# Patient Record
Sex: Female | Born: 1955 | Race: White | Hispanic: No | Marital: Married | Smoking: Never smoker
Health system: Southern US, Community
[De-identification: ages and names within clinical notes are randomized; demographics above are authoritative.]

## PROBLEM LIST (undated history)

## (undated) DIAGNOSIS — E7800 Pure hypercholesterolemia, unspecified: Secondary | ICD-10-CM

## (undated) DIAGNOSIS — N183 Chronic kidney disease, stage 3 unspecified: Secondary | ICD-10-CM

## (undated) DIAGNOSIS — S42309A Unspecified fracture of shaft of humerus, unspecified arm, initial encounter for closed fracture: Secondary | ICD-10-CM

## (undated) DIAGNOSIS — C539 Malignant neoplasm of cervix uteri, unspecified: Secondary | ICD-10-CM

## (undated) DIAGNOSIS — R7309 Other abnormal glucose: Secondary | ICD-10-CM

## (undated) DIAGNOSIS — K449 Diaphragmatic hernia without obstruction or gangrene: Secondary | ICD-10-CM

## (undated) DIAGNOSIS — E0789 Other specified disorders of thyroid: Secondary | ICD-10-CM

## (undated) DIAGNOSIS — T7840XA Allergy, unspecified, initial encounter: Secondary | ICD-10-CM

## (undated) DIAGNOSIS — K2100 Gastro-esophageal reflux disease with esophagitis, without bleeding: Secondary | ICD-10-CM

## (undated) DIAGNOSIS — E559 Vitamin D deficiency, unspecified: Secondary | ICD-10-CM

## (undated) DIAGNOSIS — S92909A Unspecified fracture of unspecified foot, initial encounter for closed fracture: Secondary | ICD-10-CM

## (undated) DIAGNOSIS — G4733 Obstructive sleep apnea (adult) (pediatric): Secondary | ICD-10-CM

## (undated) DIAGNOSIS — G4730 Sleep apnea, unspecified: Secondary | ICD-10-CM

## (undated) DIAGNOSIS — E876 Hypokalemia: Secondary | ICD-10-CM

## (undated) DIAGNOSIS — E041 Nontoxic single thyroid nodule: Secondary | ICD-10-CM

## (undated) DIAGNOSIS — Z9989 Dependence on other enabling machines and devices: Secondary | ICD-10-CM

## (undated) HISTORY — DX: Unspecified fracture of unspecified foot, initial encounter for closed fracture: S92.909A

## (undated) HISTORY — PX: GANGLION CYST EXCISION: SHX1691

## (undated) HISTORY — DX: Other specified disorders of thyroid: E07.89

## (undated) HISTORY — PX: TOTAL VAGINAL HYSTERECTOMY: SHX2548

## (undated) HISTORY — PX: FRACTURE SURGERY: SHX138

## (undated) HISTORY — DX: Hypokalemia: E87.6

## (undated) HISTORY — DX: Gastro-esophageal reflux disease with esophagitis: K21.0

## (undated) HISTORY — DX: Diaphragmatic hernia without obstruction or gangrene: K44.9

## (undated) HISTORY — PX: GALLBLADDER SURGERY: SHX652

## (undated) HISTORY — DX: Nontoxic single thyroid nodule: E04.1

## (undated) HISTORY — DX: Gastro-esophageal reflux disease with esophagitis, without bleeding: K21.00

## (undated) HISTORY — DX: Dependence on other enabling machines and devices: Z99.89

## (undated) HISTORY — DX: Unspecified fracture of shaft of humerus, unspecified arm, initial encounter for closed fracture: S42.309A

## (undated) HISTORY — DX: Other abnormal glucose: R73.09

## (undated) HISTORY — DX: Chronic kidney disease, stage 3 (moderate): N18.3

## (undated) HISTORY — DX: Sleep apnea, unspecified: G47.30

## (undated) HISTORY — PX: ELBOW FRACTURE SURGERY: SHX616

## (undated) HISTORY — DX: Chronic kidney disease, stage 3 unspecified: N18.30

## (undated) HISTORY — DX: Pure hypercholesterolemia, unspecified: E78.00

## (undated) HISTORY — PX: ABDOMINAL HYSTERECTOMY: SHX81

## (undated) HISTORY — DX: Obstructive sleep apnea (adult) (pediatric): G47.33

## (undated) HISTORY — DX: Allergy, unspecified, initial encounter: T78.40XA

## (undated) HISTORY — DX: Vitamin D deficiency, unspecified: E55.9

## (undated) HISTORY — DX: Malignant neoplasm of cervix uteri, unspecified: C53.9

## (undated) HISTORY — PX: CHOLECYSTECTOMY: SHX55

---

## 2000-11-01 ENCOUNTER — Other Ambulatory Visit: Admission: RE | Admit: 2000-11-01 | Discharge: 2000-11-01 | Payer: Self-pay | Admitting: Family Medicine

## 2002-11-10 ENCOUNTER — Emergency Department (HOSPITAL_COMMUNITY): Admission: EM | Admit: 2002-11-10 | Discharge: 2002-11-11 | Payer: Self-pay | Admitting: Emergency Medicine

## 2002-11-11 ENCOUNTER — Encounter: Payer: Self-pay | Admitting: Emergency Medicine

## 2004-04-23 ENCOUNTER — Ambulatory Visit: Payer: Self-pay

## 2005-06-17 ENCOUNTER — Encounter (INDEPENDENT_AMBULATORY_CARE_PROVIDER_SITE_OTHER): Payer: Self-pay | Admitting: Specialist

## 2005-06-17 ENCOUNTER — Ambulatory Visit (HOSPITAL_COMMUNITY): Admission: RE | Admit: 2005-06-17 | Discharge: 2005-06-17 | Payer: Self-pay | Admitting: Gynecology

## 2005-07-15 ENCOUNTER — Encounter (INDEPENDENT_AMBULATORY_CARE_PROVIDER_SITE_OTHER): Payer: Self-pay | Admitting: *Deleted

## 2005-07-15 ENCOUNTER — Observation Stay (HOSPITAL_COMMUNITY): Admission: RE | Admit: 2005-07-15 | Discharge: 2005-07-16 | Payer: Self-pay | Admitting: Gynecology

## 2005-08-15 ENCOUNTER — Ambulatory Visit: Payer: Self-pay | Admitting: Gynecology

## 2007-02-20 ENCOUNTER — Ambulatory Visit: Payer: Self-pay

## 2007-04-17 ENCOUNTER — Ambulatory Visit (HOSPITAL_COMMUNITY): Admission: RE | Admit: 2007-04-17 | Discharge: 2007-04-17 | Payer: Self-pay | Admitting: Cardiovascular Disease

## 2007-09-12 ENCOUNTER — Emergency Department: Payer: Self-pay | Admitting: Emergency Medicine

## 2007-09-19 ENCOUNTER — Ambulatory Visit: Payer: Self-pay | Admitting: Orthopedic Surgery

## 2008-09-23 ENCOUNTER — Ambulatory Visit: Payer: Self-pay | Admitting: Family Medicine

## 2008-09-30 ENCOUNTER — Ambulatory Visit: Payer: Self-pay

## 2008-10-07 ENCOUNTER — Ambulatory Visit: Payer: Self-pay

## 2008-11-13 LAB — HM COLONOSCOPY

## 2008-11-14 ENCOUNTER — Ambulatory Visit: Payer: Self-pay | Admitting: Unknown Physician Specialty

## 2009-06-23 ENCOUNTER — Ambulatory Visit: Payer: Self-pay | Admitting: Family Medicine

## 2010-02-11 IMAGING — NM NM BONE LIMITED
1 series · 8 of 8 positions shown · non-contrast
Comparison: none

REASON FOR EXAM: Injury Tail Bone Pain  Pt Fell  addiontional per
radiologist r/s per incompl...
COMMENTS:

[Series 1000: bone statics · 2.40mm/px · 4 acquisitions, 8 frames shown]
[im 1/4]
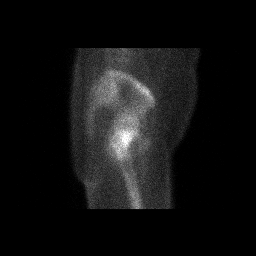
[im 1/4]
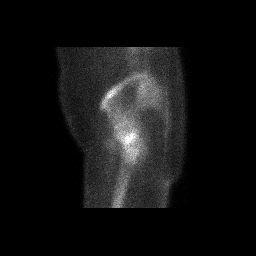
[im 2/4]
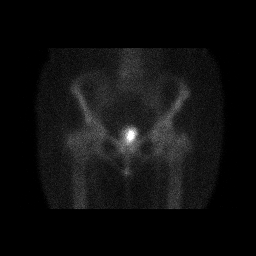
[im 2/4]
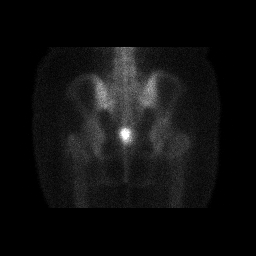
[im 3/4]
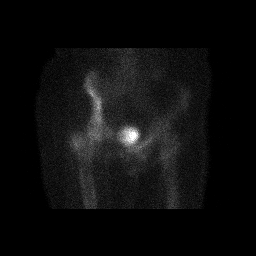
[im 3/4]
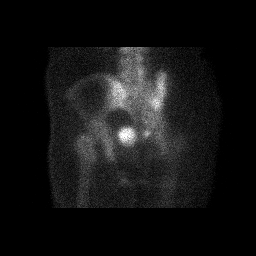
[im 4/4]
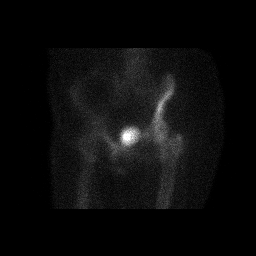
[im 4/4]
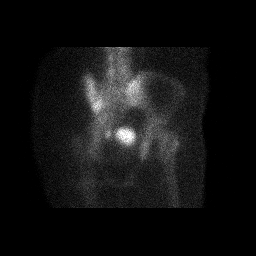

[8 of 8 positions shown; findings below may reference images not displayed]

PROCEDURE:     NM  - NM LIMITED BONE SCAN 3HR [DATE]  [DATE]

RESULT:     Following intravenous administration of 20.34 mCi Technetium 99m
MDP, Limited Bone Scan of the pelvis and sacrum was performed to complete
the initial examination of 09/30/2008. Anterior, posterior, oblique and
lateral views of the sacrum and pelvis were obtained.

No abnormal tracer activity is identified in the pelvis or in the sacrum
which is well demonstrated on the current views.
IMPRESSION: Normal study.

## 2010-02-15 ENCOUNTER — Ambulatory Visit: Payer: Self-pay | Admitting: Family Medicine

## 2010-02-15 ENCOUNTER — Ambulatory Visit: Payer: Self-pay | Admitting: Cardiovascular Disease

## 2010-02-17 ENCOUNTER — Encounter: Payer: Self-pay | Admitting: Cardiovascular Disease

## 2010-02-17 DIAGNOSIS — R609 Edema, unspecified: Secondary | ICD-10-CM | POA: Insufficient documentation

## 2010-02-17 DIAGNOSIS — R0602 Shortness of breath: Secondary | ICD-10-CM | POA: Insufficient documentation

## 2010-02-18 ENCOUNTER — Ambulatory Visit: Payer: Self-pay | Admitting: Cardiovascular Disease

## 2010-02-18 ENCOUNTER — Encounter: Payer: Self-pay | Admitting: Cardiovascular Disease

## 2010-03-06 HISTORY — PX: FOOT SURGERY: SHX648

## 2010-03-26 ENCOUNTER — Ambulatory Visit: Payer: Self-pay | Admitting: Podiatry

## 2010-04-01 ENCOUNTER — Ambulatory Visit: Payer: Self-pay | Admitting: Anesthesiology

## 2010-04-02 ENCOUNTER — Ambulatory Visit: Payer: Self-pay | Admitting: Podiatry

## 2010-06-27 ENCOUNTER — Encounter: Payer: Self-pay | Admitting: Cardiovascular Disease

## 2010-07-06 NOTE — Miscellaneous (Signed)
Summary: Orders Update  Clinical Lists Changes  Problems: Added new problem of EDEMA (ICD-782.3) Added new problem of SHORTNESS OF BREATH (ICD-786.05) Orders: Added new Referral order of Echocardiogram (Echo) - Signed 

## 2010-10-22 NOTE — H&P (Signed)
[name], [name]                ACCOUNT NO.:  [id number]   MEDICAL RECORD NO.:  [id number]          PATIENT TYPE:  AMB   LOCATION:  SDC                           FACILITY:  [facility]   PHYSICIAN:  Ginger Carne, MD  DATE OF BIRTH:  06/11/1955   DATE OF ADMISSION:  DATE OF DISCHARGE:                                HISTORY & PHYSICAL   REASON FOR HOSPITALIZATION:  Carcinoma in situ CIN-3 of cervix.   PROPOSED PROCEDURE:  Total vaginal hysterectomy and bilateral salpingo-  oophorectomy.   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  The patient had a Pap smear  demonstrating evidence for high grade squamous inter epithelial lesion and  endocervical curetting in the office which confirms AGUS.  Because the  patient has had a previous abnormal Pap smear, in December 2005, which  demonstrated suspicion for dysplasia on endocervical curetting, she had  undergone said cold knife cone biopsy.  After extensive discussion with the  patient regarding followup for cold knife cone biopsy as oppose to a  definitive management surgically, the patient has opted for the later.   OBSTETRICAL/GYNECOLOGIC HISTORY:  1.  The patient has had three vaginal deliveries.  2.  One miscarriage, first trimester, in the past.   ALLERGIES:  PENICILLIN.   MEDICAL HISTORY:  Hypertension.   SURGICAL HISTORY:  Cholecystectomy.   MEDICATIONS:  1.  Maxzide 37.5/25 one daily.  2.  Zantac 150 mg p.r.n.   SOCIAL HISTORY:  The patient denies smoking, illicit drug, or alcohol abuse.   FAMILY HISTORY:  No first degree relatives with breast, colon, ovarian, or  uterine carcinoma.  Her father had a myocardial infarction at age 60 and is  deceased.   PHYSICAL EXAMINATION:  GENERAL:  A pleasant female in no acute distress.  VITAL SIGNS:  Blood pressure is 116/70,  height 5 foot 8 inches, weight 202  pounds.  HEENT:  Grossly normal.  CHEST:  Clear to percussion/auscultation.  CARDIOVASCULAR:  Without murmurs or enlargements.  Regular rate and rhythm.  BREAST:  Without masses, discharge, thickenings, or tenderness.  EXTREMITIES/LYMPHATICS/SKIN/NEUROLOGIC/MUSCULOSKELETAL:  Within normal  limits.  ABDOMEN:  Soft without gross hepatosplenomegaly.  PELVIC:  External genitalia, vulva, and vagina normal.  Cervix smooth  without erosions or lesions.  Uterus is small, anteverted, and flexed.  Both  adnexa are palpable, found to be normal.  RECTAL:  Hemoccult negative without masses.   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   PLAN:  The patient was provided options for cold knife cone biopsy of the  cervix with postoperative management including Pap smear and colposcopy  versus a total vaginal hysterectomy.  The patient has no desire for  continued careful evaluation and management and wishes definitive surgery.  She has had a previous history of abnormal Pap smears and wishes not to  continue with aggressive observation and management.  The nature of said  procedure discussed in detail.  Risks including injury to ureter, bowel, and  bladder, possible conversion to a laparoscopic or open procedure, hemorrhage  requiring blood transfusion and infection and other unforeseen complications  were discussed and understood by the said patient.      Ginger Carne, MD  Electronically Signed     SHB/MEDQ  D:  07/12/2005  T:  07/12/2005  Job:  [id number]

## 2010-10-22 NOTE — Op Note (Signed)
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   POSTOPERATIVE DIAGNOSIS:  Cervical intraepithelial neoplasia III/carcinoma  in situ of cervix.   OPERATIVE PROCEDURE:  Total vaginal hysterectomy and bilateral salpingo-  oophorectomy.   SURGEON:  Ginger Carne, M.D.   ASSISTANT:  None.   COMPLICATIONS:  None immediate.   ESTIMATED BLOOD LOSS:  Minimal.   SPECIMEN:  Uterus, cervix, right and left tubes and ovaries.   ANESTHESIA:  General.   OPERATIVE FINDINGS:  The patient had a previous cold knife cone biopsy four  weeks ago.  Remnant sutures were present.  The uterus was small consistent  with menopause.  Both ovaries were atrophic.  Tubes appeared normal.   OPERATIVE PROCEDURE:  The patient prepped in the usual fashion and placed in  lithotomy position.  Betadine solution used for antiseptic and the patient  was catheterized prior to the procedure.  After adequate general anesthesia,  a double-tooth tenaculum placed on the anterior and posterior lips of the  cervix.  Marcaine with epinephrine 0.5%/1:200,000 was injected  circumferentially around the cervix for approximately 20 mL.  Afterwards,  the anterior and posterior vaginal epithelium were incised transversely, the  peritoneal reflections identified and opened without injury to their  respective organs.  The uterosacral-cardinal ligament complexes were  clamped, cut and ligated with 0 Vicryl suture and affixed to their  respective vaginal walls.  The uterine vasculature with its ascending  branches was similarly clamped,  cut and ligated with 0 Vicryl suture.  Broad  ligaments also clamped, cut and ligated and eventually the infundibulopelvic  ligaments on either side including ovaries were clamped, cut and ligated  with 0 Vicryl suture.  Double fixation 0 Vicryl sutures placed twice around  the IP ligaments.  Following this,  bleeding points hemostatically checked, clots removed.  Closure of the cuff  in one layer with 0 Vicryl running interlocking suture.  The patient  tolerated the procedure well and returned to the post anesthesia recovery  room in excellent condition.      Ginger Carne, MD  Electronically Signed     SHB/MEDQ  D:  07/15/2005  T:  07/15/2005  Job:  [id number]

## 2010-10-22 NOTE — Op Note (Signed)
NAMEALDENA, WORM                ACCOUNT NO.:  [id number]   MEDICAL RECORD NO.:  [id number]          PATIENT TYPE:  AMB   LOCATION:  SDC                           [facility]ACILITY:  [facility]   PHYSICIAN:  Ginger Carne, MD  DATE OF BIRTH:  [birth year]   DATE OF PROCEDURE:  06/17/2005  DATE OF DISCHARGE:                                 OPERATIVE REPORT   PREOPERATIVE DIAGNOSIS:  Positive endocervical curettings with high-grade  squamous intraepithelial lesion on colposcopy.   POSTOPERATIVE DIAGNOSIS:  Positive endocervical curettings with high-grade  squamous intraepithelial lesion on colposcopy.   PROCEDURE:  Cold knife cone biopsy of cervix.   SURGEON:  Ginger Carne, M.D.   ASSISTANT:  None.   COMPLICATIONS:  None immediate.   ESTIMATED BLOOD LOSS:  Minimal.   SPECIMENS:  Cervical cone.   ANESTHESIA:  General with local.   OPERATIVE FINDINGS:  The cervix was smooth without external erosions or  lesions.  An adequate cone biopsy was obtained.   OPERATIVE PROCEDURE:  The patient prepped and draped in the usual fashion  and placed in the lithotomy position, Betadine solution used for antiseptic,  and the patient was catheterized prior to the procedure after an adequate  general anesthesia.  A tenaculum placed on the anterior lip of the cervix.  Marcaine with epinephrine 25 mL utilized paracervically.  Afterwards a cold  knife cone biopsy was performed and placed in formalin and sent to  pathology.  Following this a series of four 0 Vicryl sutures were placed at  the 12, 6, 9 and 1 o'clock positions, straddling either side.  Bleeding  points hemostatically checked.  Copious irrigation with lactated Ringer's  used before and after procedure.  The patient returned to the postanesthesia  recovery room in excellent condition.      Ginger Carne, MD  Electronically Signed     SHB/MEDQ  D:  06/17/2005  T:  06/17/2005  Job:  [id number]

## 2010-10-22 NOTE — Discharge Summary (Signed)
[name], [name]                ACCOUNT NO.:  [id number]   MEDICAL RECORD NO.:  [id number]          PATIENT TYPE:  OBV   LOCATION:  9305                          [facility]ACILITY:  [facility]   PHYSICIAN:  Ginger Carne, MD  DATE OF BIRTH:  06-Mar-1956   DATE OF ADMISSION:  07/15/2005  DATE OF DISCHARGE:  07/16/2005                                 DISCHARGE SUMMARY   REASON FOR HOSPITALIZATION:  Carcinoma in situ of cervix/CIN 3 of cervix.   IN HOSPITAL PROCEDURES:  Total vaginal hysterectomy and bilateral salpingo-  oophorectomy.   FINAL DIAGNOSIS:  Carcinoma in situ of cervix/CIN 3 of cervix.   HOSPITAL COURSE:  This patient is a 55 year old Caucasian female who was  diagnosed with the aforementioned findings based on a cold knife cone biopsy  of the cervix.  The patient is menopausal and desired removal of said  ovaries.  The patient underwent surgery on July 15, 2005.   Postoperative course was uneventful.  She was afebrile.  Voided well.  Calves without tenderness.  Lungs were clear. Scant vaginal flow.  Postoperative H&H was 33.4 and 11.2.  She was discharged with the routine  postoperative instructions including contacting the office for temperature  elevation above 100.4 degrees Fahrenheit, increasing abdominal pain, vaginal  bleeding, genitourinary or gastrointestinal abnormalities.  The patient was  discharged with Percocet 5/325, 1 to 2 every 4-6 hours.  Will be rechecked  in 4 weeks and will continue medications prescribed preoperatively.      Ginger Carne, MD  Electronically Signed     SHB/MEDQ  D:  07/16/2005  T:  07/16/2005  Job:  [id number]

## 2011-01-06 ENCOUNTER — Ambulatory Visit: Payer: Self-pay | Admitting: Family Medicine

## 2011-06-07 DIAGNOSIS — S92909A Unspecified fracture of unspecified foot, initial encounter for closed fracture: Secondary | ICD-10-CM

## 2011-06-07 HISTORY — DX: Unspecified fracture of unspecified foot, initial encounter for closed fracture: S92.909A

## 2012-04-25 ENCOUNTER — Ambulatory Visit: Payer: Self-pay | Admitting: Family Medicine

## 2012-04-25 LAB — HM HEPATITIS C SCREENING LAB: HM Hepatitis Screen: NEGATIVE

## 2012-05-02 ENCOUNTER — Ambulatory Visit: Payer: Self-pay | Admitting: Family Medicine

## 2012-05-02 ENCOUNTER — Ambulatory Visit: Payer: Self-pay | Admitting: Nephrology

## 2012-06-11 ENCOUNTER — Ambulatory Visit (INDEPENDENT_AMBULATORY_CARE_PROVIDER_SITE_OTHER): Payer: 59 | Admitting: Cardiovascular Disease

## 2012-06-11 ENCOUNTER — Encounter: Payer: Self-pay | Admitting: Cardiovascular Disease

## 2012-06-11 VITALS — BP 139/72 | HR 64 | Ht 68.0 in | Wt 224.0 lb

## 2012-06-11 DIAGNOSIS — E785 Hyperlipidemia, unspecified: Secondary | ICD-10-CM

## 2012-06-11 DIAGNOSIS — Z0000 Encounter for general adult medical examination without abnormal findings: Secondary | ICD-10-CM

## 2012-06-11 DIAGNOSIS — R9431 Abnormal electrocardiogram [ECG] [EKG]: Secondary | ICD-10-CM

## 2012-06-11 NOTE — Progress Notes (Signed)
Patient ID: [name], female    DOB: 10-19-1955, 57 y.o.   MRN: [id number]  HPI Comments: [name] is a very pleasant 57 year old woman with history of thyroid nodule, prior history of chest pain, shortness of breath, abnormal stress test, strong family history of coronary artery disease, obesity who presents to establish care in our office.  Prior cardiac CT scan November 2008 showing coronary calcium score of 0, no significant coronary artery disease, normal LV and RV size and systolic function  Prior echocardiogram September 2011 was essentially normal with ejection fraction greater than 55%, normal right ventricular systolic pressure  She reports that she has a chronic problem with edema. She takes Lasix daily with improvement of her symptoms. Her cholesterol has been high but she has been reluctant to take medication. She was told that her EKG was abnormal and she needed further followup to make sure everything have not changed in the past several years. She does not exercise on a regular basis. She has been battling with her weight.   LFTs are mildly elevated by notes from primary care recent lab work shows total cholesterol 224, LDL 125, HDL 78, vitamin D 23, hemoglobin A1c 5.9  EKG today shows normal sinus rhythm with rate 64 beats per minute with poor R-wave progression through the anterior precordial leads Prior EKGs are essentially unchanged. We have EKG from 05/22/2012, 03/26/2010, August 2008, April 2000 No significant change noted     Outpatient Encounter Prescriptions as of 06/11/2012  Medication Sig Dispense Refill  . Cholecalciferol (VITAMIN D PO) Take 1.25 mg by mouth once a week.      . furosemide (LASIX) 40 MG tablet Take 40 mg by mouth daily.      . Hydrocodone-Acetaminophen 5-300 MG TABS Take as needed for foot fracture.      Marland Kitchen levothyroxine (SYNTHROID, LEVOTHROID) 25 MCG tablet Take 25 mcg by mouth daily.      . metolazone (ZAROXOLYN) 5 MG tablet Take 5 mg by  mouth as needed.      . NON FORMULARY CPAP nightly.      . Omega-3 Fatty Acids (FISH OIL) 1200 MG CAPS Take 2 capsules twice a day.      . potassium chloride SA (K-DUR,KLOR-CON) 20 MEQ tablet Take 20 mEq by mouth daily.      . psyllium (REGULOID) 0.52 G capsule Take 0.52 g by mouth daily.      . ranitidine (ZANTAC) 150 MG capsule Take 150 mg by mouth daily as needed.      . [DISCONTINUED] potassium chloride (K-DUR) 10 MEQ tablet Take 10 mEq by mouth daily.        Review of Systems  Constitutional: Negative.   HENT: Negative.   Eyes: Negative.   Respiratory: Negative.   Cardiovascular: Negative.   Gastrointestinal: Negative.   Musculoskeletal: Negative.   Skin: Negative.   Neurological: Negative.   Hematological: Negative.   Psychiatric/Behavioral: Negative.   All other systems reviewed and are negative.     BP 139/72  Pulse 64  Ht 5\' 8"  (1.727 m)  Wt 224 lb (101.606 kg)  BMI 34.06 kg/m2  Physical Exam  Nursing note and vitals reviewed. Constitutional: She is oriented to person, place, and time. She appears well-developed and well-nourished.       Obese  HENT:  Head: Normocephalic.  Nose: Nose normal.  Mouth/Throat: Oropharynx is clear and moist.  Eyes: Conjunctivae normal are normal. Pupils are equal, round, and reactive to light.  Neck:  Normal range of motion. Neck supple. No JVD present.  Cardiovascular: Normal rate, regular rhythm, S1 normal, S2 normal, normal heart sounds and intact distal pulses.  Exam reveals no gallop and no friction rub.   No murmur heard. Pulmonary/Chest: Effort normal and breath sounds normal. No respiratory distress. She has no wheezes. She has no rales. She exhibits no tenderness.  Abdominal: Soft. Bowel sounds are normal. She exhibits no distension. There is no tenderness.  Musculoskeletal: Normal range of motion. She exhibits no edema and no tenderness.  Lymphadenopathy:    She has no cervical adenopathy.  Neurological: She is alert and  oriented to person, place, and time. Coordination normal.  Skin: Skin is warm and dry. No rash noted. No erythema.  Psychiatric: She has a normal mood and affect. Her behavior is normal. Judgment and thought content normal.         Assessment and Plan

## 2012-06-11 NOTE — Patient Instructions (Addendum)
You are doing well. No medication changes were made.  Try Red Yeast Rice for cholesterol   Please call us if you have new issues that need to be addressed before your next appt.  Your physician wants you to follow-up in: 12 months.  You will receive a reminder letter in the mail two months in advance. If you don't receive a letter, please call our office to schedule the follow-up appointment.

## 2012-06-11 NOTE — Assessment & Plan Note (Signed)
She does not want to start a statin at this time. We have recommended she lose weight after her right foot fracture has improved. She could try red yeast rice.

## 2012-06-11 NOTE — Assessment & Plan Note (Signed)
Review of her EKGs shows no significant change over the past 13 years. No further workup at this time. She has had prior cardiac CT scan showing no significant disease in 2008.

## 2012-07-11 ENCOUNTER — Ambulatory Visit: Payer: Self-pay | Admitting: Family Medicine

## 2012-07-21 ENCOUNTER — Other Ambulatory Visit: Payer: Self-pay

## 2012-07-24 ENCOUNTER — Ambulatory Visit: Payer: Self-pay

## 2013-04-11 ENCOUNTER — Other Ambulatory Visit: Payer: Self-pay

## 2013-07-23 ENCOUNTER — Ambulatory Visit: Payer: 59 | Admitting: Cardiovascular Disease

## 2013-07-30 ENCOUNTER — Ambulatory Visit: Payer: Self-pay | Admitting: Cardiovascular Disease

## 2013-08-15 ENCOUNTER — Ambulatory Visit: Payer: Self-pay | Admitting: Cardiovascular Disease

## 2013-08-20 ENCOUNTER — Ambulatory Visit: Payer: Self-pay | Admitting: Cardiovascular Disease

## 2013-09-06 IMAGING — US THYROID ULTRASOUND
1 series · 13 of 25 positions shown · non-contrast
Comparison: None

REASON FOR EXAM: Thyroid Fullness
COMMENTS:

PROCEDURE:     US  - US SOFT TISSUE HEAD/NECK/THYROID  - May 02, 2012 [DATE]
RESULT:     Indication: Thyroid fullness
TECHNIQUE: Multiple gray-scale and color-flow Doppler images of the thyroid
gland are presented for review.

[Series 1: thyroid ultrasound · 0.08mm/px · 13 of 65 slices shown]
[im 1/65]
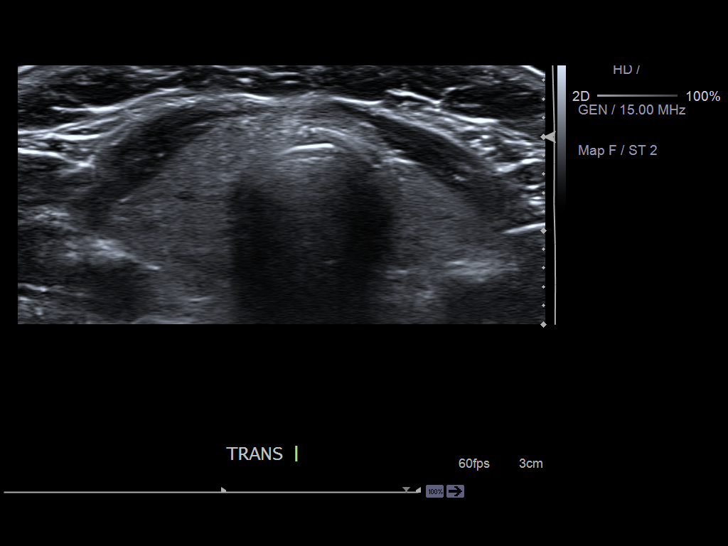
[im 6/65]
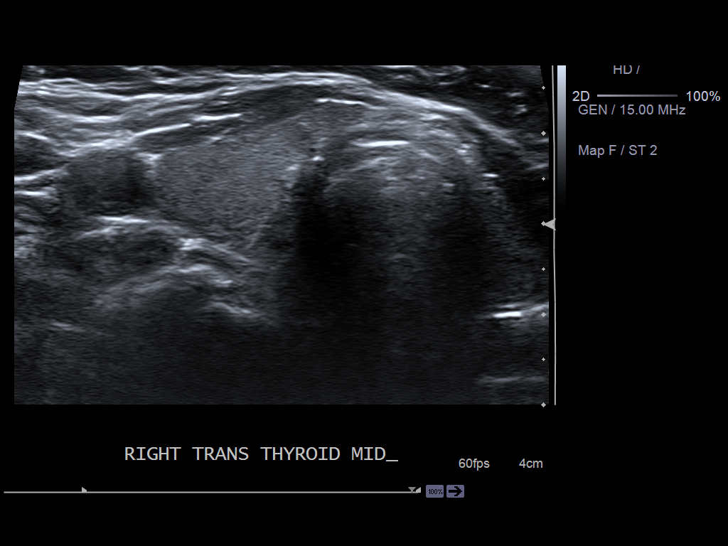
[im 11/65]
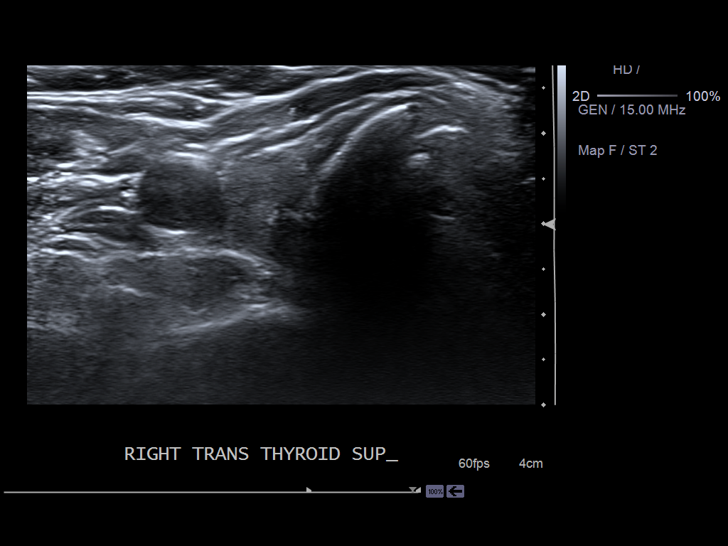
[im 17/65]
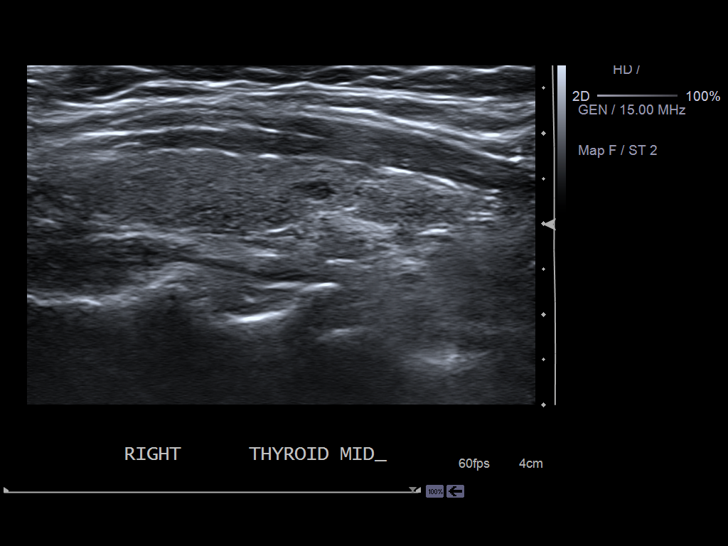
[im 22/65]
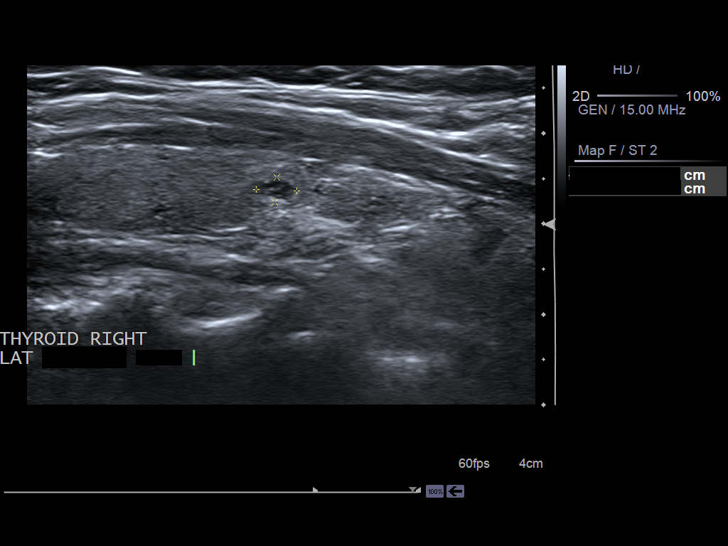
[im 27/65]
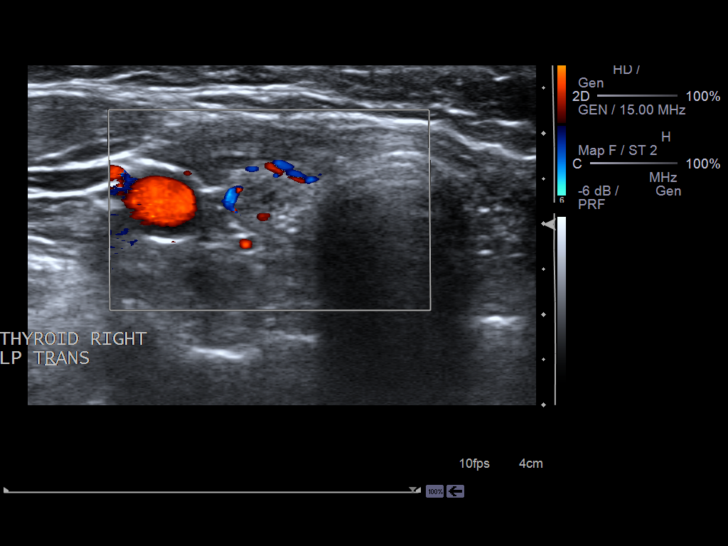
[im 33/65]
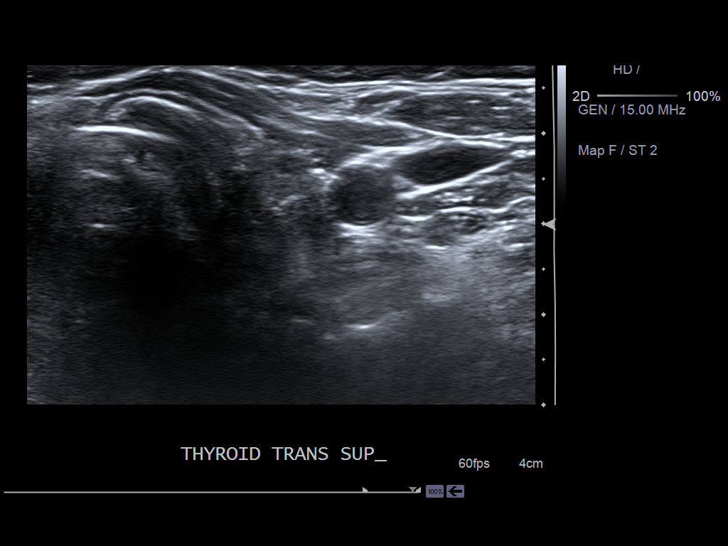
[im 38/65]
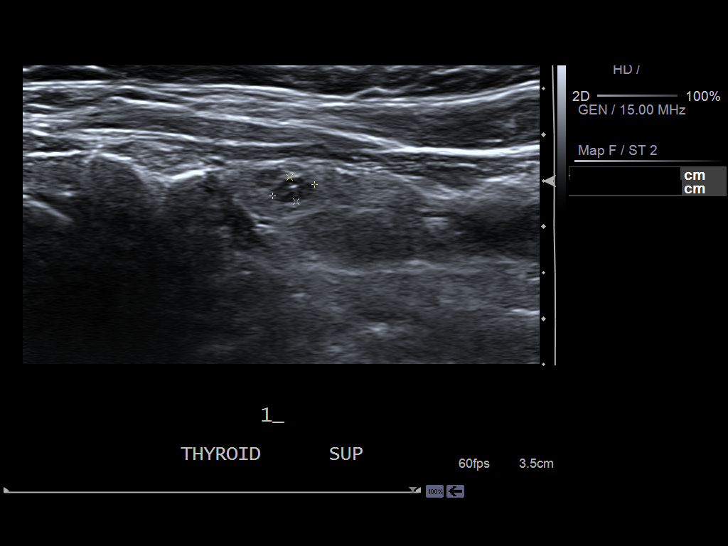
[im 43/65]
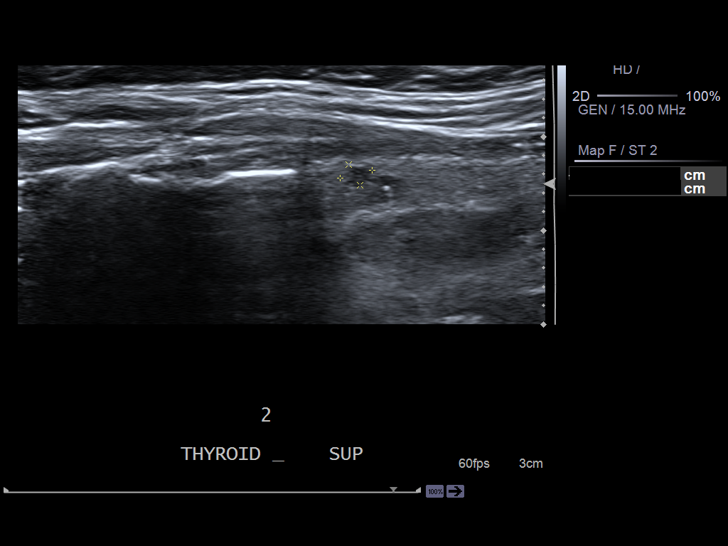
[im 49/65]
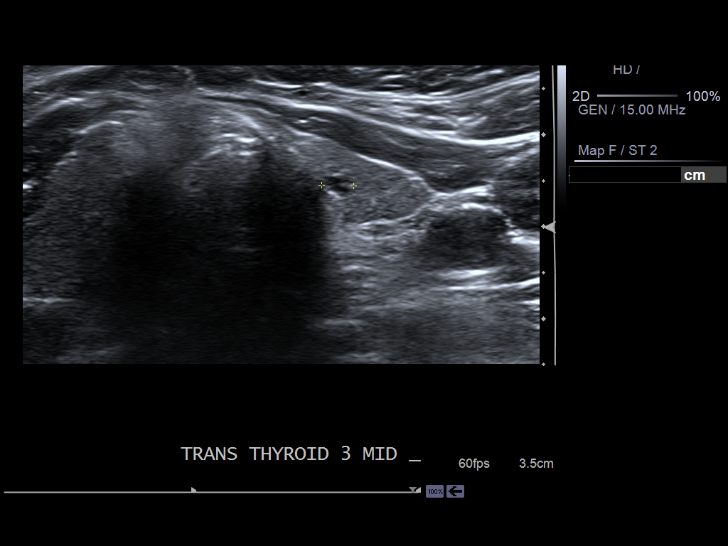
[im 54/65]
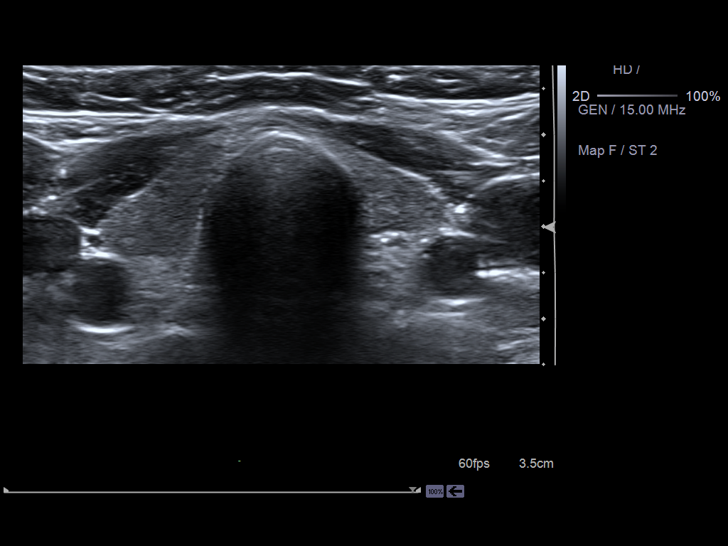
[im 59/65]
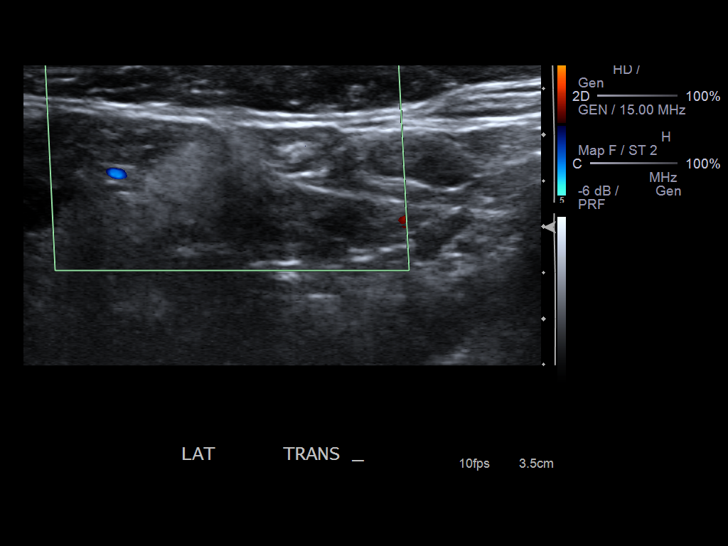
[im 65/65]
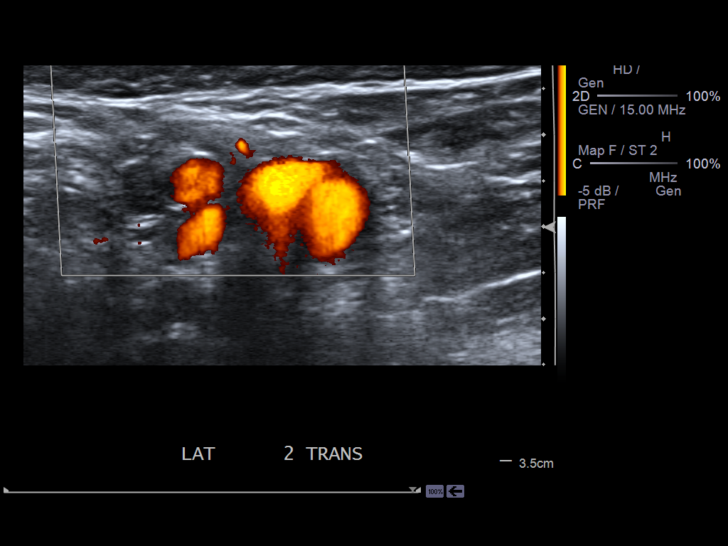

[13 of 25 positions shown; findings below may reference images not displayed]

FINDINGS: The right lobe of the thyroid gland measures 5 x 1.2 x 1.7 cm. The right
thyroid demonstrates normal echogenicity. There are 2 mildly complex
hypoechoic right thyroid nodules measuring 5 x 3 x 4 mm and 6 x 4 x 6 mm
collectively in the inferior aspect of the right thyroid lobe.

The left lobe of the thyroid gland measures 3.6 x 1.3 x 1.1 cm. The left
thyroid demonstrates normal echogenicity. There are 2 hypoechoic nodules in
the left thyroid lobe superiorly measuring 5 x 3 x 5 mm and 4 x 3 x 3 mm
respectively. There is a small hypoechoic nodule in the mid left thyroid
gland measuring 4 x 3 x 4 mm.

The thyroid isthmus is normal measuring 2.3 mm.

Incidentally noted are a few nonpathologically enlarged left cervical lymph
nodes.
IMPRESSION: Multiple subcentimeter thyroid nodules bilaterally. Recommend followup
thyroid ultrasound in 6 months. Correlate with thyroid function tests.

There are no sensitive or specific ultrasound criteria for differentiating
benign from malignant thyroid lesions.

[REDACTED]

## 2013-09-06 IMAGING — CR RIGHT ANKLE - COMPLETE 3+ VIEW
1 series · 4 of 4 positions shown · non-contrast
Comparison: none

REASON FOR EXAM: pain
COMMENTS:

PROCEDURE:     KDR - KDXR ANKLE RIGHT COMPLETE  - May 02, 2012 [DATE]
RESULT:     Comparison: None

[Series 1: ap · 0.17mm/px · 4 of 4 slices shown]
[im 1/4]
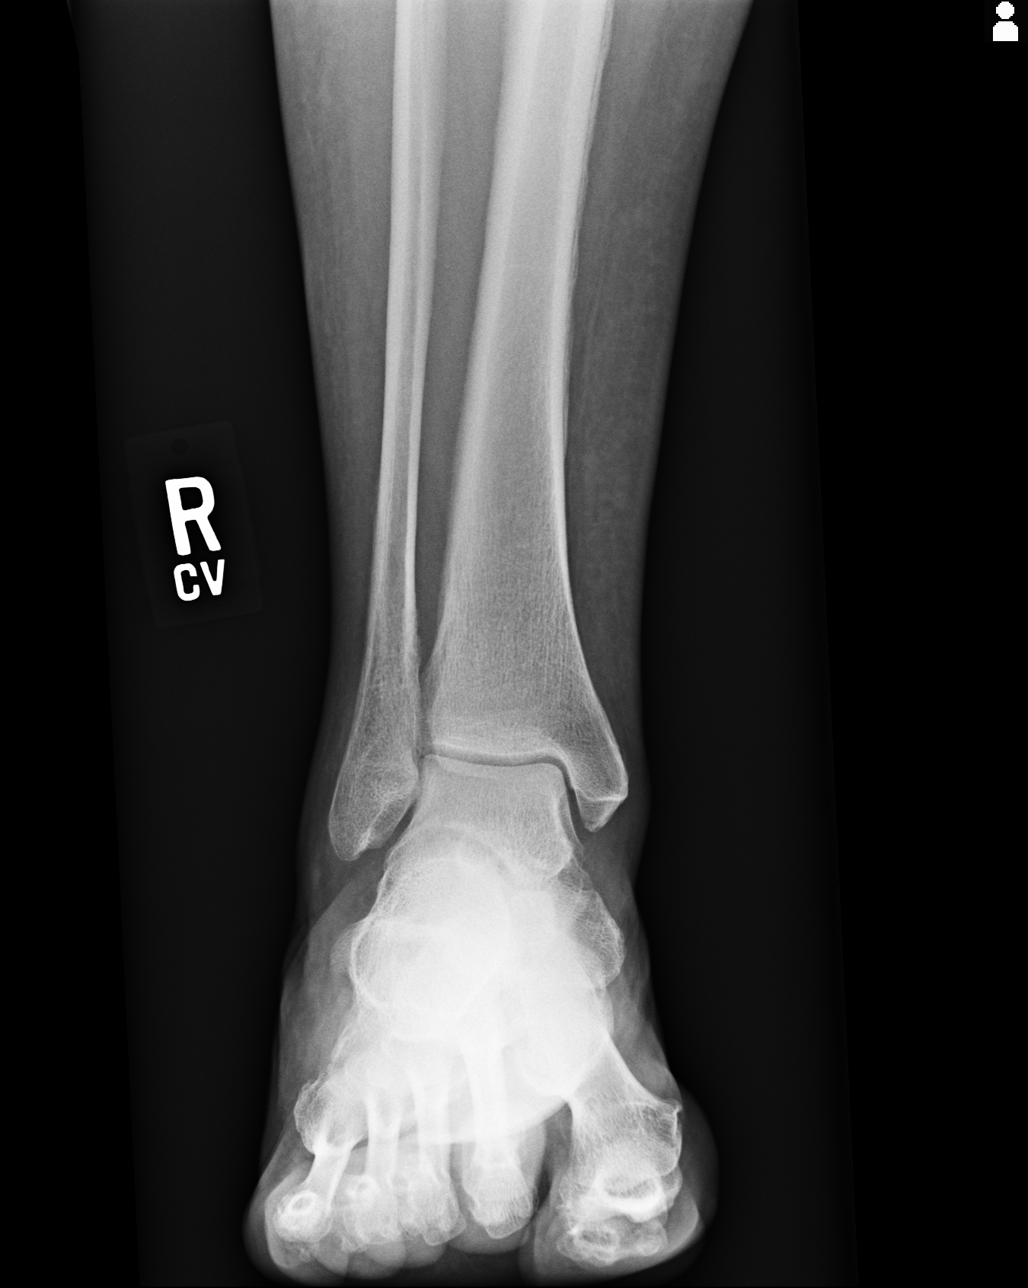
[im 2/4]
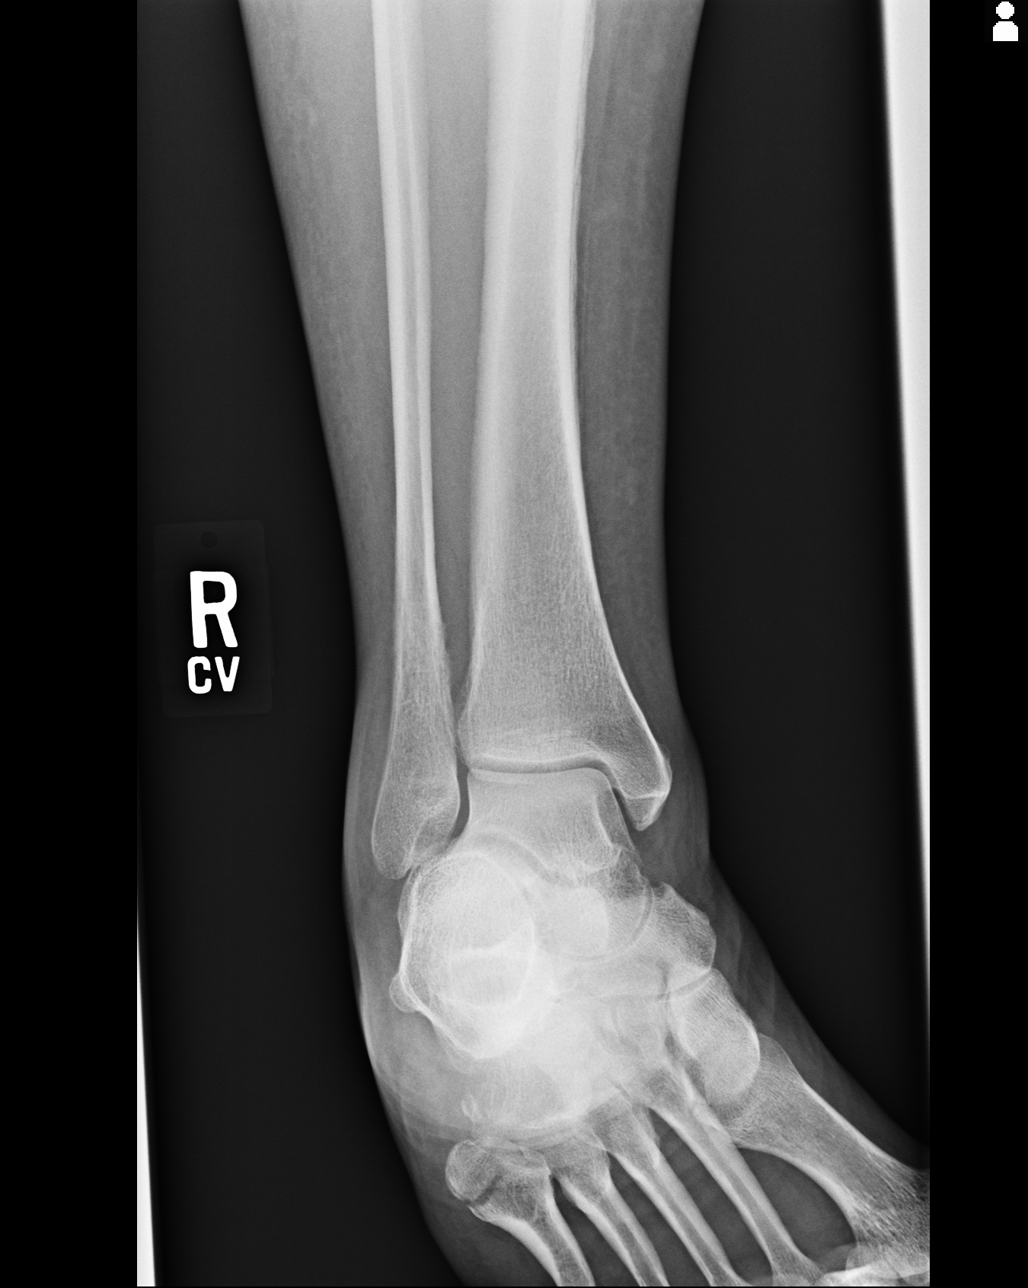
[im 3/4]
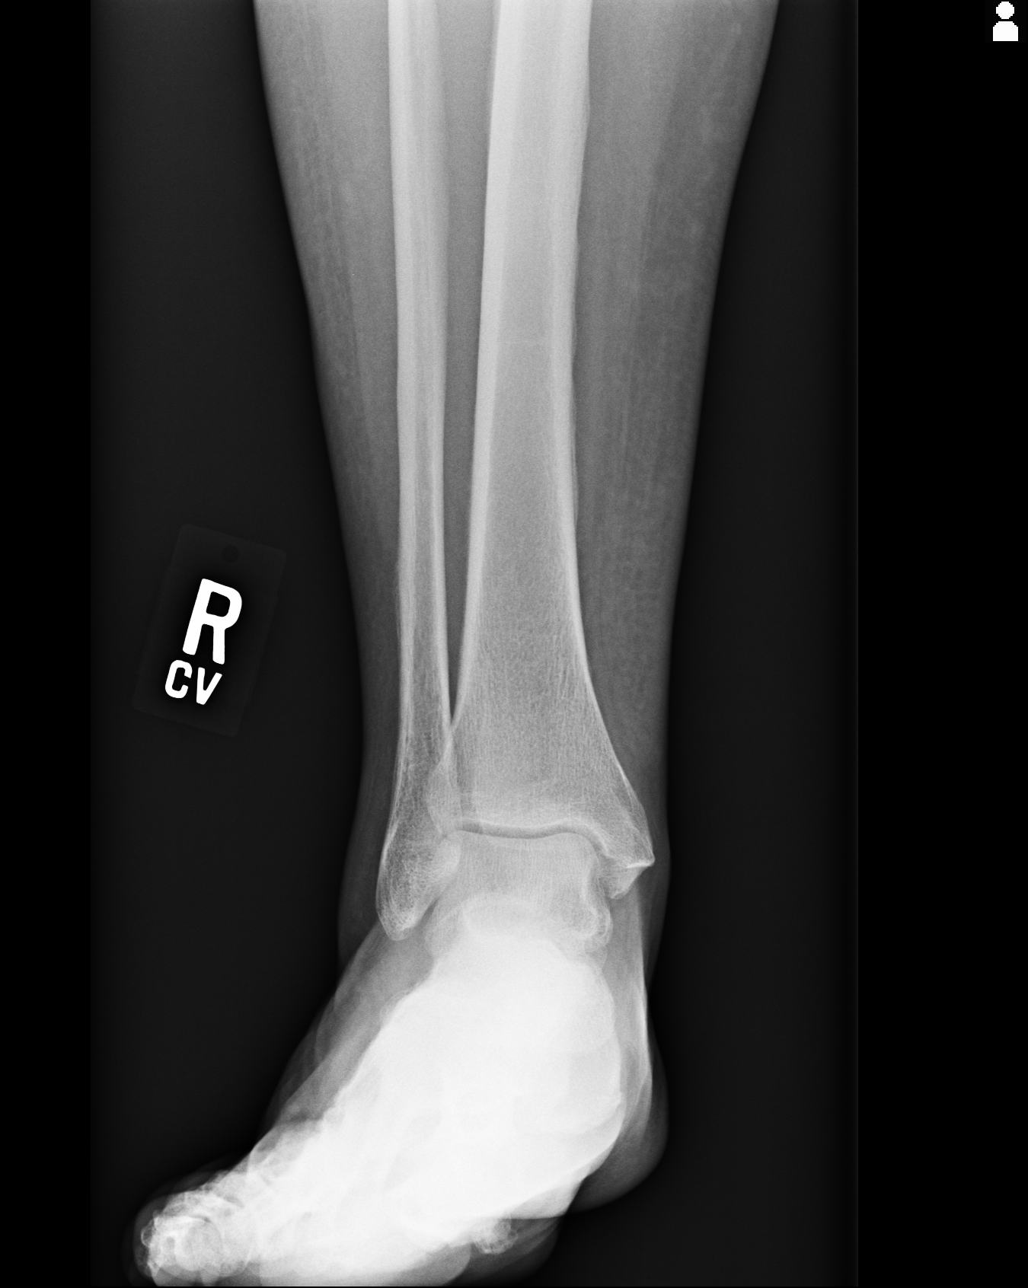
[im 4/4]
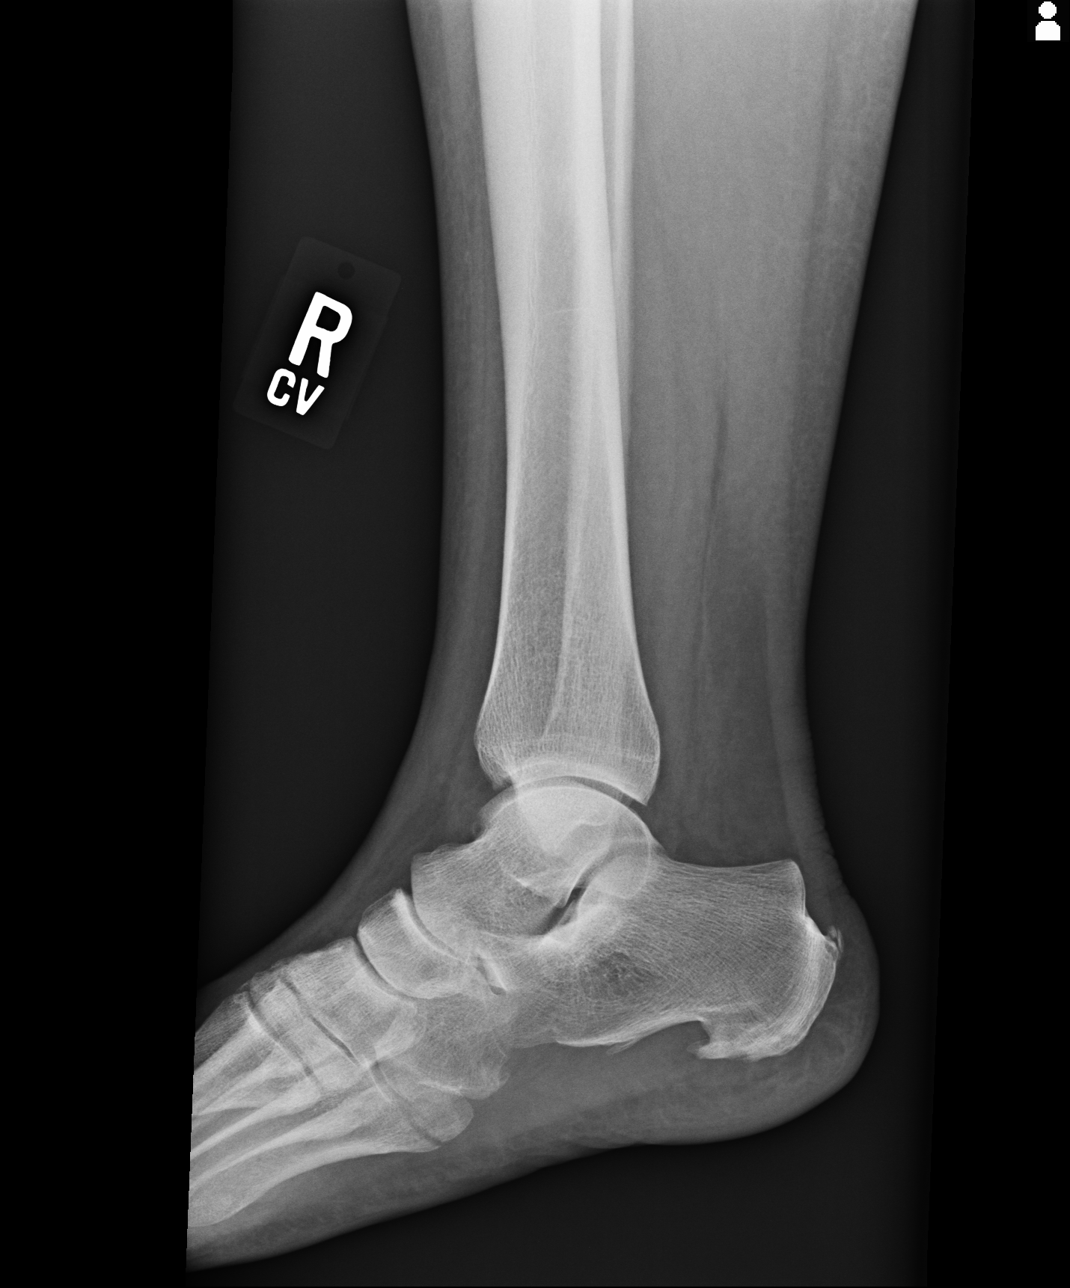

[4 of 4 positions shown; findings below may reference images not displayed]

FINDINGS: 4 views of the right ankle demonstrate a nondisplaced fracture of the base
of the right fifth metatarsal extending to the cubometatarsal articulation.
There is as the plane is. There is a plantar calcaneal spur. There ankle
mortise is intact. There is no significant joint effusion.
IMPRESSION: Nondisplaced fracture of the base of the right fifth metatarsal. Recommend
orthopedic consultation.

[REDACTED]

## 2013-11-15 IMAGING — MG MM CAD SCREENING MAMMO
1 series · 4 of 4 positions shown · non-contrast
Comparison: 01/06/2011, 02/20/2007, 04/23/2004.

REASON FOR EXAM: SCR MAMMO NO ORDER AND MENOPAUSE
COMMENTS:

PROCEDURE:     MAM - MAM DGTL SCRN MAM NO ORDER W/CAD  - July 11, 2012  [DATE]
RESULT:

[R CC · right · 4 of 4 slices shown]
[im 1/4]
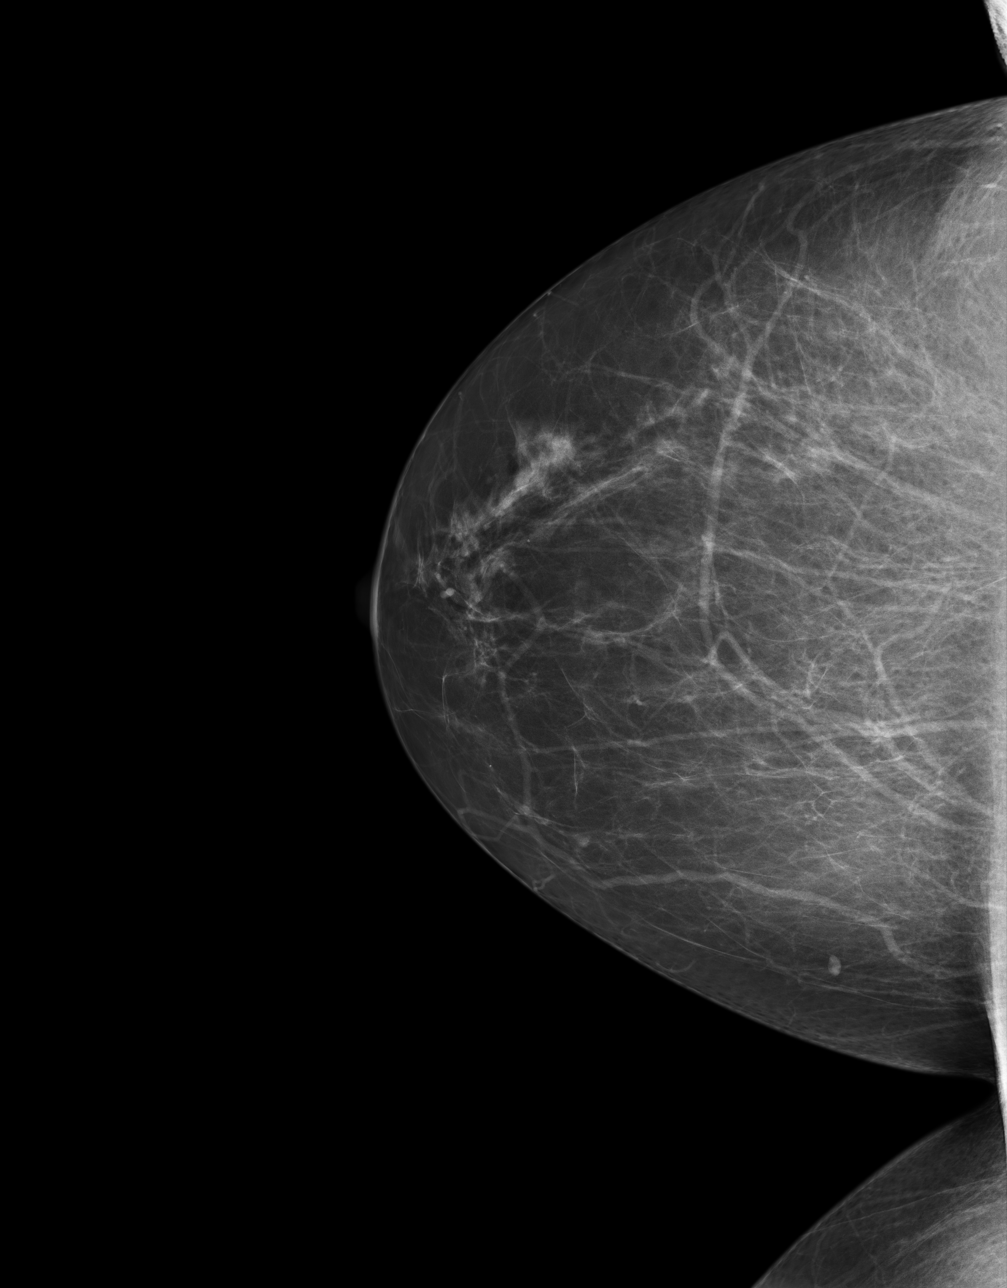
[im 2/4]
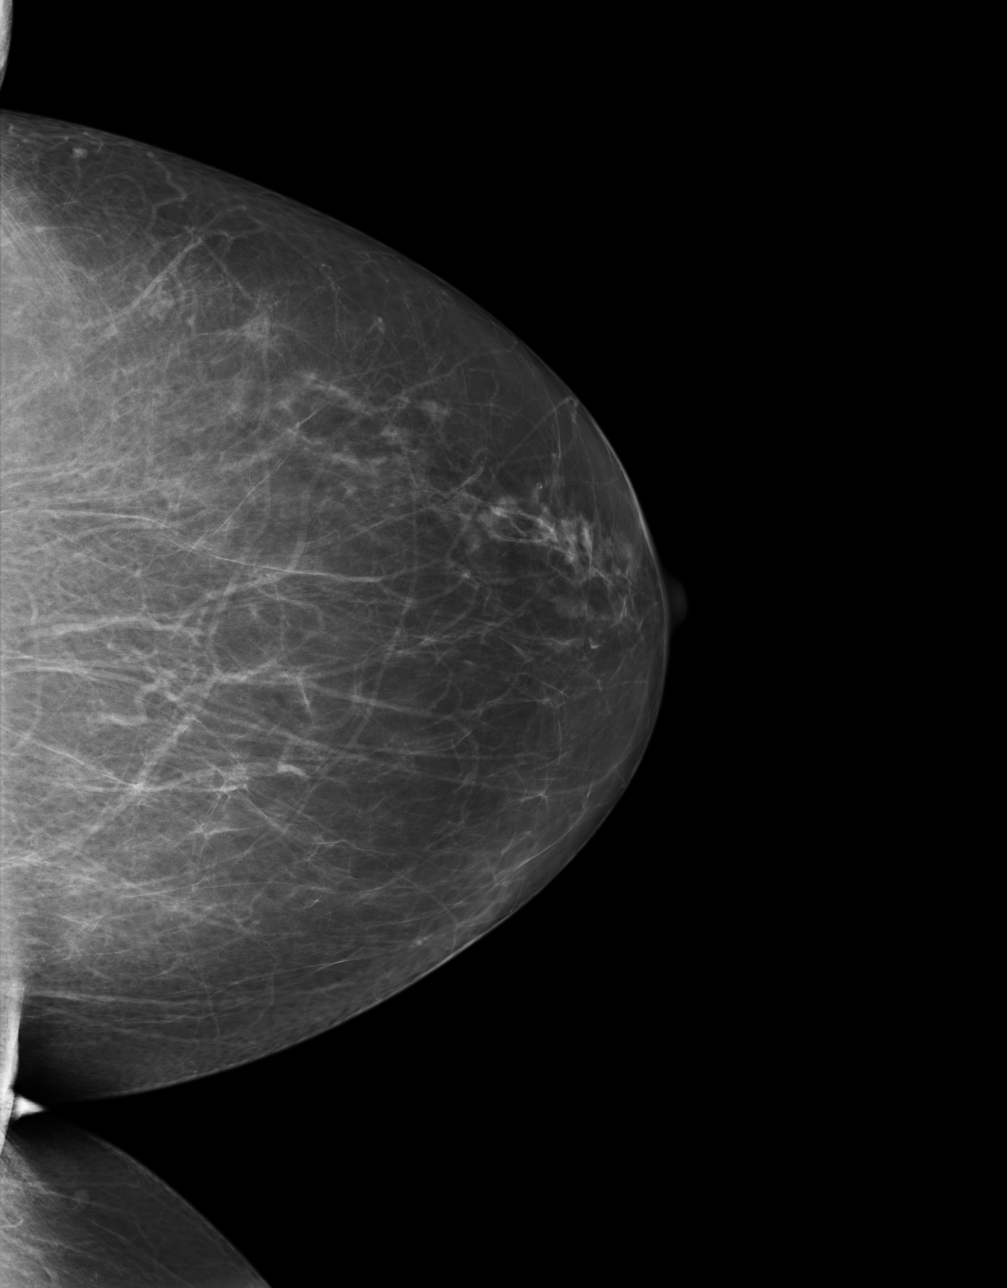
[im 3/4]
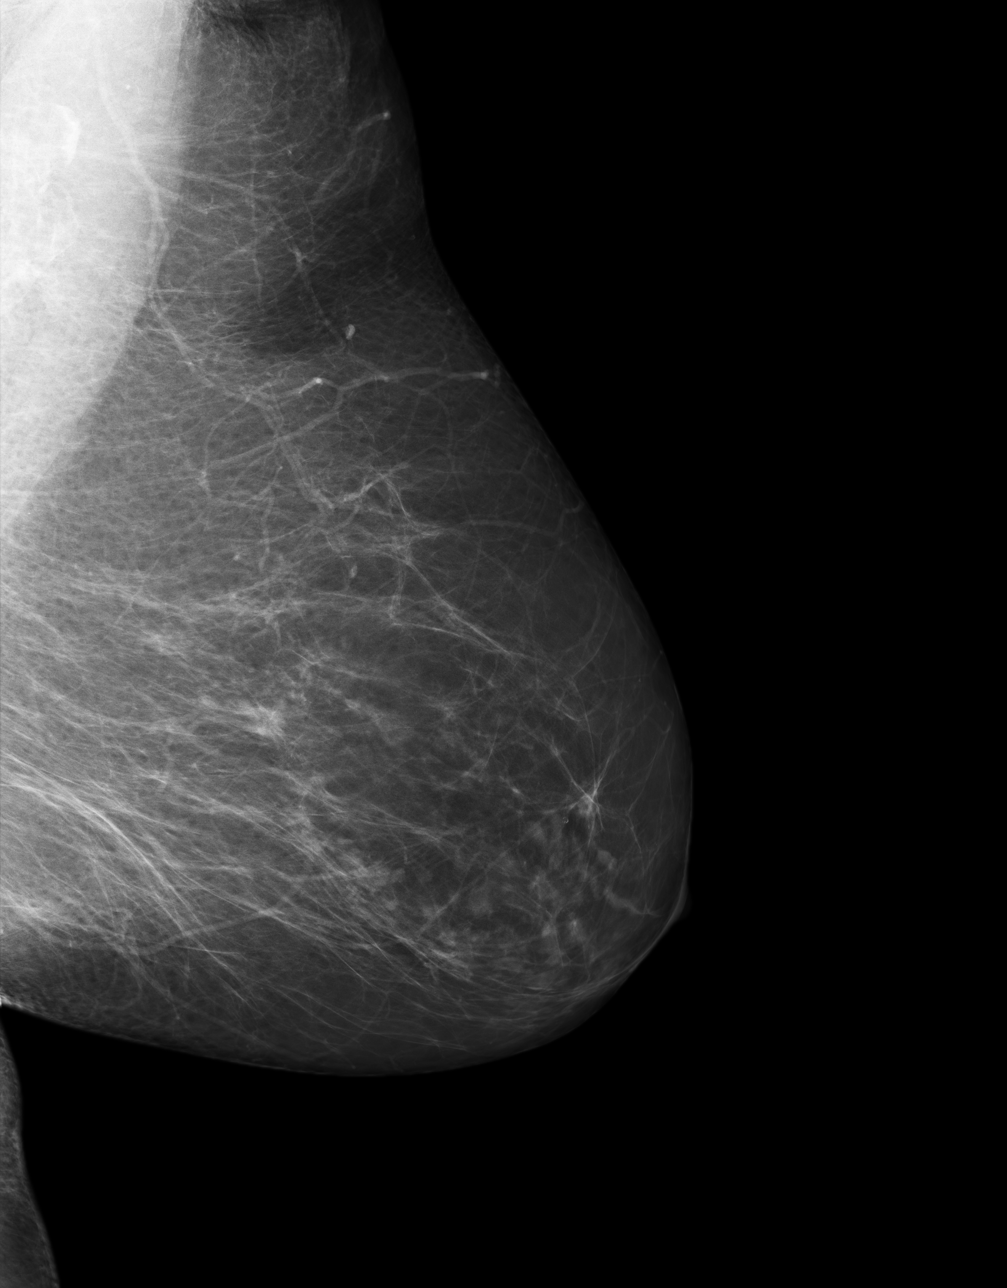
[im 4/4]
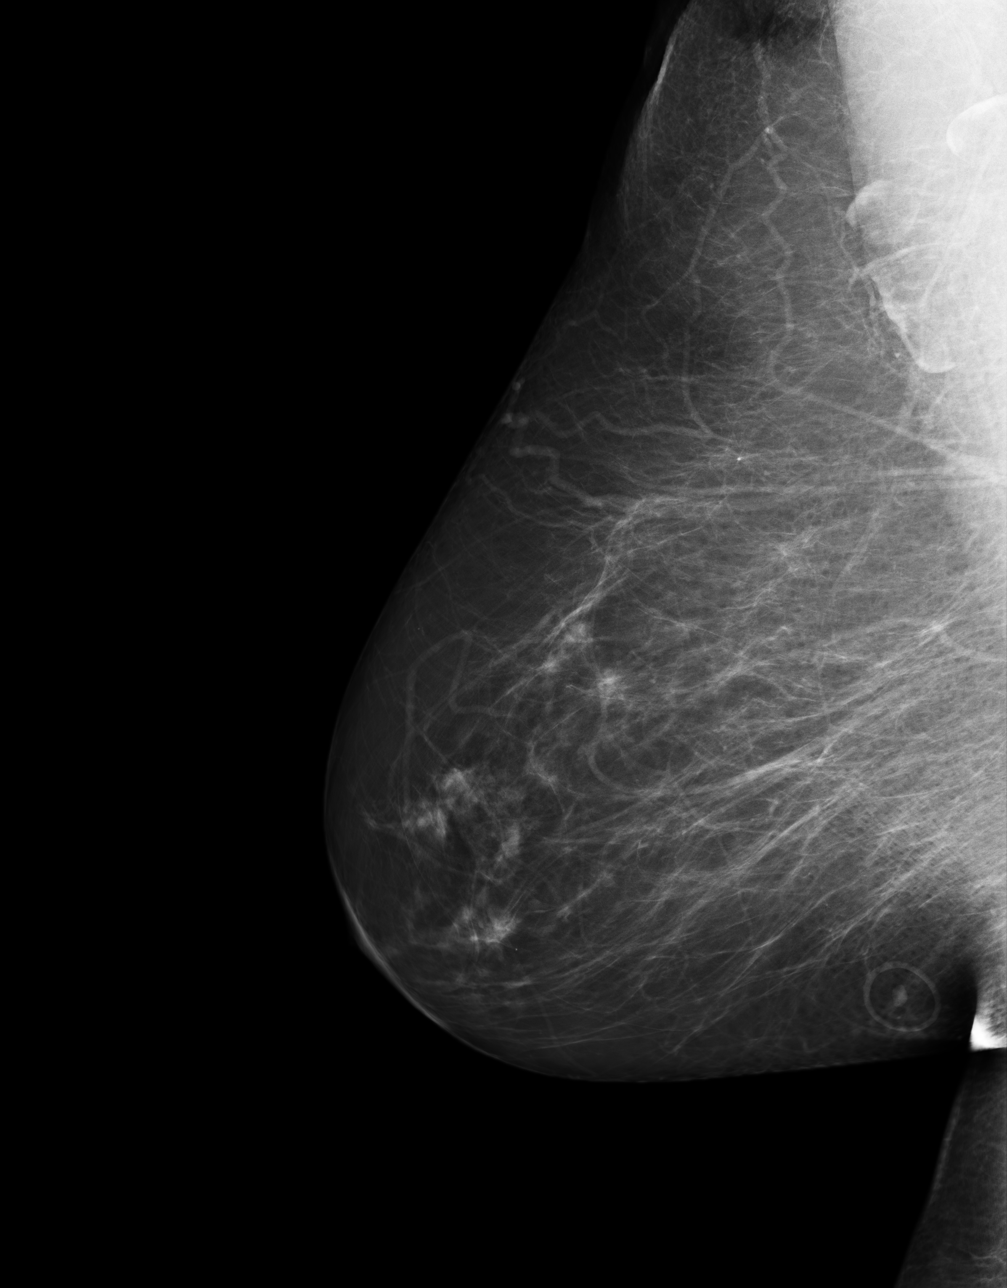

[4 of 4 positions shown; findings below may reference images not displayed]

FINDINGS: There is scattered fibroglandular tissue. No suspicious masses or
calcifications are identified. No areas of architectural distortion.
IMPRESSION: 1.     BI-RADS: Category 1 Negative.
2.     Recommend continued annual screening mammography.

BREAST COMPOSITION: The breast composition is SCATTERED FIBROGLANDULAR
TISSUE (glandular tissue is 25-50%).

[REDACTED]

Thank you for this opportunity to contribute to the care of your patient.

A NEGATIVE MAMMOGRAM REPORT DOES NOT PRECLUDE BIOPSY OR OTHER EVALUATION OF
A CLINICALLY PALPABLE OR OTHERWISE SUSPICIOUS MASS OR LESION. BREAST CANCER
MAY NOT BE DETECTED BY MAMMOGRAPHY IN UP TO 10% OF CASES.

## 2013-12-30 ENCOUNTER — Ambulatory Visit: Payer: Self-pay | Admitting: Podiatry

## 2014-03-21 ENCOUNTER — Other Ambulatory Visit: Payer: Self-pay

## 2014-11-28 ENCOUNTER — Telehealth: Payer: Self-pay | Admitting: Family Medicine

## 2014-11-28 MED ORDER — POTASSIUM CHLORIDE CRYS ER 20 MEQ PO TBCR: 20.0000 meq | EXTENDED_RELEASE_TABLET | Freq: Every day | ORAL | Status: DC

## 2014-11-28 NOTE — Telephone Encounter (Signed)
This is a Dr. Rosanna Randy pt. Pt contacted office for refill request on the following medications: potassium chloride SA (K-DUR,KLOR-CON) 20 MEQ tablet  Pt stated that the pharmacy told her they have requested a refill twice on this medication and haven't heard back from our office. Pt would like this sent to [facility]. It was last written on 07/28/14 and last OV was 08/11/14. Pt was advised that Dr. Rosanna Randy is out of the office until 12/09/14 and that the request would be sent to Dr. Caryn Section. Thanks TNP

## 2015-01-26 ENCOUNTER — Other Ambulatory Visit: Payer: Self-pay | Admitting: Family Medicine

## 2015-01-27 ENCOUNTER — Other Ambulatory Visit: Payer: Self-pay | Admitting: Family Medicine

## 2015-01-28 ENCOUNTER — Ambulatory Visit (INDEPENDENT_AMBULATORY_CARE_PROVIDER_SITE_OTHER): Payer: BLUE CROSS/BLUE SHIELD | Admitting: Family Medicine

## 2015-01-28 ENCOUNTER — Encounter: Payer: Self-pay | Admitting: Family Medicine

## 2015-01-28 VITALS — BP 122/70 | HR 80 | Temp 98.0°F | Resp 16 | Wt 224.0 lb

## 2015-01-28 DIAGNOSIS — J309 Allergic rhinitis, unspecified: Secondary | ICD-10-CM | POA: Insufficient documentation

## 2015-01-28 DIAGNOSIS — K2100 Gastro-esophageal reflux disease with esophagitis, without bleeding: Secondary | ICD-10-CM | POA: Insufficient documentation

## 2015-01-28 DIAGNOSIS — N1832 Chronic kidney disease, stage 3b: Secondary | ICD-10-CM | POA: Insufficient documentation

## 2015-01-28 DIAGNOSIS — E876 Hypokalemia: Secondary | ICD-10-CM | POA: Insufficient documentation

## 2015-01-28 DIAGNOSIS — R9431 Abnormal electrocardiogram [ECG] [EKG]: Secondary | ICD-10-CM | POA: Insufficient documentation

## 2015-01-28 DIAGNOSIS — K449 Diaphragmatic hernia without obstruction or gangrene: Secondary | ICD-10-CM | POA: Insufficient documentation

## 2015-01-28 DIAGNOSIS — E041 Nontoxic single thyroid nodule: Secondary | ICD-10-CM | POA: Insufficient documentation

## 2015-01-28 DIAGNOSIS — H10529 Angular blepharoconjunctivitis, unspecified eye: Secondary | ICD-10-CM | POA: Insufficient documentation

## 2015-01-28 DIAGNOSIS — J45909 Unspecified asthma, uncomplicated: Secondary | ICD-10-CM | POA: Insufficient documentation

## 2015-01-28 DIAGNOSIS — E7800 Pure hypercholesterolemia, unspecified: Secondary | ICD-10-CM | POA: Insufficient documentation

## 2015-01-28 DIAGNOSIS — L309 Dermatitis, unspecified: Secondary | ICD-10-CM | POA: Insufficient documentation

## 2015-01-28 DIAGNOSIS — R109 Unspecified abdominal pain: Secondary | ICD-10-CM | POA: Diagnosis not present

## 2015-01-28 DIAGNOSIS — Z8541 Personal history of malignant neoplasm of cervix uteri: Secondary | ICD-10-CM | POA: Insufficient documentation

## 2015-01-28 DIAGNOSIS — E559 Vitamin D deficiency, unspecified: Secondary | ICD-10-CM | POA: Insufficient documentation

## 2015-01-28 DIAGNOSIS — E034 Atrophy of thyroid (acquired): Secondary | ICD-10-CM | POA: Insufficient documentation

## 2015-01-28 DIAGNOSIS — N183 Chronic kidney disease, stage 3 unspecified: Secondary | ICD-10-CM | POA: Insufficient documentation

## 2015-01-28 DIAGNOSIS — R7303 Prediabetes: Secondary | ICD-10-CM | POA: Insufficient documentation

## 2015-01-28 DIAGNOSIS — G4733 Obstructive sleep apnea (adult) (pediatric): Secondary | ICD-10-CM | POA: Insufficient documentation

## 2015-01-28 DIAGNOSIS — R609 Edema, unspecified: Secondary | ICD-10-CM | POA: Insufficient documentation

## 2015-01-28 DIAGNOSIS — O039 Complete or unspecified spontaneous abortion without complication: Secondary | ICD-10-CM | POA: Insufficient documentation

## 2015-01-28 DIAGNOSIS — Z205 Contact with and (suspected) exposure to viral hepatitis: Secondary | ICD-10-CM | POA: Insufficient documentation

## 2015-01-28 DIAGNOSIS — L259 Unspecified contact dermatitis, unspecified cause: Secondary | ICD-10-CM | POA: Insufficient documentation

## 2015-01-28 DIAGNOSIS — E039 Hypothyroidism, unspecified: Secondary | ICD-10-CM | POA: Insufficient documentation

## 2015-01-28 DIAGNOSIS — S92909A Unspecified fracture of unspecified foot, initial encounter for closed fracture: Secondary | ICD-10-CM | POA: Insufficient documentation

## 2015-01-28 NOTE — Progress Notes (Signed)
Patient ID: [name], female   DOB: 1955/07/28, 59 y.o.   MRN: [id number]   [name]  MRN: [id number] DOB: [birth year]  Subjective:  Abdominal Pain This is a new problem. The current episode started 1 to 4 weeks ago. The problem occurs intermittently. The most recent episode lasted 3 weeks. The problem has been gradually worsening. The abdominal pain does not radiate. Associated symptoms include nausea and vomiting. Pertinent negatives include no hematochezia or melena. The pain is relieved by vomiting and bowel movements. She has tried proton pump inhibitors for the symptoms. The treatment provided mild relief. Her past medical history is significant for abdominal surgery. gallbladder removal   Patient reports that about 4 days ago she has started back on PPI and she has felt a little better. Patient is concerned that she may have H. Pylori.  Patient also complains that that should she does not like the weight gain she has had a recent years.  Patient Active Problem List   Diagnosis Date Noted  . Abortion, spontaneous 01/28/2015  . Allergic rhinitis 01/28/2015  . Angular blepharoconjunctivitis 01/28/2015  . Airway hyperreactivity 01/28/2015  . Preventative health care 06/11/2012  . Hyperlipidemia 06/11/2012  . EDEMA 02/17/2010  . SHORTNESS OF BREATH 02/17/2010    Past Medical History  Diagnosis Date  . Unspecified vitamin D deficiency   . Reflux esophagitis   . Pure hypercholesterolemia   . Other abnormal glucose   . Hypopotassemia   . Sleep apnea   . OSA on CPAP   . Cervical cancer   . Hiatal hernia   . Chronic kidney disease, stage III (moderate)   . Thyroid fullness   . Thyroid nodule   . Foot fracture 2013    Social History   Social History  . Marital Status: Married    Spouse Name: N/A  . Number of Children: N/A  . Years of Education: N/A   Occupational History  . Not on file.   Social History Main Topics  . Smoking status: Never Smoker   .  Smokeless tobacco: Not on file  . Alcohol Use: Yes  . Drug Use: No  . Sexual Activity: Not on file   Other Topics Concern  . Not on file   Social History Narrative    Outpatient Prescriptions Prior to Visit  Medication Sig Dispense Refill  . albuterol (VENTOLIN HFA) 108 (90 BASE) MCG/ACT inhaler Inhale into the lungs.    . Azelastine-Fluticasone (DYMISTA) 137-50 MCG/ACT SUSP Place into the nose.    . Cholecalciferol (VITAMIN D PO) Take 1.25 mg by mouth once a week.    . DULERA 200-5 MCG/ACT AERO USE 2 PUFFS TWICE DAILY 13 g 5  . fluorometholone (FML) 0.1 % ophthalmic ointment Apply to eye.    . furosemide (LASIX) 40 MG tablet Take 40 mg by mouth daily.    . Hydrocodone-Acetaminophen 5-300 MG TABS Take as needed for foot fracture.    Marland Kitchen levothyroxine (SYNTHROID, LEVOTHROID) 25 MCG tablet Take 25 mcg by mouth daily.    Marland Kitchen loratadine (CLARITIN) 10 MG tablet Take by mouth.    . metolazone (ZAROXOLYN) 5 MG tablet Take 5 mg by mouth as needed.    . montelukast (SINGULAIR) 10 MG tablet Take by mouth.    . NON FORMULARY CPAP nightly.    Marland Kitchen olopatadine (PATANOL) 0.1 % ophthalmic solution Apply to eye.    . Omega-3 Fatty Acids (FISH OIL) 1200 MG CAPS Take 2 capsules twice a day.    Marland Kitchen  potassium chloride SA (K-DUR,KLOR-CON) 20 MEQ tablet Take 1 tablet (20 mEq total) by mouth daily. 30 tablet 1  . psyllium (REGULOID) 0.52 G capsule Take 0.52 g by mouth daily.    . ranitidine (ZANTAC) 150 MG capsule Take 150 mg by mouth daily as needed.     No facility-administered medications prior to visit.    Allergies  Allergen Reactions  . Penicillins     Swelling  Hives     Review of Systems  Constitutional: Negative.   HENT: Negative.   Eyes: Negative.   Respiratory: Negative.   Cardiovascular: Negative.   Gastrointestinal: Positive for nausea, vomiting and abdominal pain. Negative for melena and hematochezia.  Genitourinary: Negative.   Skin: Negative.   Neurological: Negative.    Psychiatric/Behavioral: Negative.    Objective:  BP 122/70 mmHg  Pulse 80  Temp(Src) 98 F (36.7 C)  Resp 16  Wt 224 lb (101.606 kg)  Physical Exam  Constitutional: She is oriented to person, place, and time and well-developed, well-nourished, and in no distress.  HENT:  Head: Normocephalic and atraumatic.  Right Ear: External ear normal.  Left Ear: External ear normal.  Nose: Nose normal.  Mouth/Throat: Oropharynx is clear and moist.  Eyes: Conjunctivae are normal.  Neck: Neck supple.  Cardiovascular: Normal rate, regular rhythm and normal heart sounds.   Pulmonary/Chest: Effort normal and breath sounds normal.  Abdominal: Soft. Bowel sounds are normal.  Neurological: She is alert and oriented to person, place, and time. Gait normal.  Skin: Skin is warm and dry.  Psychiatric: Memory, affect and judgment normal.    Assessment and Plan :  No diagnosis found.  Abdominal pain Symptoms are very vague and nonspecific. Patient is concerned about H. pylori and I think this is a low likelihood issue obtain lab work and use twice a day PPI for the time being. See her back in a couple of weeks. GI referral will be completely appropriate. Allergic rhinitis/asthma Presently controlled. It is certainly possible as allergies are playing a role in the abdominal pain. Consider lactose intolerance or a gluten intolerance Hypothyroidism Check TSH Obesity Diet and exercise discussed with patient. I think she would be much better served by going with a program such as Weight Watchers or [employer] lifestyle. I absolutely think she can lose weight she desires to lose [facility] 01/28/2015 2:54 PM

## 2015-01-29 ENCOUNTER — Other Ambulatory Visit: Payer: Self-pay

## 2015-01-29 MED ORDER — RANITIDINE HCL 150 MG PO CAPS: 150.0000 mg | ORAL_CAPSULE | Freq: Two times a day (BID) | ORAL | Status: DC

## 2015-01-30 ENCOUNTER — Telehealth: Payer: Self-pay | Admitting: Family Medicine

## 2015-01-30 NOTE — Telephone Encounter (Signed)
Patient was given a paper lab order due to computers being down at the time of OV. Called [facility] and requested labs to be faxed to our office.

## 2015-01-30 NOTE — Telephone Encounter (Signed)
Pt is requesting the labs that were done on 01/28/15. Thanks TNP

## 2015-02-02 NOTE — Telephone Encounter (Signed)
Pt called to see if we had gotten her results. I advised pt that Dr. Rosanna Randy was reviewing them and we would call back with the results as soon as we could. Thanks TNP

## 2015-02-02 NOTE — Telephone Encounter (Signed)
See below, her labs are on the green folder should be in your office. Thank you-aa

## 2015-02-06 ENCOUNTER — Encounter: Payer: Self-pay | Admitting: Family Medicine

## 2015-02-24 ENCOUNTER — Telehealth: Payer: Self-pay | Admitting: Physician Assistant

## 2015-02-24 NOTE — Telephone Encounter (Signed)
Pt is coming in for a CPE on 03/06/15 and wanted to know if she still needs to do a PAP. Pt stated she has had a complete hysterectomy. Please advise. Thanks TNP

## 2015-02-24 NOTE — Telephone Encounter (Signed)
Please advised.  Thanks,  -Joseline

## 2015-02-25 NOTE — Telephone Encounter (Signed)
Patient advised as directive below.  Thanks,  -Joseline 

## 2015-02-25 NOTE — Telephone Encounter (Signed)
As long as her total hysterectomy was not secondary to cervical cancer she does not require a pap smear.

## 2015-03-06 ENCOUNTER — Encounter: Payer: BLUE CROSS/BLUE SHIELD | Admitting: Physician Assistant

## 2015-04-22 ENCOUNTER — Other Ambulatory Visit: Payer: Self-pay

## 2015-04-22 MED ORDER — VITAMIN D (ERGOCALCIFEROL) 1.25 MG (50000 UNIT) PO CAPS: 50000.0000 [IU] | ORAL_CAPSULE | ORAL | Status: DC

## 2015-06-24 ENCOUNTER — Ambulatory Visit (INDEPENDENT_AMBULATORY_CARE_PROVIDER_SITE_OTHER): Payer: BLUE CROSS/BLUE SHIELD | Admitting: Physician Assistant

## 2015-06-24 ENCOUNTER — Other Ambulatory Visit: Payer: Self-pay | Admitting: Family Medicine

## 2015-06-24 ENCOUNTER — Encounter: Payer: Self-pay | Admitting: Physician Assistant

## 2015-06-24 VITALS — BP 130/70 | HR 72 | Temp 98.0°F | Resp 16 | Ht 67.5 in | Wt 224.6 lb

## 2015-06-24 DIAGNOSIS — Z1239 Encounter for other screening for malignant neoplasm of breast: Secondary | ICD-10-CM | POA: Diagnosis not present

## 2015-06-24 DIAGNOSIS — Z0000 Encounter for general adult medical examination without abnormal findings: Secondary | ICD-10-CM | POA: Diagnosis not present

## 2015-06-24 DIAGNOSIS — E039 Hypothyroidism, unspecified: Secondary | ICD-10-CM

## 2015-06-24 DIAGNOSIS — Z23 Encounter for immunization: Secondary | ICD-10-CM | POA: Diagnosis not present

## 2015-06-24 DIAGNOSIS — M5441 Lumbago with sciatica, right side: Secondary | ICD-10-CM | POA: Diagnosis not present

## 2015-06-24 DIAGNOSIS — I7300 Raynaud's syndrome without gangrene: Secondary | ICD-10-CM

## 2015-06-24 DIAGNOSIS — Z13220 Encounter for screening for lipoid disorders: Secondary | ICD-10-CM

## 2015-06-24 DIAGNOSIS — Z136 Encounter for screening for cardiovascular disorders: Secondary | ICD-10-CM | POA: Diagnosis not present

## 2015-06-24 MED ORDER — VITAMIN D (ERGOCALCIFEROL) 1.25 MG (50000 UNIT) PO CAPS: ORAL_CAPSULE | ORAL | Status: DC

## 2015-06-24 MED ORDER — BACLOFEN 10 MG PO TABS: 10.0000 mg | ORAL_TABLET | Freq: Every evening | ORAL | Status: DC | PRN

## 2015-06-24 NOTE — Progress Notes (Signed)
Patient: [name], Female    DOB: 02-Nov-1955, 60 y.o.   MRN: [id number] Visit Date: 06/24/2015  Today's Provider: Mar Daring, PA-C   Chief Complaint  Patient presents with  . Annual Exam   Subjective:    Annual physical exam [name] is a 60 y.o. female who presents today for health maintenance and complete physical. She feels well. She reports exercising-just started exercising with the wii and sit-up. She reports she is sleeping well with cpap machine. Patient saw the cardiologist at least five years ago and patient think she is due to go back in 10 years.   Last PCP:09/19/2013 PAP:09/20/13 NL Colonoscopy:11/13/08 Diverticulosis, Internal Hemorrhoids-Repeat in 10 years.  She also has some acute issues today of right-sided back pain that occurred following a trip to [place] to see her daughter over the holidays.  Pain is worsened with movement.  Occasional radiating symptoms down right buttocks and right posterior thigh.  She also has had abdominal pain and loose stools since being diagnosed and completing treatment for Lyme disease in March 2016. -----------------------------------------------------------------   Review of Systems  Constitutional: Negative.   HENT: Positive for postnasal drip. Negative for congestion, rhinorrhea, sinus pressure, sneezing, tinnitus and trouble swallowing.   Eyes: Negative for redness, itching and visual disturbance.  Respiratory: Positive for apnea (sleep apnea-uses CPAP). Negative for cough, chest tightness, shortness of breath and wheezing.   Cardiovascular: Negative.  Negative for chest pain.  Gastrointestinal: Positive for abdominal pain and diarrhea. Negative for nausea, vomiting and blood in stool.  Endocrine: Negative.   Genitourinary: Negative.   Musculoskeletal: Positive for back pain. Negative for gait problem.  Skin: Negative.   Allergic/Immunologic: Negative.   Neurological: Positive for numbness (in  the middle finger every two weeks). Negative for dizziness and headaches.  Hematological: Negative.   Psychiatric/Behavioral: Negative.     Social History      She  reports that she has never smoked. She does not have any smokeless tobacco history on file. She reports that she drinks alcohol. She reports that she does not use illicit drugs.       Social History   Social History  . Marital Status: Married    Spouse Name: N/A  . Number of Children: N/A  . Years of Education: N/A   Social History Main Topics  . Smoking status: Never Smoker   . Smokeless tobacco: None  . Alcohol Use: Yes  . Drug Use: No  . Sexual Activity: Not Asked   Other Topics Concern  . None   Social History Narrative    Past Medical History  Diagnosis Date  . Unspecified vitamin D deficiency   . Reflux esophagitis   . Pure hypercholesterolemia   . Other abnormal glucose   . Hypopotassemia   . Sleep apnea   . OSA on CPAP   . Cervical cancer ([place])   . Hiatal hernia   . Chronic kidney disease, stage III (moderate)   . Thyroid fullness   . Thyroid nodule   . Foot fracture 2013     Patient Active Problem List   Diagnosis Date Noted  . Abortion, spontaneous 01/28/2015  . Allergic rhinitis 01/28/2015  . Angular blepharoconjunctivitis 01/28/2015  . Asthma 01/28/2015  . Cervical cancer ([place]) 01/28/2015  . Chronic kidney disease (CKD), stage III (moderate) 01/28/2015  . CD (contact dermatitis) 01/28/2015  . Dermatitis, eczematoid 01/28/2015  . Edema 01/28/2015  . Esophagitis, reflux 01/28/2015  . Exposure  to viral hepatitis 01/28/2015  . Closed fracture of foot 01/28/2015  . Hiatal hernia 01/28/2015  . Decreased potassium in the blood 01/28/2015  . Adult hypothyroidism 01/28/2015  . Nonspecific ST-T changes 01/28/2015  . Obstructive sleep apnea 01/28/2015  . Borderline diabetes 01/28/2015  . Hypercholesterolemia without hypertriglyceridemia 01/28/2015  . Atrophy of thyroid 01/28/2015  .  Thyroid nodule 01/28/2015  . Vitamin D deficiency 01/28/2015  . Preventative health care 06/11/2012  . Hyperlipidemia 06/11/2012  . EDEMA 02/17/2010  . SHORTNESS OF BREATH 02/17/2010    Past Surgical History  Procedure Laterality Date  . Gallbladder surgery    . Total vaginal hysterectomy    . Foot surgery Left 03/2010  . Elbow fracture surgery Right   . Ganglion cyst excision    . Cholecystectomy      Family History        Family Status  Relation Status Death Age  . Father Deceased 61  . Brother Alive     diabetes mellitus Type II  . Mother Deceased     kidney trouble  . Maternal Grandmother Deceased     breast cancer  . Brother Alive     epilepsy  . Daughter Alive   . Daughter Alive   . Daughter Alive         Her family history includes CAD in her father; Diabetes in her brother; Heart attack (age of onset: 69) in her father; Heart disease in her father; Hypertension in her brother.    Allergies  Allergen Reactions  . Penicillins     Swelling  Hives     Previous Medications   ALBUTEROL (VENTOLIN HFA) 108 (90 BASE) MCG/ACT INHALER    Inhale into the lungs.   AZELASTINE-FLUTICASONE (DYMISTA) 137-50 MCG/ACT SUSP    Place into the nose.   CHOLECALCIFEROL (VITAMIN D PO)    Take 1.25 mg by mouth once a week.   DULERA 200-5 MCG/ACT AERO    USE 2 PUFFS TWICE DAILY   FLURANDRENOLIDE (CORDRAN) 0.05 % CREA    Apply topically.   FUROSEMIDE (LASIX) 40 MG TABLET    Take 40 mg by mouth daily.   LEVOTHYROXINE (SYNTHROID, LEVOTHROID) 25 MCG TABLET    Take 25 mcg by mouth daily.   LORATADINE (CLARITIN) 10 MG TABLET    Take by mouth.   MONTELUKAST (SINGULAIR) 10 MG TABLET    Take by mouth.   NON FORMULARY    CPAP nightly.   OLOPATADINE (PATANOL) 0.1 % OPHTHALMIC SOLUTION    Apply to eye.   OMEGA-3 FATTY ACIDS (FISH OIL) 1200 MG CAPS    Take 2 capsules twice a day.   POTASSIUM CHLORIDE SA (K-DUR,KLOR-CON) 20 MEQ TABLET    TAKE 1 TABLET BY MOUTH DAILY   RANITIDINE (ZANTAC) 150  MG CAPSULE    Take 1 capsule (150 mg total) by mouth 2 (two) times daily.   VITAMIN D, ERGOCALCIFEROL, (DRISDOL) 50000 UNITS CAPS CAPSULE    Take 1 capsule (50,000 Units total) by mouth every 7 (seven) days.    Patient Care Team: Jerrol Banana., MD as PCP - General (Unknown Physician Specialty)     Objective:   Vitals: BP 130/70 mmHg  Pulse 72  Temp(Src) 98 F (36.7 C) (Oral)  Resp 16  Ht 5' 7.5" (1.715 m)  Wt 224 lb 9.6 oz (101.878 kg)  BMI 34.64 kg/m2   Physical Exam  Constitutional: She is oriented to person, place, and time. She appears well-developed and well-nourished. No  distress.  HENT:  Head: Normocephalic and atraumatic.  Right Ear: External ear normal.  Left Ear: External ear normal.  Nose: Nose normal.  Mouth/Throat: Oropharynx is clear and moist. No oropharyngeal exudate.  Eyes: Conjunctivae and EOM are normal. Pupils are equal, round, and reactive to light. Right eye exhibits no discharge. Left eye exhibits no discharge. No scleral icterus.  Neck: Normal range of motion. Neck supple. No JVD present. No tracheal deviation present. No thyromegaly present.  Cardiovascular: Normal rate, regular rhythm, normal heart sounds and intact distal pulses.  Exam reveals no gallop and no friction rub.   No murmur heard. Pulmonary/Chest: Effort normal and breath sounds normal. No respiratory distress. She has no wheezes. She has no rales. She exhibits no tenderness. Right breast exhibits no inverted nipple, no mass, no nipple discharge, no skin change and no tenderness. Left breast exhibits no inverted nipple, no mass, no nipple discharge, no skin change and no tenderness. Breasts are symmetrical.  Abdominal: Soft. Bowel sounds are normal. She exhibits no distension and no mass. There is no tenderness. There is no rebound and no guarding.  Musculoskeletal: Normal range of motion. She exhibits no edema or tenderness.  Lymphadenopathy:    She has no cervical adenopathy.    Neurological: She is alert and oriented to person, place, and time.  Skin: Skin is warm and dry. No rash noted. She is not diaphoretic.  Psychiatric: She has a normal mood and affect. Her behavior is normal. Judgment and thought content normal.  Vitals reviewed.    Depression Screen No flowsheet data found.    Assessment & Plan:     Routine Health Maintenance and Physical Exam  1. Annual physical exam Physical exam today was normal.  Will check labs and f/u pending lab results. If labs are stable and WNL will see her back in one year for repeat physical exam. She is to call the office in the meantime if she develops any acute issue, question or concern. - CBC with Differential/Platelet - Comprehensive metabolic panel  2. Right-sided low back pain with right-sided sciatica Spasm noted in right paraspinal muscles in thoracic spine region.  Will give baclofen as below.  She is to call the office if symptoms worsen. - baclofen (LIORESAL) 10 MG tablet; Take 1 tablet (10 mg total) by mouth at bedtime as needed for muscle spasms.  Dispense: 30 each; Refill: 0  3. Breast cancer screening Breast exam was normal today.  Information for [facility] breast clinic was given to patient so she may schedule her mammogram at her convenience. - MM DIGITAL SCREENING BILATERAL; Future  4. Encounter for lipid screening for cardiovascular disease History of elevated cholesterol. Will check labs as below and f/u pending labs.  She also states she is going to call the cardiologist she has seen previously has she feels it is time for her check up.  She is to call the office if she has difficulties getting this appointment. - Lipid panel  5. Hypothyroidism, unspecified hypothyroidism type Stable on levothyroxine 70mcg.  Will check labs and f/u pending labs and adjust dose as necessary. - TSH  6. Raynaud's phenomenon She describes a burning, tingling sensation on the tip of her right middle finger when  her hands get cold.  Most likely Raynaud's. Advised to keep hands warm and covered especially if cold outside. She is to call the office if symptoms worsen or starts involving other fingers or toes.  7. Need for influenza vaccination Flu vaccine given today  without complication. - Flu Vaccine QUAD 36+ mos IM  8. Need for pneumococcal vaccination Prevnar given today without complication. - Pneumococcal conjugate vaccine 13-valent    Exercise Activities and Dietary recommendations Goals    None      Immunization History  Administered Date(s) Administered  . Pneumococcal Polysaccharide-23 02/13/2013  . Tdap 03/27/2008  . Zoster 08/03/2012    Health Maintenance  Topic Date Due  . Hepatitis C Screening  1955/10/13  . HIV Screening  06/28/1968  . PAP SMEAR  06/28/1974  . MAMMOGRAM  06/29/2003  . INFLUENZA VACCINE  01/05/2015  . TETANUS/TDAP  03/27/2018  . COLONOSCOPY  11/14/2018  . ZOSTAVAX  Completed      Discussed health benefits of physical activity, and encouraged her to engage in regular exercise appropriate for her age and condition.    --------------------------------------------------------------------

## 2015-06-24 NOTE — Addendum Note (Signed)
Addended by: Mar Daring on: 06/24/2015 12:58 PM   Modules accepted: Miquel Dunn

## 2015-06-24 NOTE — Addendum Note (Signed)
Addended by: Mar Daring on: 06/24/2015 03:26 PM   Modules accepted: Orders, SmartSet

## 2015-06-24 NOTE — Patient Instructions (Signed)

## 2015-06-25 ENCOUNTER — Telehealth: Payer: Self-pay

## 2015-06-25 LAB — CBC WITH DIFFERENTIAL/PLATELET
BASOS: 0 %
Basophils Absolute: 0 10*3/uL (ref 0.0–0.2)
EOS (ABSOLUTE): 0.2 10*3/uL (ref 0.0–0.4)
EOS: 3 %
HEMATOCRIT: 38.9 % (ref 34.0–46.6)
HEMOGLOBIN: 12.7 g/dL (ref 11.1–15.9)
IMMATURE GRANULOCYTES: 0 %
Immature Grans (Abs): 0 10*3/uL (ref 0.0–0.1)
LYMPHS ABS: 1.5 10*3/uL (ref 0.7–3.1)
Lymphs: 24 %
MCH: 27.8 pg (ref 26.6–33.0)
MCHC: 32.6 g/dL (ref 31.5–35.7)
MCV: 85 fL (ref 79–97)
MONOCYTES: 8 %
MONOS ABS: 0.5 10*3/uL (ref 0.1–0.9)
Neutrophils Absolute: 4.1 10*3/uL (ref 1.4–7.0)
Neutrophils: 65 %
PLATELETS: 226 10*3/uL (ref 150–379)
RBC: 4.57 x10E6/uL (ref 3.77–5.28)
RDW: 14.4 % (ref 12.3–15.4)
WBC: 6.2 10*3/uL (ref 3.4–10.8)

## 2015-06-25 LAB — LIPID PANEL
CHOL/HDL RATIO: 3 ratio (ref 0.0–4.4)
CHOLESTEROL TOTAL: 216 mg/dL — AB (ref 100–199)
HDL: 71 mg/dL (ref >39)
LDL CALC: 125 mg/dL — AB (ref 0–99)
Triglycerides: 100 mg/dL (ref 0–149)
VLDL CHOLESTEROL CAL: 20 mg/dL (ref 5–40)

## 2015-06-25 LAB — COMPREHENSIVE METABOLIC PANEL
ALBUMIN: 4.5 g/dL (ref 3.6–4.8)
ALK PHOS: 101 IU/L (ref 39–117)
ALT: 13 IU/L (ref 0–32)
AST: 17 IU/L (ref 0–40)
Albumin/Globulin Ratio: 1.8 (ref 1.1–2.5)
BUN / CREAT RATIO: 17 (ref 11–26)
BUN: 21 mg/dL (ref 8–27)
Bilirubin Total: 0.5 mg/dL (ref 0.0–1.2)
CALCIUM: 9.1 mg/dL (ref 8.7–10.3)
CO2: 28 mmol/L (ref 18–29)
CREATININE: 1.27 mg/dL — AB (ref 0.57–1.00)
Chloride: 99 mmol/L (ref 96–106)
GFR, EST AFRICAN AMERICAN: 53 mL/min/{1.73_m2} — AB (ref >59)
GFR, EST NON AFRICAN AMERICAN: 46 mL/min/{1.73_m2} — AB (ref >59)
GLOBULIN, TOTAL: 2.5 g/dL (ref 1.5–4.5)
GLUCOSE: 103 mg/dL — AB (ref 65–99)
Potassium: 4 mmol/L (ref 3.5–5.2)
SODIUM: 142 mmol/L (ref 134–144)
TOTAL PROTEIN: 7 g/dL (ref 6.0–8.5)

## 2015-06-25 LAB — TSH: TSH: 6.09 u[IU]/mL — ABNORMAL HIGH (ref 0.450–4.500)

## 2015-06-25 NOTE — Telephone Encounter (Signed)
-----   Message from Mar Daring, PA-C sent at 06/25/2015  1:16 PM EST ----- Cholesterol is borderline elevated. Try to limit foods with high saturated fat and cholesterol. Adding physical activity will help lower this as well.  Your HDL (good) cholesterol was elevated at 71 which offers cardioprotection.  You could add fish oil 1200 mg twice daily or red yeast rice to also help lower cholesterol. Adding physical activity for 30-40 minutes 3-4 days per week will also help.  Also TSH is slightly elevated at 6.090. I will increase your synthroid to 41mcg.  We will recheck thyroid in 3 months.  All other labs we will check in 1 year.

## 2015-06-25 NOTE — Telephone Encounter (Signed)
Patient advised as directed below. Copy of labs upfront for pick up. Patient scheduled for April 19. Thanks, -Joseline

## 2015-07-20 ENCOUNTER — Telehealth: Payer: Self-pay | Admitting: Physician Assistant

## 2015-07-20 DIAGNOSIS — M5441 Lumbago with sciatica, right side: Secondary | ICD-10-CM

## 2015-07-20 NOTE — Telephone Encounter (Signed)
I recommend xray of low back and possible re-evaluation since she has not responded to treatment over one month.  I can go ahead and place order for xray and f/u afterwards.

## 2015-07-20 NOTE — Telephone Encounter (Signed)
Patient advised as directed below and agree.  Thanks, -Chanice Brenton

## 2015-07-20 NOTE — Telephone Encounter (Signed)
Pt states the Rx baclofen (LIORESAL) 10 MG tablet is not helping at all.  Pt is requesting something different if possible.  [name].  CB#[phone]/MW

## 2015-07-21 ENCOUNTER — Ambulatory Visit
Admission: RE | Admit: 2015-07-21 | Discharge: 2015-07-21 | Disposition: A | Discharge status: Home / Self Care | Payer: BLUE CROSS/BLUE SHIELD | Source: Ambulatory Visit | Attending: Physician Assistant | Admitting: Physician Assistant

## 2015-07-21 DIAGNOSIS — M5441 Lumbago with sciatica, right side: Secondary | ICD-10-CM | POA: Diagnosis present

## 2015-07-22 ENCOUNTER — Telehealth: Payer: Self-pay

## 2015-07-22 DIAGNOSIS — M5441 Lumbago with sciatica, right side: Secondary | ICD-10-CM

## 2015-07-22 MED ORDER — BACLOFEN 10 MG PO TABS: 10.0000 mg | ORAL_TABLET | Freq: Every evening | ORAL | Status: DC | PRN

## 2015-07-22 MED ORDER — MELOXICAM 15 MG PO TABS: 15.0000 mg | ORAL_TABLET | Freq: Every day | ORAL | Status: DC

## 2015-07-22 NOTE — Telephone Encounter (Signed)
Pt is returning call.  CB#[phone]/MW

## 2015-07-22 NOTE — Telephone Encounter (Signed)
Patient advised as directed below.She only wants to do he Anti-inflammatory medication and wants to wait on PT referral. Also she said if she can take this medicine with the muscle relaxor medication.  Thanks,  -Janavia Rottman

## 2015-07-22 NOTE — Telephone Encounter (Signed)
LMTCB  Thanks,  -Joseline 

## 2015-07-22 NOTE — Telephone Encounter (Signed)
Yes she can take both together. I will send in meloxicam with refill of baclofen. She can call if she wants something other than baclofen.

## 2015-07-22 NOTE — Telephone Encounter (Signed)
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 There is also some disc flattening noted as well that is also degenerative in nature. May benefit from continuing anti-inflammatory with adding physical therapy. I can send in new Rx for anti-inflammatory if needed and referral to PT if wanted. If symptoms continue with this as well will try to obtain MRI to see if there is nerve root impingement.

## 2015-07-22 NOTE — Telephone Encounter (Signed)
Patient advised as directed below.  Thanks.  -Maniyah Moller 

## 2015-09-23 ENCOUNTER — Ambulatory Visit: Payer: Self-pay | Admitting: Physician Assistant

## 2016-02-11 ENCOUNTER — Other Ambulatory Visit: Payer: Self-pay | Admitting: Family Medicine

## 2016-02-17 ENCOUNTER — Ambulatory Visit
Admission: RE | Admit: 2016-02-17 | Discharge: 2016-02-17 | Disposition: A | Discharge status: Home / Self Care | Payer: BLUE CROSS/BLUE SHIELD | Source: Ambulatory Visit | Attending: Physician Assistant | Admitting: Physician Assistant

## 2016-02-17 ENCOUNTER — Ambulatory Visit (INDEPENDENT_AMBULATORY_CARE_PROVIDER_SITE_OTHER): Payer: BLUE CROSS/BLUE SHIELD | Admitting: Physician Assistant

## 2016-02-17 ENCOUNTER — Encounter: Payer: Self-pay | Admitting: Physician Assistant

## 2016-02-17 VITALS — BP 120/68 | HR 58 | Temp 97.9°F | Resp 16 | Wt 234.0 lb

## 2016-02-17 DIAGNOSIS — W19XXXA Unspecified fall, initial encounter: Secondary | ICD-10-CM

## 2016-02-17 DIAGNOSIS — M25511 Pain in right shoulder: Secondary | ICD-10-CM | POA: Insufficient documentation

## 2016-02-17 MED ORDER — MELOXICAM 15 MG PO TABS: 15.0000 mg | ORAL_TABLET | Freq: Every day | ORAL | 0 refills | Status: DC

## 2016-02-17 NOTE — Patient Instructions (Signed)

## 2016-02-17 NOTE — Progress Notes (Signed)
Patient: [name] Female    DOB: Apr 01, 1956   60 y.o.   MRN: [id number] Visit Date: 02/17/2016  Today's Provider: Mar Daring, PA-C   Chief Complaint  Patient presents with  . Shoulder Pain   Subjective:    Shoulder Pain   The pain is present in the right shoulder. This is a new problem. The current episode started more than 1 month ago (She fell and hit that shoulder and it was only sore). The problem occurs constantly (In the last 2-3 week the pain has become more constant). The problem has been gradually worsening. The quality of the pain is described as sharp and aching (It depends what she is doing and radiates down her arm with a burning feeling). The pain is at a severity of 6/10. Associated symptoms include an inability to bear weight, a limited range of motion and stiffness. Pertinent negatives include no numbness or tingling. The symptoms are aggravated by activity. She has tried acetaminophen, NSAIDS and heat (More Aleve and Motrin and Biofreeze) for the symptoms.   The fall she reports was a direct fall to the right shoulder. She states that she was pushing her door open and it was raining outside and both of her feet slipped out from underneath of her and she fell directly on right shoulder.    Allergies  Allergen Reactions  . Penicillins     Swelling  Hives      Current Outpatient Prescriptions:  .  albuterol (VENTOLIN HFA) 108 (90 BASE) MCG/ACT inhaler, Inhale into the lungs., Disp: , Rfl:  .  Azelastine-Fluticasone (DYMISTA) 137-50 MCG/ACT SUSP, Place into the nose., Disp: , Rfl:  .  DULERA 200-5 MCG/ACT AERO, USE 2 PUFFS TWICE DAILY, Disp: 13 g, Rfl: 5 .  furosemide (LASIX) 40 MG tablet, Take 40 mg by mouth daily., Disp: , Rfl:  .  levothyroxine (SYNTHROID, LEVOTHROID) 25 MCG tablet, Take 25 mcg by mouth daily., Disp: , Rfl:  .  loratadine (CLARITIN) 10 MG tablet, Take by mouth., Disp: , Rfl:  .  montelukast (SINGULAIR) 10 MG tablet, Take by  mouth., Disp: , Rfl:  .  NON FORMULARY, CPAP nightly., Disp: , Rfl:  .  Omega-3 Fatty Acids (FISH OIL) 1200 MG CAPS, Take 2 capsules twice a day., Disp: , Rfl:  .  potassium chloride SA (K-DUR,KLOR-CON) 20 MEQ tablet, TAKE 1 TABLET BY MOUTH DAILY, Disp: 30 tablet, Rfl: 12 .  ranitidine (ZANTAC) 150 MG tablet, TAKE ONE TABLET TWICE DAILY, Disp: 60 tablet, Rfl: 5 .  Vitamin D, Ergocalciferol, (DRISDOL) 50000 units CAPS capsule, TAKE 1 CAPSULE BY MOUTH EVERY 7 DAYS, Disp: 4 capsule, Rfl: 6 .  baclofen (LIORESAL) 10 MG tablet, Take 1 tablet (10 mg total) by mouth at bedtime as needed for muscle spasms. (Patient not taking: Reported on 02/17/2016), Disp: 30 each, Rfl: 0 .  meloxicam (MOBIC) 15 MG tablet, Take 1 tablet (15 mg total) by mouth daily. (Patient not taking: Reported on 02/17/2016), Disp: 30 tablet, Rfl: 0  Review of Systems  Respiratory: Negative.   Cardiovascular: Negative.   Musculoskeletal: Positive for arthralgias, myalgias and stiffness. Negative for joint swelling, neck pain and neck stiffness.  Neurological: Positive for weakness (right hand). Negative for dizziness, tingling, numbness and headaches.    Social History  Substance Use Topics  . Smoking status: Never Smoker  . Smokeless tobacco: Never Used  . Alcohol use Yes   Objective:   BP 120/68 (BP Location:  Left Arm, Patient Position: Sitting, Cuff Size: Large)   Pulse (!) 58   Temp 97.9 F (36.6 C) (Oral)   Resp 16   Wt 234 lb (106.1 kg)   BMI 36.11 kg/m   Physical Exam  Constitutional: She appears well-developed and well-nourished. No distress.  Neck: Normal range of motion. Neck supple.  Cardiovascular: Normal rate, regular rhythm and normal heart sounds.  Exam reveals no gallop and no friction rub.   No murmur heard. Pulmonary/Chest: Effort normal and breath sounds normal. No respiratory distress. She has no wheezes. She has no rales.  Musculoskeletal:       Right shoulder: She exhibits decreased range of  motion, tenderness (over deltoid and posterior shoulder joint) and decreased strength. She exhibits no bony tenderness, no swelling, no effusion, no crepitus, no deformity, no laceration, no pain, no spasm and normal pulse.  Abduction caused most pain, IR at 90 degree caused next most pain  Discomfort with abd even during PROM  Skin: She is not diaphoretic.  Vitals reviewed.     Assessment & Plan:     1. Shoulder pain, acute, right I will get an x-ray to rule out any bony abnormality or possible old injury from the fall that occurred a few months back. I will also switch her to meloxicam as below from Aleve to see if this may offer her a little bit better pain relief for the time being. We did discuss possible cortisone injection but she would prefer to wait and get this from orthopedics. She was worried that if she got one in our office that did work well and if she went to the orthopedic they would not be able to do one for another 3 months. If x-ray is within normal limits I will refer her to orthopedics for further evaluation of the right shoulder pain. I do question possible rotator cuff versus labral involvement. It is also possible that she may have early signs of adhesive capsulitis secondary to not moving the shoulder much after the fall. She is to call the office if she has any acute issue or worsening symptoms in the meantime. - DG Shoulder Right; Future - meloxicam (MOBIC) 15 MG tablet; Take 1 tablet (15 mg total) by mouth daily.  Dispense: 30 tablet; Refill: 0 - Ambulatory referral to Orthopedic Surgery  2. Fall, initial encounter See above medical treatment plan. - Ambulatory referral to [place], PA-C  [facility]

## 2016-03-07 DIAGNOSIS — M25519 Pain in unspecified shoulder: Secondary | ICD-10-CM | POA: Insufficient documentation

## 2016-06-28 ENCOUNTER — Other Ambulatory Visit: Payer: Self-pay

## 2016-06-28 DIAGNOSIS — E559 Vitamin D deficiency, unspecified: Secondary | ICD-10-CM

## 2016-06-28 MED ORDER — VITAMIN D (ERGOCALCIFEROL) 1.25 MG (50000 UNIT) PO CAPS: ORAL_CAPSULE | ORAL | 5 refills | Status: DC

## 2016-06-28 NOTE — Telephone Encounter (Signed)
Ok to call in Vit D 50,000 IU take one cap every 7 days #4 5RF to [employer]

## 2016-06-29 NOTE — Telephone Encounter (Signed)
done

## 2016-08-03 ENCOUNTER — Other Ambulatory Visit: Payer: Self-pay | Admitting: Physician Assistant

## 2016-08-03 DIAGNOSIS — M545 Low back pain: Principal | ICD-10-CM

## 2016-08-03 DIAGNOSIS — G8929 Other chronic pain: Secondary | ICD-10-CM

## 2016-08-04 ENCOUNTER — Ambulatory Visit (INDEPENDENT_AMBULATORY_CARE_PROVIDER_SITE_OTHER): Payer: BLUE CROSS/BLUE SHIELD | Admitting: Physician Assistant

## 2016-08-04 ENCOUNTER — Encounter: Payer: Self-pay | Admitting: Physician Assistant

## 2016-08-04 VITALS — BP 130/80 | HR 64 | Temp 97.9°F | Resp 16 | Ht 68.0 in | Wt 238.2 lb

## 2016-08-04 DIAGNOSIS — R600 Localized edema: Secondary | ICD-10-CM | POA: Diagnosis not present

## 2016-08-04 DIAGNOSIS — Z0000 Encounter for general adult medical examination without abnormal findings: Secondary | ICD-10-CM

## 2016-08-04 DIAGNOSIS — R7303 Prediabetes: Secondary | ICD-10-CM | POA: Diagnosis not present

## 2016-08-04 DIAGNOSIS — M79601 Pain in right arm: Secondary | ICD-10-CM

## 2016-08-04 DIAGNOSIS — E7800 Pure hypercholesterolemia, unspecified: Secondary | ICD-10-CM | POA: Diagnosis not present

## 2016-08-04 DIAGNOSIS — N183 Chronic kidney disease, stage 3 unspecified: Secondary | ICD-10-CM

## 2016-08-04 DIAGNOSIS — Z1231 Encounter for screening mammogram for malignant neoplasm of breast: Secondary | ICD-10-CM

## 2016-08-04 DIAGNOSIS — Z1239 Encounter for other screening for malignant neoplasm of breast: Secondary | ICD-10-CM

## 2016-08-04 DIAGNOSIS — E039 Hypothyroidism, unspecified: Secondary | ICD-10-CM

## 2016-08-04 DIAGNOSIS — Z1211 Encounter for screening for malignant neoplasm of colon: Secondary | ICD-10-CM | POA: Diagnosis not present

## 2016-08-04 DIAGNOSIS — E559 Vitamin D deficiency, unspecified: Secondary | ICD-10-CM

## 2016-08-04 LAB — IFOBT (OCCULT BLOOD): IMMUNOLOGICAL FECAL OCCULT BLOOD TEST: NEGATIVE

## 2016-08-04 MED ORDER — POTASSIUM CHLORIDE ER 10 MEQ PO CPCR: 20.0000 meq | ORAL_CAPSULE | Freq: Every day | ORAL | 5 refills | Status: DC

## 2016-08-04 NOTE — Progress Notes (Signed)
Patient: [name], Female    DOB: [birth year], 61 y.o.   MRN: [id number] Visit Date: 08/04/2016  Today's Provider: Mar Daring, PA-C   Chief Complaint  Patient presents with  . Annual Exam   Subjective:    Annual physical exam [name] is a 61 y.o. female who presents today for health maintenance and complete physical. She feels well. She reports exercising 3 days a week. She reports she is sleeping well.  06/24/15 AWE 09/18/13 Pap-neg 11/13/08 colon-diverticulosis,recheck in 10 yrs 07/11/12 Mammogram-BI-RADS 1 07/11/12 BMD-normal ----------------------------------------------------------------- Arm pain: Patient reports that she is still having right arm pain on and off since 02/2016. Patient reports symptoms have been present since her fall in 02/2016 she was seen by ortho in 02/2016. Patient reports that she did get a cortisone injection at that time and symptoms improved. Patient reports she is taking meloxicam 7.5 mg BID for about 2 weeks now, and reports moderate symptom improvement.   Edema: Patient reports she has increased her Lasix 40 mg BID times 2 weeks. Patient reports that she has been having left leg swelling. Patient reports that she stopped drinking sweet tea and is now drinking water but feels that she is not urinating as much as she is drinking water. Patient reports that her Lasix was decreased to 20 mg once a day by Dr. Candiss Norse. She follows with Dr. Candiss Norse once per year.  Review of Systems  Constitutional: Negative.   HENT: Negative.   Eyes: Negative.   Respiratory: Positive for apnea.   Cardiovascular: Positive for leg swelling.  Gastrointestinal: Negative.   Endocrine: Negative.   Genitourinary: Negative.   Musculoskeletal: Positive for myalgias.  Skin: Negative.   Allergic/Immunologic: Negative.   Neurological: Negative.   Hematological: Negative.   Psychiatric/Behavioral: Negative.     Social History      She  reports that she  has never smoked. She has never used smokeless tobacco. She reports that she drinks alcohol. She reports that she does not use drugs.       Social History   Social History  . Marital status: Married    Spouse name: N/A  . Number of children: 3  . Years of education: N/A   Occupational History  .      self employeed   Social History Main Topics  . Smoking status: Never Smoker  . Smokeless tobacco: Never Used  . Alcohol use Yes  . Drug use: No  . Sexual activity: Not Asked   Other Topics Concern  . None   Social History Narrative  . None    Past Medical History:  Diagnosis Date  . Cervical cancer ([place])   . Chronic kidney disease, stage III (moderate)   . Foot fracture 2013  . Hiatal hernia   . Hypopotassemia   . OSA on CPAP   . Other abnormal glucose   . Pure hypercholesterolemia   . Reflux esophagitis   . Sleep apnea   . Thyroid fullness   . Thyroid nodule   . Unspecified vitamin D deficiency      Patient Active Problem List   Diagnosis Date Noted  . Abortion, spontaneous 01/28/2015  . Allergic rhinitis 01/28/2015  . Angular blepharoconjunctivitis 01/28/2015  . Asthma 01/28/2015  . Cervical cancer ([place]) 01/28/2015  . Chronic kidney disease (CKD), stage III (moderate) 01/28/2015  . CD (contact dermatitis) 01/28/2015  . Dermatitis, eczematoid 01/28/2015  . Edema 01/28/2015  . Esophagitis, reflux 01/28/2015  .  Exposure to viral hepatitis 01/28/2015  . Closed fracture of foot 01/28/2015  . Hiatal hernia 01/28/2015  . Decreased potassium in the blood 01/28/2015  . Adult hypothyroidism 01/28/2015  . Nonspecific ST-T changes 01/28/2015  . Obstructive sleep apnea 01/28/2015  . Borderline diabetes 01/28/2015  . Hypercholesterolemia without hypertriglyceridemia 01/28/2015  . Atrophy of thyroid 01/28/2015  . Thyroid nodule 01/28/2015  . Vitamin D deficiency 01/28/2015  . Preventative health care 06/11/2012  . Hyperlipidemia 06/11/2012  . EDEMA 02/17/2010    . SHORTNESS OF BREATH 02/17/2010    Past Surgical History:  Procedure Laterality Date  . CHOLECYSTECTOMY    . ELBOW FRACTURE SURGERY Right   . FOOT SURGERY Left 03/2010  . GALLBLADDER SURGERY    . GANGLION CYST EXCISION    . TOTAL VAGINAL HYSTERECTOMY      Family History        Family Status  Relation Status  . Father Deceased at age 17  . Brother Alive   diabetes mellitus Type II  . Mother Deceased   kidney trouble  . Maternal Grandmother Deceased   breast cancer  . Brother Alive   epilepsy  . Daughter Alive  . Daughter Alive  . Daughter Alive        Her family history includes CAD in her father; Diabetes in her brother; Heart attack (age of onset: 42) in her father; Heart disease in her father; Hypertension in her brother.     Allergies  Allergen Reactions  . Penicillins     Swelling  Hives      Current Outpatient Prescriptions:  .  albuterol (VENTOLIN HFA) 108 (90 BASE) MCG/ACT inhaler, Inhale 2 puffs into the lungs as needed. , Disp: , Rfl:  .  Azelastine-Fluticasone (DYMISTA) 137-50 MCG/ACT SUSP, Place 2 sprays into the nose daily. , Disp: , Rfl:  .  furosemide (LASIX) 40 MG tablet, Take 40 mg by mouth 2 (two) times daily. , Disp: , Rfl:  .  loratadine (CLARITIN) 10 MG tablet, Take 10 mg by mouth daily. , Disp: , Rfl:  .  meloxicam (MOBIC) 7.5 MG tablet, TAKE ONE TABLET TWICE DAILY WITH MEALS, Disp: 60 tablet, Rfl: 3 .  montelukast (SINGULAIR) 10 MG tablet, Take 10 mg by mouth at bedtime. , Disp: , Rfl:  .  NON FORMULARY, CPAP nightly., Disp: , Rfl:  .  Omega-3 Fatty Acids (FISH OIL) 1200 MG CAPS, Take 2 capsules twice a day., Disp: , Rfl:  .  potassium chloride SA (K-DUR,KLOR-CON) 20 MEQ tablet, TAKE 1 TABLET BY MOUTH DAILY, Disp: 30 tablet, Rfl: 12 .  ranitidine (ZANTAC) 150 MG tablet, TAKE ONE TABLET TWICE DAILY, Disp: 60 tablet, Rfl: 5 .  Vitamin D, Ergocalciferol, (DRISDOL) 50000 units CAPS capsule, TAKE 1 CAPSULE BY MOUTH EVERY 7 DAYS, Disp: 4 capsule,  Rfl: 5 .  DULERA 200-5 MCG/ACT AERO, USE 2 PUFFS TWICE DAILY (Patient not taking: Reported on 08/04/2016), Disp: 13 g, Rfl: 5 .  levothyroxine (SYNTHROID, LEVOTHROID) 50 MCG tablet, Take 1 tablet by mouth daily., Disp: , Rfl: 5   Patient Care Team: Jerrol Banana., MD as PCP - General (Unknown Physician Specialty)      Objective:   Vitals: BP 130/80 (BP Location: Left Arm, Patient Position: Sitting, Cuff Size: Large)   Pulse 64   Temp 97.9 F (36.6 C) (Oral)   Resp 16   Ht 5\' 8"  (1.727 m)   Wt 238 lb 3.2 oz (108 kg)   SpO2 99%  BMI 36.22 kg/m    Vitals:   08/04/16 1043  BP: 130/80  Pulse: 64  Resp: 16  Temp: 97.9 F (36.6 C)  TempSrc: Oral  SpO2: 99%  Weight: 238 lb 3.2 oz (108 kg)  Height: 5\' 8"  (1.727 m)     Physical Exam  Constitutional: She is oriented to person, place, and time. She appears well-developed and well-nourished. No distress.  HENT:  Head: Normocephalic and atraumatic.  Right Ear: Hearing, tympanic membrane, external ear and ear canal normal.  Left Ear: Hearing, tympanic membrane, external ear and ear canal normal.  Nose: Nose normal.  Mouth/Throat: Uvula is midline, oropharynx is clear and moist and mucous membranes are normal. No oropharyngeal exudate, posterior oropharyngeal edema or posterior oropharyngeal erythema.  Eyes: Conjunctivae and EOM are normal. Pupils are equal, round, and reactive to light. Right eye exhibits no discharge. Left eye exhibits no discharge. No scleral icterus.  Neck: Normal range of motion. Neck supple. No JVD present. Carotid bruit is not present. No tracheal deviation present. No thyromegaly present.  Cardiovascular: Normal rate, regular rhythm, normal heart sounds and intact distal pulses.  Exam reveals no gallop and no friction rub.   No murmur heard. Pulmonary/Chest: Effort normal and breath sounds normal. No respiratory distress. She has no wheezes. She has no rales. She exhibits no tenderness. Right breast  exhibits no inverted nipple, no mass, no nipple discharge, no skin change and no tenderness. Left breast exhibits no inverted nipple, no mass, no nipple discharge, no skin change and no tenderness. Breasts are symmetrical.  Abdominal: Soft. Bowel sounds are normal. She exhibits no distension and no mass. There is no tenderness. There is no rebound and no guarding. Hernia confirmed negative in the right inguinal area and confirmed negative in the left inguinal area.  Genitourinary: Rectum normal and vagina normal. Rectal exam shows no external hemorrhoid, no fissure, no mass and guaiac negative stool. No breast swelling, tenderness, discharge or bleeding. Pelvic exam was performed with patient supine. There is no rash, tenderness, lesion or injury on the right labia. There is no rash, tenderness, lesion or injury on the left labia. No erythema, tenderness or bleeding in the vagina. No signs of injury around the vagina. No vaginal discharge found.  Genitourinary Comments: Uterus, cervix, and ovaries surgically absent  Musculoskeletal: Normal range of motion. She exhibits no edema or tenderness.  Lymphadenopathy:    She has no cervical adenopathy.       Right: No inguinal adenopathy present.       Left: No inguinal adenopathy present.  Neurological: She is alert and oriented to person, place, and time. She has normal reflexes. No cranial nerve deficit. Coordination normal.  Skin: Skin is warm and dry. No rash noted. She is not diaphoretic.  Psychiatric: She has a normal mood and affect. Her behavior is normal. Judgment and thought content normal.  Vitals reviewed.   Depression Screen PHQ 2/9 Scores 08/04/2016  PHQ - 2 Score 0  PHQ- 9 Score 0     Assessment & Plan:     Routine Health Maintenance and Physical Exam  Exercise Activities and Dietary recommendations Goals    None      Immunization History  Administered Date(s) Administered  . Influenza,inj,Quad PF,36+ Mos 06/24/2015  .  Pneumococcal Conjugate-13 06/24/2015  . Pneumococcal Polysaccharide-23 02/13/2013  . Tdap 03/27/2008  . Zoster 08/03/2012    Health Maintenance  Topic Date Due  . Hepatitis C Screening  [birth year]  . HIV Screening  06/28/1968  . PAP SMEAR  06/28/1974  . MAMMOGRAM  06/29/2003  . INFLUENZA VACCINE  02/04/2017 (Originally 01/05/2016)  . TETANUS/TDAP  03/27/2018  . COLONOSCOPY  11/14/2018     Discussed health benefits of physical activity, and encouraged her to engage in regular exercise appropriate for her age and condition.    1. Annual physical exam Normal physical exam today. Will check labs as below and f/u pending lab results. If labs are stable and WNL she will not need to have these rechecked for one year at her next annual physical exam. She is to call the office in the meantime if she has any acute issue, questions or concerns. - CBC with Differential - Comprehensive metabolic panel  2. Breast cancer screening Breast exam today was normal. There is no family history of breast cancer. She does perform regular self breast exams. Mammogram was ordered as below. Information for [facility] was given to patient so she may schedule her mammogram at her convenience. - Mammogram Digital Screening; Future  3. Colon cancer screening OC lite collected today and was negative. - IFOBT POC (occult bld, rslt in office)  4. Adult hypothyroidism Will check labs as below and f/u pending results. - TSH  5. Chronic kidney disease (CKD), stage III (moderate) Will check labs as below and f/u pending results. - CBC with Differential - Comprehensive metabolic panel - potassium chloride (MICRO-K) 10 MEQ CR capsule; Take 2 capsules (20 mEq total) by mouth daily.  Dispense: 60 capsule; Refill: 5  6. Vitamin D deficiency Patient using high dose Vit D supplementation. Will check labs as below and f/u pending results. - Vitamin D (25 hydroxy)  7. Hypercholesterolemia without  hypertriglyceridemia Will check labs as below and f/u pending results. Patient has refused statins in the past.  - Lipid panel  8. Borderline diabetes Monitoring diet. Will check labs as below and f/u pending results. - Comprehensive metabolic panel - Hemoglobin A1c  9. Lower extremity edema Patient has been having worsening bilateral lower extremity edema with L > R. She has increased her lasix on her own back to 40mg  and reports she is having improvement. She is requesting to switch her potassium to a smaller dose to help swallow better. This has been sent as below. Will heck labs as above and f/u pending results.  - potassium chloride (MICRO-K) 10 MEQ CR capsule; Take 2 capsules (20 mEq total) by mouth daily.  Dispense: 60 capsule; Refill: 5  10. Right arm pain Improved with meloxicam 7.5 mg daily.  --------------------------------------------------------------------    Mar Daring, PA-C  [facility]

## 2016-08-04 NOTE — Patient Instructions (Signed)
Health Maintenance, Female Adopting a healthy lifestyle and getting preventive care can go a long way to promote health and wellness. Talk with your health care provider about what schedule of regular examinations is right for you. This is a good chance for you to check in with your provider about disease prevention and staying healthy. In between checkups, there are plenty of things you can do on your own. Experts have done a lot of research about which lifestyle changes and preventive measures are most likely to keep you healthy. Ask your health care provider for more information. Weight and diet Eat a healthy diet  Be sure to include plenty of vegetables, fruits, low-fat dairy products, and lean protein.  Do not eat a lot of foods high in solid fats, added sugars, or salt.  Get regular exercise. This is one of the most important things you can do for your health.  Most adults should exercise for at least 150 minutes each week. The exercise should increase your heart rate and make you sweat (moderate-intensity exercise).  Most adults should also do strengthening exercises at least twice a week. This is in addition to the moderate-intensity exercise. Maintain a healthy weight  Body mass index (BMI) is a measurement that can be used to identify possible weight problems. It estimates body fat based on height and weight. Your health care provider can help determine your BMI and help you achieve or maintain a healthy weight.  For females 62 years of age and older:  A BMI below 18.5 is considered underweight.  A BMI of 18.5 to 24.9 is normal.  A BMI of 25 to 29.9 is considered overweight.  A BMI of 30 and above is considered obese. Watch levels of cholesterol and blood lipids  You should start having your blood tested for lipids and cholesterol at 63 years of age, then have this test every 5 years.  You may need to have your cholesterol levels checked more often if:  Your lipid or  cholesterol levels are high.  You are older than 63 years of age.  You are at high risk for heart disease. Cancer screening Lung Cancer  Lung cancer screening is recommended for adults 59-21 years old who are at high risk for lung cancer because of a history of smoking.  A yearly low-dose CT scan of the lungs is recommended for people who:  Currently smoke.  Have quit within the past 15 years.  Have at least a 30-pack-year history of smoking. A pack year is smoking an average of one pack of cigarettes a day for 1 year.  Yearly screening should continue until it has been 15 years since you quit.  Yearly screening should stop if you develop a health problem that would prevent you from having lung cancer treatment. Breast Cancer  Practice breast self-awareness. This means understanding how your breasts normally appear and feel.  It also means doing regular breast self-exams. Let your health care provider know about any changes, no matter how small.  If you are in your 20s or 30s, you should have a clinical breast exam (CBE) by a health care provider every 1-3 years as part of a regular health exam.  If you are 25 or older, have a CBE every year. Also consider having a breast X-ray (mammogram) every year.  If you have a family history of breast cancer, talk to your health care provider about genetic screening.  If you are at high risk for breast cancer, talk  to your health care provider about having an MRI and a mammogram every year.  Breast cancer gene (BRCA) assessment is recommended for women who have family members with BRCA-related cancers. BRCA-related cancers include:  Breast.  Ovarian.  Tubal.  Peritoneal cancers.  Results of the assessment will determine the need for genetic counseling and BRCA1 and BRCA2 testing. Cervical Cancer  Your health care provider may recommend that you be screened regularly for cancer of the pelvic organs (ovaries, uterus, and vagina).  This screening involves a pelvic examination, including checking for microscopic changes to the surface of your cervix (Pap test). You may be encouraged to have this screening done every 3 years, beginning at age 24.  For women ages 66-65, health care providers may recommend pelvic exams and Pap testing every 3 years, or they may recommend the Pap and pelvic exam, combined with testing for human papilloma virus (HPV), every 5 years. Some types of HPV increase your risk of cervical cancer. Testing for HPV may also be done on women of any age with unclear Pap test results.  Other health care providers may not recommend any screening for nonpregnant women who are considered low risk for pelvic cancer and who do not have symptoms. Ask your health care provider if a screening pelvic exam is right for you.  If you have had past treatment for cervical cancer or a condition that could lead to cancer, you need Pap tests and screening for cancer for at least 20 years after your treatment. If Pap tests have been discontinued, your risk factors (such as having a new sexual partner) need to be reassessed to determine if screening should resume. Some women have medical problems that increase the chance of getting cervical cancer. In these cases, your health care provider may recommend more frequent screening and Pap tests. Colorectal Cancer  This type of cancer can be detected and often prevented.  Routine colorectal cancer screening usually begins at 63 years of age and continues through 63 years of age.  Your health care provider may recommend screening at an earlier age if you have risk factors for colon cancer.  Your health care provider may also recommend using home test kits to check for hidden blood in the stool.  A small camera at the end of a tube can be used to examine your colon directly (sigmoidoscopy or colonoscopy). This is done to check for the earliest forms of colorectal cancer.  Routine  screening usually begins at age 41.  Direct examination of the colon should be repeated every 5-10 years through 63 years of age. However, you may need to be screened more often if early forms of precancerous polyps or small growths are found. Skin Cancer  Check your skin from head to toe regularly.  Tell your health care provider about any new moles or changes in moles, especially if there is a change in a mole's shape or color.  Also tell your health care provider if you have a mole that is larger than the size of a pencil eraser.  Always use sunscreen. Apply sunscreen liberally and repeatedly throughout the day.  Protect yourself by wearing long sleeves, pants, a wide-brimmed hat, and sunglasses whenever you are outside. Heart disease, diabetes, and high blood pressure  High blood pressure causes heart disease and increases the risk of stroke. High blood pressure is more likely to develop in:  People who have blood pressure in the high end of the normal range (130-139/85-89 mm Hg).  People who are overweight or obese.  People who are African American.  If you are 59-24 years of age, have your blood pressure checked every 3-5 years. If you are 34 years of age or older, have your blood pressure checked every year. You should have your blood pressure measured twice-once when you are at a hospital or clinic, and once when you are not at a hospital or clinic. Record the average of the two measurements. To check your blood pressure when you are not at a hospital or clinic, you can use:  An automated blood pressure machine at a pharmacy.  A home blood pressure monitor.  If you are between 29 years and 60 years old, ask your health care provider if you should take aspirin to prevent strokes.  Have regular diabetes screenings. This involves taking a blood sample to check your fasting blood sugar level.  If you are at a normal weight and have a low risk for diabetes, have this test once  every three years after 63 years of age.  If you are overweight and have a high risk for diabetes, consider being tested at a younger age or more often. Preventing infection Hepatitis B  If you have a higher risk for hepatitis B, you should be screened for this virus. You are considered at high risk for hepatitis B if:  You were born in a country where hepatitis B is common. Ask your health care provider which countries are considered high risk.  Your parents were born in a high-risk country, and you have not been immunized against hepatitis B (hepatitis B vaccine).  You have HIV or AIDS.  You use needles to inject street drugs.  You live with someone who has hepatitis B.  You have had sex with someone who has hepatitis B.  You get hemodialysis treatment.  You take certain medicines for conditions, including cancer, organ transplantation, and autoimmune conditions. Hepatitis C  Blood testing is recommended for:  Everyone born from [birth year] through 1967.  Anyone with known risk factors for hepatitis C. Sexually transmitted infections (STIs)  You should be screened for sexually transmitted infections (STIs) including gonorrhea and chlamydia if:  You are sexually active and are younger than 63 years of age.  You are older than 63 years of age and your health care provider tells you that you are at risk for this type of infection.  Your sexual activity has changed since you were last screened and you are at an increased risk for chlamydia or gonorrhea. Ask your health care provider if you are at risk.  If you do not have HIV, but are at risk, it may be recommended that you take a prescription medicine daily to prevent HIV infection. This is called pre-exposure prophylaxis (PrEP). You are considered at risk if:  You are sexually active and do not regularly use condoms or know the HIV status of your partner(s).  You take drugs by injection.  You are sexually active with a partner  who has HIV. Talk with your health care provider about whether you are at high risk of being infected with HIV. If you choose to begin PrEP, you should first be tested for HIV. You should then be tested every 3 months for as long as you are taking PrEP. Pregnancy  If you are premenopausal and you may become pregnant, ask your health care provider about preconception counseling.  If you may become pregnant, take 400 to 800 micrograms (mcg) of folic acid  every day.  If you want to prevent pregnancy, talk to your health care provider about birth control (contraception). Osteoporosis and menopause  Osteoporosis is a disease in which the bones lose minerals and strength with aging. This can result in serious bone fractures. Your risk for osteoporosis can be identified using a bone density scan.  If you are 4 years of age or older, or if you are at risk for osteoporosis and fractures, ask your health care provider if you should be screened.  Ask your health care provider whether you should take a calcium or vitamin D supplement to lower your risk for osteoporosis.  Menopause may have certain physical symptoms and risks.  Hormone replacement therapy may reduce some of these symptoms and risks. Talk to your health care provider about whether hormone replacement therapy is right for you. Follow these instructions at home:  Schedule regular health, dental, and eye exams.  Stay current with your immunizations.  Do not use any tobacco products including cigarettes, chewing tobacco, or electronic cigarettes.  If you are pregnant, do not drink alcohol.  If you are breastfeeding, limit how much and how often you drink alcohol.  Limit alcohol intake to no more than 1 drink per day for nonpregnant women. One drink equals 12 ounces of beer, 5 ounces of wine, or 1 ounces of hard liquor.  Do not use street drugs.  Do not share needles.  Ask your health care provider for help if you need support  or information about quitting drugs.  Tell your health care provider if you often feel depressed.  Tell your health care provider if you have ever been abused or do not feel safe at home. This information is not intended to replace advice given to you by your health care provider. Make sure you discuss any questions you have with your health care provider. Document Released: 12/06/2010 Document Revised: 10/29/2015 Document Reviewed: 02/24/2015 Elsevier Interactive Patient Education  2017 [employer].

## 2016-08-05 ENCOUNTER — Telehealth: Payer: Self-pay

## 2016-08-05 LAB — CBC WITH DIFFERENTIAL/PLATELET
BASOS ABS: 0 10*3/uL (ref 0.0–0.2)
Basos: 0 %
EOS (ABSOLUTE): 0.1 10*3/uL (ref 0.0–0.4)
EOS: 1 %
Hematocrit: 39.1 % (ref 34.0–46.6)
Hemoglobin: 12.7 g/dL (ref 11.1–15.9)
IMMATURE GRANULOCYTES: 0 %
Immature Grans (Abs): 0 10*3/uL (ref 0.0–0.1)
Lymphocytes Absolute: 1.4 10*3/uL (ref 0.7–3.1)
Lymphs: 24 %
MCH: 28 pg (ref 26.6–33.0)
MCHC: 32.5 g/dL (ref 31.5–35.7)
MCV: 86 fL (ref 79–97)
MONOS ABS: 0.3 10*3/uL (ref 0.1–0.9)
Monocytes: 6 %
NEUTROS PCT: 69 %
Neutrophils Absolute: 3.9 10*3/uL (ref 1.4–7.0)
PLATELETS: 195 10*3/uL (ref 150–379)
RBC: 4.54 x10E6/uL (ref 3.77–5.28)
RDW: 14 % (ref 12.3–15.4)
WBC: 5.7 10*3/uL (ref 3.4–10.8)

## 2016-08-05 LAB — COMPREHENSIVE METABOLIC PANEL
ALK PHOS: 96 IU/L (ref 39–117)
ALT: 17 IU/L (ref 0–32)
AST: 17 IU/L (ref 0–40)
Albumin/Globulin Ratio: 2 (ref 1.2–2.2)
Albumin: 4.5 g/dL (ref 3.6–4.8)
BUN/Creatinine Ratio: 13 (ref 12–28)
BUN: 18 mg/dL (ref 8–27)
Bilirubin Total: 0.5 mg/dL (ref 0.0–1.2)
CALCIUM: 9.3 mg/dL (ref 8.7–10.3)
CO2: 26 mmol/L (ref 18–29)
CREATININE: 1.42 mg/dL — AB (ref 0.57–1.00)
Chloride: 101 mmol/L (ref 96–106)
GFR calc Af Amer: 45 mL/min/{1.73_m2} — ABNORMAL LOW (ref >59)
GFR, EST NON AFRICAN AMERICAN: 39 mL/min/{1.73_m2} — AB (ref >59)
GLUCOSE: 98 mg/dL (ref 65–99)
Globulin, Total: 2.3 g/dL (ref 1.5–4.5)
Potassium: 4 mmol/L (ref 3.5–5.2)
Sodium: 143 mmol/L (ref 134–144)
Total Protein: 6.8 g/dL (ref 6.0–8.5)

## 2016-08-05 LAB — LIPID PANEL
CHOLESTEROL TOTAL: 199 mg/dL (ref 100–199)
Chol/HDL Ratio: 3.1 ratio units (ref 0.0–4.4)
HDL: 65 mg/dL (ref >39)
LDL Calculated: 112 mg/dL — ABNORMAL HIGH (ref 0–99)
Triglycerides: 109 mg/dL (ref 0–149)
VLDL CHOLESTEROL CAL: 22 mg/dL (ref 5–40)

## 2016-08-05 LAB — VITAMIN D 25 HYDROXY (VIT D DEFICIENCY, FRACTURES): VIT D 25 HYDROXY: 30.8 ng/mL (ref 30.0–100.0)

## 2016-08-05 LAB — TSH: TSH: 4.68 u[IU]/mL — ABNORMAL HIGH (ref 0.450–4.500)

## 2016-08-05 LAB — HEMOGLOBIN A1C
ESTIMATED AVERAGE GLUCOSE: 120 mg/dL
HEMOGLOBIN A1C: 5.8 % — AB (ref 4.8–5.6)

## 2016-08-05 NOTE — Telephone Encounter (Signed)
LMTCB  Thanks,  -Joseline 

## 2016-08-05 NOTE — Telephone Encounter (Signed)
-----   Message from Mar Daring, PA-C sent at 08/05/2016  8:19 AM EST ----- Renal function is slightly declined from last labs one year ago. I will send these to Dr. Candiss Norse. Recommend pushing fluids. All other labs are normal.

## 2016-08-08 NOTE — Telephone Encounter (Signed)
Patient advised as below. Patient verbalizes understanding and is in agreement with treatment plan.  

## 2016-09-16 ENCOUNTER — Ambulatory Visit (INDEPENDENT_AMBULATORY_CARE_PROVIDER_SITE_OTHER): Payer: BLUE CROSS/BLUE SHIELD | Admitting: Physician Assistant

## 2016-09-16 ENCOUNTER — Encounter: Payer: Self-pay | Admitting: Physician Assistant

## 2016-09-16 VITALS — BP 110/70 | HR 68 | Temp 98.2°F | Resp 16 | Wt 240.4 lb

## 2016-09-16 DIAGNOSIS — M109 Gout, unspecified: Secondary | ICD-10-CM | POA: Diagnosis not present

## 2016-09-16 MED ORDER — PREDNISONE 10 MG PO TABS: ORAL_TABLET | ORAL | 0 refills | Status: DC

## 2016-09-16 NOTE — Patient Instructions (Signed)

## 2016-09-16 NOTE — Progress Notes (Signed)
Patient: [name] Female    DOB: Oct 12, 1955   61 y.o.   MRN: [id number] Visit Date: 09/16/2016  Today's Provider: Mar Daring, PA-C   Chief Complaint  Patient presents with  . Rash   Subjective:    HPI Patient here today C/O swelling and redness on right big toe x's 2 days. Patient reports pain and burning. Pt denies itching or discharge. Patient denies any injuries. Patient reports she took ibuprofen 400 mg this morning.     Allergies  Allergen Reactions  . Penicillins     Swelling  Hives      Current Outpatient Prescriptions:  .  albuterol (VENTOLIN HFA) 108 (90 BASE) MCG/ACT inhaler, Inhale 2 puffs into the lungs as needed. , Disp: , Rfl:  .  Azelastine-Fluticasone (DYMISTA) 137-50 MCG/ACT SUSP, Place 2 sprays into the nose daily. , Disp: , Rfl:  .  furosemide (LASIX) 40 MG tablet, Take 40 mg by mouth 2 (two) times daily. , Disp: , Rfl:  .  levothyroxine (SYNTHROID, LEVOTHROID) 50 MCG tablet, Take 1 tablet by mouth daily., Disp: , Rfl: 5 .  loratadine (CLARITIN) 10 MG tablet, Take 10 mg by mouth daily. , Disp: , Rfl:  .  meloxicam (MOBIC) 7.5 MG tablet, TAKE ONE TABLET TWICE DAILY WITH MEALS, Disp: 60 tablet, Rfl: 3 .  metolazone (ZAROXOLYN) 5 MG tablet, Take 1 tablet by mouth as needed., Disp: , Rfl: 10 .  montelukast (SINGULAIR) 10 MG tablet, Take 10 mg by mouth at bedtime. , Disp: , Rfl:  .  NON FORMULARY, CPAP nightly., Disp: , Rfl:  .  olopatadine (PATANOL) 0.1 % ophthalmic solution, Place 1 drop into both eyes daily., Disp: , Rfl: 5 .  Omega-3 Fatty Acids (FISH OIL) 1200 MG CAPS, Take 2 capsules twice a day., Disp: , Rfl:  .  potassium chloride (MICRO-K) 10 MEQ CR capsule, Take 2 capsules (20 mEq total) by mouth daily., Disp: 60 capsule, Rfl: 5 .  ranitidine (ZANTAC) 150 MG tablet, TAKE ONE TABLET TWICE DAILY, Disp: 60 tablet, Rfl: 5 .  Vitamin D, Ergocalciferol, (DRISDOL) 50000 units CAPS capsule, TAKE 1 CAPSULE BY MOUTH EVERY 7 DAYS, Disp: 4  capsule, Rfl: 5  Review of Systems  Constitutional: Negative.   Respiratory: Negative.   Cardiovascular: Negative.   Gastrointestinal: Negative.   Musculoskeletal: Positive for arthralgias and joint swelling.  Skin: Positive for color change.  Neurological: Negative.     Social History  Substance Use Topics  . Smoking status: Never Smoker  . Smokeless tobacco: Never Used  . Alcohol use Yes   Objective:   BP 110/70 (BP Location: Left Arm, Patient Position: Sitting, Cuff Size: Large)   Pulse 68   Temp 98.2 F (36.8 C) (Oral)   Resp 16   Wt 240 lb 6.4 oz (109 kg)   SpO2 98%   BMI 36.55 kg/m  Vitals:   09/16/16 1554  BP: 110/70  Pulse: 68  Resp: 16  Temp: 98.2 F (36.8 C)  TempSrc: Oral  SpO2: 98%  Weight: 240 lb 6.4 oz (109 kg)     Physical Exam  Constitutional: She appears well-developed and well-nourished. No distress.  Cardiovascular: Normal rate, regular rhythm and normal heart sounds.  Exam reveals no gallop and no friction rub.   No murmur heard. Pulmonary/Chest: Effort normal and breath sounds normal. No respiratory distress. She has no wheezes. She has no rales.  Musculoskeletal: She exhibits no edema.  Right foot: There is decreased range of motion (1st toe), tenderness, swelling (1st toe) and deformity. There is no bony tenderness and normal capillary refill.       Left foot: Normal.       Feet:  Skin: She is not diaphoretic.  Vitals reviewed.       Assessment & Plan:     1. Acute gout involving toe of right foot, unspecified cause No history of gout but suspected. Last weekend patient was visiting family and had wine and lots of shrimp. Will check labs as below to verify and make sure not septic joint. Prednisone given as below. She is to call if no improvement. Also discussed since this is first time no need for a daily to prevent at this time, but if she has another gout flare may consider. She agrees.  - CBC w/Diff/Platelet - Uric acid -  predniSONE (DELTASONE) 10 MG tablet; Take 6 tabs PO on day 1&2, 5 tabs PO on day 3&4, 4 tabs PO on day 5&6, 3 tabs PO on day 7&8, 2 tabs PO on day 9&10, 1 tab PO on day 11&12.  Dispense: 42 tablet; Refill: 0       Mar Daring, PA-C  [facility]

## 2016-09-17 LAB — CBC WITH DIFFERENTIAL/PLATELET
BASOS ABS: 0 10*3/uL (ref 0.0–0.2)
Basos: 0 %
EOS (ABSOLUTE): 0.2 10*3/uL (ref 0.0–0.4)
Eos: 2 %
Hematocrit: 37.5 % (ref 34.0–46.6)
Hemoglobin: 12.2 g/dL (ref 11.1–15.9)
Immature Grans (Abs): 0 10*3/uL (ref 0.0–0.1)
Immature Granulocytes: 0 %
LYMPHS ABS: 1.2 10*3/uL (ref 0.7–3.1)
Lymphs: 18 %
MCH: 28.3 pg (ref 26.6–33.0)
MCHC: 32.5 g/dL (ref 31.5–35.7)
MCV: 87 fL (ref 79–97)
Monocytes Absolute: 0.5 10*3/uL (ref 0.1–0.9)
Monocytes: 7 %
Neutrophils Absolute: 5.1 10*3/uL (ref 1.4–7.0)
Neutrophils: 73 %
PLATELETS: 202 10*3/uL (ref 150–379)
RBC: 4.31 x10E6/uL (ref 3.77–5.28)
RDW: 14.4 % (ref 12.3–15.4)
WBC: 7 10*3/uL (ref 3.4–10.8)

## 2016-09-17 LAB — URIC ACID: URIC ACID: 8.4 mg/dL — AB (ref 2.5–7.1)

## 2016-09-19 ENCOUNTER — Telehealth: Payer: Self-pay

## 2016-09-19 NOTE — Telephone Encounter (Signed)
LMTCB  Thanks,  -Terral Cooks 

## 2016-09-19 NOTE — Telephone Encounter (Signed)
-----   Message from Mar Daring, [place] sent at 09/19/2016 10:02 AM EDT ----- Labs were consistent with gout flare. Uric acid was elevated at 8.4. See how patient is doing. Please let her know that if she has a second flare in a short period may consider adding a daily medication to help get rid of uric acid. Also warn her of foods that can flare gout such as shellfish, red meat, alcohol, etc. She should research food triggers to prevent future flares as well.

## 2016-09-22 NOTE — Telephone Encounter (Signed)
LMTCB ED 

## 2016-09-22 NOTE — Telephone Encounter (Signed)
Advised  ED 

## 2016-10-26 ENCOUNTER — Other Ambulatory Visit: Payer: Self-pay | Admitting: Family Medicine

## 2016-10-26 DIAGNOSIS — M109 Gout, unspecified: Secondary | ICD-10-CM

## 2016-10-26 MED ORDER — PREDNISONE 10 MG (21) PO TBPK: ORAL_TABLET | ORAL | 0 refills | Status: DC

## 2016-10-26 NOTE — Telephone Encounter (Signed)
Pt advised and RX sent in-aa 

## 2016-10-26 NOTE — Telephone Encounter (Signed)
Prednisone 10 mg -6 day taper then f/u with Anderson Malta

## 2016-10-26 NOTE — Telephone Encounter (Signed)
Patient was seen by Fenton Malling on 09/21/16 for gout in her toe and was treated with Prednisone.

## 2016-10-26 NOTE — Telephone Encounter (Signed)
Please review-aa 

## 2016-10-26 NOTE — Telephone Encounter (Signed)
Pt contacted office for refill request on the following medications:  predniSONE (DELTASONE) 10 MG tablet.  [name].  CB#[phone]/MW  Pt states she is having pain in her large right toe/MW

## 2016-12-09 ENCOUNTER — Other Ambulatory Visit: Payer: Self-pay | Admitting: Physician Assistant

## 2016-12-09 DIAGNOSIS — E559 Vitamin D deficiency, unspecified: Secondary | ICD-10-CM

## 2017-03-09 ENCOUNTER — Telehealth: Payer: Self-pay | Admitting: Physician Assistant

## 2017-03-09 ENCOUNTER — Other Ambulatory Visit: Payer: Self-pay | Admitting: Physician Assistant

## 2017-03-09 DIAGNOSIS — M109 Gout, unspecified: Secondary | ICD-10-CM

## 2017-03-09 MED ORDER — PREDNISONE 10 MG (21) PO TBPK: ORAL_TABLET | ORAL | 0 refills | Status: DC

## 2017-03-09 NOTE — Telephone Encounter (Signed)
Patient states that she is having a bad gout flare up and states that you and Dr. Rosanna Randy has told her if this happens to call and you could probably just send medication in instead of her coming in for a office visit.  She uses [facility].

## 2017-03-09 NOTE — Telephone Encounter (Signed)
Sent in prednisone. May need to be seen if no improvement once completed.

## 2017-03-09 NOTE — Telephone Encounter (Signed)
Pt advised-Anastasiya V Hopkins, RMA  

## 2017-03-23 ENCOUNTER — Encounter: Payer: Self-pay | Admitting: Physician Assistant

## 2017-03-23 ENCOUNTER — Ambulatory Visit (INDEPENDENT_AMBULATORY_CARE_PROVIDER_SITE_OTHER): Payer: BLUE CROSS/BLUE SHIELD | Admitting: Physician Assistant

## 2017-03-23 VITALS — BP 120/70 | HR 73 | Temp 97.7°F | Resp 16 | Wt 232.6 lb

## 2017-03-23 DIAGNOSIS — S93491A Sprain of other ligament of right ankle, initial encounter: Secondary | ICD-10-CM | POA: Diagnosis not present

## 2017-03-23 DIAGNOSIS — M79671 Pain in right foot: Secondary | ICD-10-CM | POA: Diagnosis not present

## 2017-03-23 DIAGNOSIS — M7661 Achilles tendinitis, right leg: Secondary | ICD-10-CM

## 2017-03-23 MED ORDER — TRAMADOL HCL 50 MG PO TABS: 50.0000 mg | ORAL_TABLET | Freq: Three times a day (TID) | ORAL | 0 refills | Status: DC | PRN

## 2017-03-23 NOTE — Progress Notes (Signed)
Patient: [name] Female    DOB: 1955/08/02   61 y.o.   MRN: [id number] Visit Date: 03/23/2017  Today's Provider: Mar Daring, PA-C   Chief Complaint  Patient presents with  . Foot Pain   Subjective:    Foot Pain  This is a new problem. The current episode started 1 to 4 weeks ago. The problem occurs daily. The problem has been gradually worsening. Associated symptoms include arthralgias and joint swelling. Pertinent negatives include no numbness or weakness. Associated symptoms comments: Heel Pain. The symptoms are aggravated by walking and standing (pain: is a strong dull pain in he back of the heel. She reports if she is standing and putting pressure it, pain "brings tears to my  eyes"). She has tried ice and rest (xray at urgent care. Was treated with Prednisone in case it was gout and ankle brace Friday 10/12. Advil in the past week. she has been taking 3 tablets in AM and 3 at night.) for the symptoms.  The urgent Care she went to on Friday is [facility].  She reports that on 03/08/17 had gout like symptoms in her great toe, prednisone was started on 03/09/17 6 day taper. Toe pain improved by end of taper. Then one day after finishing the taper they drove to [place] and she turned her ankle in an eversion motion on 03/15/17. Woke up in the middle of the night with intense ankle pain and swelling on Friday 03/17/17. She went to the local Urgent care and had xrays that were inconclusive due to fluid and swelling. He treated her with another prednisone taper and ankle swelling and ankle pain improved. She also used an ankle brace for support and compression. By 03/19/17 ankle pain and swelling were almost completely resolved. Then on Monday 03/20/17 she realized that the back of her heel (near the achilles insertion and achilles bursa are located) was painful and hurt to put her foot on the floor. Since it has been hard to walk normally due to heel pain.  Walking causes a sharp pain and burning sensation in the area noted above. She has been taking ibuprofen that is helping some.     Allergies  Allergen Reactions  . Penicillins     Swelling  Hives      Current Outpatient Prescriptions:  .  albuterol (VENTOLIN HFA) 108 (90 BASE) MCG/ACT inhaler, Inhale 2 puffs into the lungs as needed. , Disp: , Rfl:  .  Azelastine-Fluticasone (DYMISTA) 137-50 MCG/ACT SUSP, Place 2 sprays into the nose daily. , Disp: , Rfl:  .  furosemide (LASIX) 40 MG tablet, Take 40 mg by mouth 2 (two) times daily. , Disp: , Rfl:  .  levothyroxine (SYNTHROID, LEVOTHROID) 50 MCG tablet, Take 1 tablet by mouth daily., Disp: , Rfl: 5 .  loratadine (CLARITIN) 10 MG tablet, Take 10 mg by mouth daily. , Disp: , Rfl:  .  meloxicam (MOBIC) 7.5 MG tablet, TAKE ONE TABLET TWICE DAILY WITH MEALS, Disp: 60 tablet, Rfl: 3 .  metolazone (ZAROXOLYN) 5 MG tablet, Take 1 tablet by mouth as needed., Disp: , Rfl: 10 .  montelukast (SINGULAIR) 10 MG tablet, Take 10 mg by mouth at bedtime. , Disp: , Rfl:  .  NON FORMULARY, CPAP nightly., Disp: , Rfl:  .  olopatadine (PATANOL) 0.1 % ophthalmic solution, Place 1 drop into both eyes daily., Disp: , Rfl: 5 .  Omega-3 Fatty Acids (FISH OIL) 1200 MG CAPS,  Take 2 capsules twice a day., Disp: , Rfl:  .  potassium chloride (MICRO-K) 10 MEQ CR capsule, Take 2 capsules (20 mEq total) by mouth daily., Disp: 60 capsule, Rfl: 5 .  predniSONE (STERAPRED UNI-PAK 21 TAB) 10 MG (21) TBPK tablet, As directed, Disp: 21 tablet, Rfl: 0 .  ranitidine (ZANTAC) 150 MG tablet, TAKE ONE TABLET TWICE DAILY, Disp: 60 tablet, Rfl: 5 .  Vitamin D, Ergocalciferol, (DRISDOL) 50000 units CAPS capsule, TAKE 1 CAPSULE ONCE A WEEK, Disp: 4 capsule, Rfl: 5  Review of Systems  Constitutional: Negative.   Respiratory: Negative.   Cardiovascular: Negative.   Gastrointestinal: Negative.   Musculoskeletal: Positive for arthralgias, gait problem and joint swelling.    Neurological: Negative for weakness and numbness.    Social History  Substance Use Topics  . Smoking status: Never Smoker  . Smokeless tobacco: Never Used  . Alcohol use Yes   Objective:   Pulse 73   Temp 97.7 F (36.5 C) (Oral)   Resp 16   Wt 232 lb 9.6 oz (105.5 kg)   SpO2 97%   BMI 35.37 kg/m     Physical Exam  Constitutional: She appears well-developed and well-nourished. No distress.  Cardiovascular: Normal rate, regular rhythm and normal heart sounds.  Exam reveals no gallop and no friction rub.   No murmur heard. Pulmonary/Chest: Effort normal and breath sounds normal. No respiratory distress. She has no wheezes. She has no rales.  Musculoskeletal:       Right ankle: She exhibits normal range of motion, no swelling and normal pulse. No tenderness. Achilles tendon exhibits pain. Achilles tendon exhibits no defect and normal Thompson's test results.       Left ankle: Normal.       Right foot: Normal.       Left foot: Normal.  Only tender to palpation just over achilles insertion point/achilles bursa. Pain over area when putting stretch on achilles. ROM normal, strength 5/5, neurovasc grossly intact  Skin: She is not diaphoretic.  Vitals reviewed.       Assessment & Plan:     1. Sprain of anterior talofibular ligament of right ankle, initial encounter Initially had ankle sprain last week (weds 03/15/17 was day of injury) and had tenderness over anterior-inferior talofibular ligament. Xrays inconclusive. Bracing and prednisone taper resolved. Suspect secondary to bracing and possibly also could have been injured some from the eversion ankle sprain, I feel she has achilles tendinitis and bursitis. She is a patient of Dr. Vickki Muff, so I will refer back to him for further treatment. Tramadol given for pain relief. Discussed using topical hydrocortisone cream and placing on area of pain directly. Discussed heating pad with light ROM exercises and stretching. Will get new xray  to r/o bony abnormality since swelling is now subsided. She is to call the office if symptoms worsen. - DG Ankle Complete Right; Future  2. Pain of right heel See above medical treatment plan. - DG Ankle Complete Right; Future - Ambulatory referral to Podiatry  3. Achilles bursitis of right lower extremity See above medical treatment plan. - DG Ankle Complete Right; Future - Ambulatory referral to Podiatry - traMADol (ULTRAM) 50 MG tablet; Take 1 tablet (50 mg total) by mouth every 8 (eight) hours as needed.  Dispense: 30 tablet; Refill: 0       Mar Daring, PA-C  [facility]

## 2017-03-23 NOTE — Patient Instructions (Signed)
Bursitis Bursitis is inflammation and irritation of a bursa, which is one of the small, fluid-filled sacs that cushion and protect the moving parts of your body. These sacs are located between bones and muscles, muscle attachments, or skin areas next to bones. A bursa protects these structures from the wear and tear that results from frequent movement. An inflamed bursa causes pain and swelling. Fluid may build up inside the sac. Bursitis is most common near joints, especially the knees, elbows, hips, and shoulders. What are the causes? Bursitis can be caused by:  Injury from: ? A direct blow, like falling on your knee or elbow. ? Overuse of a joint (repetitive stress).  Infection. This can happen if bacteria gets into a bursa through a cut or scrape near a joint.  Diseases that cause joint inflammation, such as gout and rheumatoid arthritis.  What increases the risk? You may be at risk for bursitis if you:  Have a job or hobby that involves a lot of repetitive stress on your joints.  Have a condition that weakens your body's defense system (immune system), such as diabetes, cancer, or HIV.  Lift and reach overhead often.  Kneel or lean on hard surfaces often.  Run or walk often.  What are the signs or symptoms? The most common signs and symptoms of bursitis are:  Pain that gets worse when you move the affected body part or put weight on it.  Inflammation.  Stiffness.  Other signs and symptoms may include:  Redness.  Tenderness.  Warmth.  Pain that continues after rest.  Fever and chills. This may occur in bursitis caused by infection.  How is this diagnosed? Bursitis may be diagnosed by:  Medical history and physical exam.  MRI.  A procedure to drain fluid from the bursa with a needle (aspiration). The fluid may be checked for signs of infection or gout.  Blood tests to rule out other causes of inflammation.  How is this treated? Bursitis can usually be  treated at home with rest, ice, compression, and elevation (RICE). For mild bursitis, RICE treatment may be all you need. Other treatments may include:  Nonsteroidal anti-inflammatory drugs (NSAIDs) to treat pain and inflammation.  Corticosteroids to fight inflammation. You may have these drugs injected into and around the area of bursitis.  Aspiration of bursitis fluid to relieve pain and improve movement.  Antibiotic medicine to treat an infected bursa.  A splint, brace, or walking aid.  Physical therapy if you continue to have pain or limited movement.  Surgery to remove a damaged or infected bursa. This may be needed if you have a very bad case of bursitis or if other treatments have not worked.  Follow these instructions at home:  Take medicines only as directed by your health care provider.  If you were prescribed an antibiotic medicine, finish it all even if you start to feel better.  Rest the affected area as directed by your health care provider. ? Keep the area elevated. ? Avoid activities that make pain worse.  Apply ice to the injured area: ? Place ice in a plastic bag. ? Place a towel between your skin and the bag. ? Leave the ice on for 20 minutes, 2-3 times a day.  Use splints, braces, pads, or walking aids as directed by your health care provider.  Keep all follow-up visits as directed by your health care provider. This is important. How is this prevented?  Wear knee pads if you kneel often.    Wear sturdy running or walking shoes that fit you well.  Take regular breaks from repetitive activity.  Warm up by stretching before doing any strenuous activity.  Maintain a healthy weight or lose weight as recommended by your health care provider. Ask your health care provider if you need help.  Exercise regularly. Start any new physical activity gradually. Contact a health care provider if:  Your bursitis is not responding to treatment or home care.  You have  a fever.  You have chills. This information is not intended to replace advice given to you by your health care provider. Make sure you discuss any questions you have with your health care provider. Document Released: 05/20/2000 Document Revised: 10/29/2015 Document Reviewed: 08/12/2013 Elsevier Interactive Patient Education  2018 [place]. Achilles Tendinitis Achilles tendinitis is inflammation of the tough, cord-like band that attaches the lower leg muscles to the heel bone (Achilles tendon). This is usually caused by overusing the tendon and the ankle joint. Achilles tendinitis usually gets better over time with treatment and caring for yourself at home. It can take weeks or months to heal completely. What are the causes? This condition may be caused by:  A sudden increase in exercise or activity, such as running.  Doing the same exercises or activities (such as jumping) over and over.  Not warming up calf muscles before exercising.  Exercising in shoes that are worn out or not made for exercise.  Having arthritis or a bone growth (spur) on the back of the heel bone. This can rub against the tendon and hurt it.  Age-related wear and tear. Tendons become less flexible with age and more likely to be injured.  What are the signs or symptoms? Common symptoms of this condition include:  Pain in the Achilles tendon or in the back of the leg, just above the heel. The pain usually gets worse with exercise.  Stiffness or soreness in the back of the leg, especially in the morning.  Swelling of the skin over the Achilles tendon.  Thickening of the tendon.  Bone spurs at the bottom of the Achilles tendon, near the heel.  Trouble standing on tiptoe.  How is this diagnosed? This condition is diagnosed based on your symptoms and a physical exam. You may have tests, including:  X-rays.  MRI.  How is this treated? The goal of treatment is to relieve symptoms and help your injury  heal. Treatment may include:  Decreasing or stopping activities that caused the tendinitis. This may mean switching to low-impact exercises like biking or swimming.  Icing the injured area.  Doing physical therapy, including strengthening and stretching exercises.  NSAIDs to help relieve pain and swelling.  Using supportive shoes, wraps, heel lifts, or a walking boot (air cast).  Surgery. This may be done if your symptoms do not improve after 6 months.  Using high-energy shock wave impulses to stimulate the healing process (extracorporeal shock wave therapy). This is rare.  Injection of medicines to help relieve inflammation (corticosteroids). This is rare.  Follow these instructions at home: If you have an air cast:  Wear the cast as told by your health care provider. Remove it only as told by your health care provider.  Loosen the cast if your toes tingle, become numb, or turn cold and blue. Activity  Gradually return to your normal activities once your health care provider approves. Do not do activities that cause pain. ? Consider doing low-impact exercises, like cycling or swimming.  If you  have an air cast, ask your health care provider when it is safe for you to drive.  If physical therapy was prescribed, do exercises as told by your health care provider or physical therapist. Managing pain, stiffness, and swelling  Raise (elevate) your foot above the level of your heart while you are sitting or lying down.  Move your toes often to avoid stiffness and to lessen swelling.  If directed, put ice on the injured area: ? Put ice in a plastic bag. ? Place a towel between your skin and the bag. ? Leave the ice on for 20 minutes, 2-3 times a day General instructions  If directed, wrap your foot with an elastic bandage or other wrap. This can help keep your tendon from moving too much while it heals. Your health care provider will show you how to wrap your foot  correctly.  Wear supportive shoes or heel lifts only as told by your health care provider.  Take over-the-counter and prescription medicines only as told by your health care provider.  Keep all follow-up visits as told by your health care provider. This is important. Contact a health care provider if:  You have symptoms that gets worse.  You have pain that does not get better with medicine.  You develop new, unexplained symptoms.  You develop warmth and swelling in your foot.  You have a fever. Get help right away if:  You have a sudden popping sound or sensation in your Achilles tendon followed by severe pain.  You cannot move your toes or foot.  You cannot put any weight on your foot. Summary  Achilles tendinitis is inflammation of the tough, cord-like band that attaches the lower leg muscles to the heel bone (Achilles tendon).  This condition is usually caused by overusing the tendon and the ankle joint. It can also be caused by arthritis or normal aging.  The most common symptoms of this condition include pain, swelling, or stiffness in the Achilles tendon or in the back of the leg.  This condition is usually treated with rest, NSAIDs, and physical therapy. This information is not intended to replace advice given to you by your health care provider. Make sure you discuss any questions you have with your health care provider. Document Released: 03/02/2005 Document Revised: 04/11/2016 Document Reviewed: 04/11/2016 Elsevier Interactive Patient Education  2017 [employer].

## 2017-03-24 ENCOUNTER — Other Ambulatory Visit: Payer: Self-pay | Admitting: Physician Assistant

## 2017-03-24 ENCOUNTER — Encounter: Payer: Self-pay | Admitting: Physician Assistant

## 2017-03-24 DIAGNOSIS — M7661 Achilles tendinitis, right leg: Secondary | ICD-10-CM

## 2017-03-24 DIAGNOSIS — M545 Low back pain, unspecified: Secondary | ICD-10-CM

## 2017-03-24 DIAGNOSIS — G8929 Other chronic pain: Secondary | ICD-10-CM

## 2017-03-24 DIAGNOSIS — M109 Gout, unspecified: Secondary | ICD-10-CM

## 2017-03-24 MED ORDER — PREDNISONE 10 MG (21) PO TBPK: ORAL_TABLET | ORAL | 0 refills | Status: DC

## 2017-04-18 ENCOUNTER — Other Ambulatory Visit: Payer: Self-pay | Admitting: Otolaryngology

## 2017-04-18 DIAGNOSIS — E041 Nontoxic single thyroid nodule: Secondary | ICD-10-CM

## 2017-04-20 ENCOUNTER — Ambulatory Visit: Payer: BLUE CROSS/BLUE SHIELD

## 2017-04-25 ENCOUNTER — Ambulatory Visit: Payer: BLUE CROSS/BLUE SHIELD

## 2017-05-01 ENCOUNTER — Ambulatory Visit
Admission: RE | Admit: 2017-05-01 | Discharge: 2017-05-01 | Disposition: A | Discharge status: Home / Self Care | Payer: BLUE CROSS/BLUE SHIELD | Source: Ambulatory Visit | Attending: Otolaryngology | Admitting: Otolaryngology

## 2017-05-01 DIAGNOSIS — E041 Nontoxic single thyroid nodule: Secondary | ICD-10-CM | POA: Diagnosis present

## 2017-05-01 DIAGNOSIS — E042 Nontoxic multinodular goiter: Secondary | ICD-10-CM | POA: Insufficient documentation

## 2017-05-22 ENCOUNTER — Telehealth: Payer: Self-pay | Admitting: Physician Assistant

## 2017-05-22 DIAGNOSIS — M1A09X0 Idiopathic chronic gout, multiple sites, without tophus (tophi): Secondary | ICD-10-CM

## 2017-05-22 NOTE — Telephone Encounter (Signed)
Pt called back wanting the allopurinol for gout.  She uses [facility]  Her call back is [phone]  Thanks teri

## 2017-05-23 MED ORDER — ALLOPURINOL 100 MG PO TABS: 100.0000 mg | ORAL_TABLET | Freq: Every day | ORAL | 1 refills | Status: DC

## 2017-05-23 NOTE — Telephone Encounter (Signed)
Patient reports she started medication on 03/17/17 pt reports she got this filled in [place]. Patient reports that she is aware of side effects. Patient is requesting refill on allopurinol 10 mg.

## 2017-05-23 NOTE — Telephone Encounter (Signed)
It may be best if she comes in for an appt for me to start this as there are some things I need to tell her about the medication before starting.

## 2017-05-23 NOTE — Telephone Encounter (Signed)
refilled 

## 2017-06-14 ENCOUNTER — Other Ambulatory Visit: Payer: Self-pay | Admitting: Physician Assistant

## 2017-06-14 DIAGNOSIS — E559 Vitamin D deficiency, unspecified: Secondary | ICD-10-CM

## 2017-07-17 ENCOUNTER — Other Ambulatory Visit: Payer: Self-pay | Admitting: Physician Assistant

## 2017-07-17 DIAGNOSIS — M545 Low back pain: Principal | ICD-10-CM

## 2017-07-17 DIAGNOSIS — G8929 Other chronic pain: Secondary | ICD-10-CM

## 2017-09-22 ENCOUNTER — Other Ambulatory Visit: Payer: Self-pay | Admitting: Family Medicine

## 2017-09-22 NOTE — Telephone Encounter (Signed)
Hope you have a good day

## 2017-12-26 ENCOUNTER — Other Ambulatory Visit: Payer: Self-pay | Admitting: Physician Assistant

## 2017-12-26 DIAGNOSIS — E559 Vitamin D deficiency, unspecified: Secondary | ICD-10-CM

## 2017-12-26 DIAGNOSIS — M1A09X0 Idiopathic chronic gout, multiple sites, without tophus (tophi): Secondary | ICD-10-CM

## 2017-12-26 NOTE — Telephone Encounter (Signed)
Patient needs CPE, past due in March

## 2018-01-22 ENCOUNTER — Other Ambulatory Visit: Payer: Self-pay | Admitting: Physician Assistant

## 2018-01-22 DIAGNOSIS — M1A09X0 Idiopathic chronic gout, multiple sites, without tophus (tophi): Secondary | ICD-10-CM

## 2018-02-23 ENCOUNTER — Other Ambulatory Visit: Payer: Self-pay | Admitting: Physician Assistant

## 2018-02-23 DIAGNOSIS — E559 Vitamin D deficiency, unspecified: Secondary | ICD-10-CM

## 2018-02-23 NOTE — Telephone Encounter (Signed)
Patient was told in July she needs to schedule CPE before more refills. No refill until seen

## 2018-02-23 NOTE — Telephone Encounter (Signed)
Please review. Thanks!  

## 2018-02-23 NOTE — Telephone Encounter (Signed)
[facility] faxed a refill request for the following medication. Thanks CC  Vitamin D, Ergocalciferol, (DRISDOL) 50000 units CAPS capsule

## 2018-04-11 ENCOUNTER — Other Ambulatory Visit: Payer: Self-pay | Admitting: Physician Assistant

## 2018-04-11 DIAGNOSIS — R600 Localized edema: Secondary | ICD-10-CM

## 2018-04-11 DIAGNOSIS — N183 Chronic kidney disease, stage 3 unspecified: Secondary | ICD-10-CM

## 2018-06-25 ENCOUNTER — Ambulatory Visit: Payer: BLUE CROSS/BLUE SHIELD | Admitting: Physician Assistant

## 2018-06-25 DIAGNOSIS — S93622A Sprain of tarsometatarsal ligament of left foot, initial encounter: Secondary | ICD-10-CM | POA: Diagnosis not present

## 2018-07-05 DIAGNOSIS — M10071 Idiopathic gout, right ankle and foot: Secondary | ICD-10-CM | POA: Diagnosis not present

## 2018-07-05 DIAGNOSIS — M109 Gout, unspecified: Secondary | ICD-10-CM | POA: Diagnosis not present

## 2018-07-12 DIAGNOSIS — M10071 Idiopathic gout, right ankle and foot: Secondary | ICD-10-CM | POA: Diagnosis not present

## 2018-07-12 DIAGNOSIS — M109 Gout, unspecified: Secondary | ICD-10-CM | POA: Diagnosis not present

## 2018-07-18 ENCOUNTER — Telehealth: Payer: Self-pay | Admitting: Physician Assistant

## 2018-07-18 ENCOUNTER — Ambulatory Visit (INDEPENDENT_AMBULATORY_CARE_PROVIDER_SITE_OTHER): Payer: PPO | Admitting: Physician Assistant

## 2018-07-18 ENCOUNTER — Encounter: Payer: Self-pay | Admitting: Physician Assistant

## 2018-07-18 VITALS — BP 141/85 | HR 72 | Ht 68.0 in | Wt 229.0 lb

## 2018-07-18 DIAGNOSIS — E6609 Other obesity due to excess calories: Secondary | ICD-10-CM

## 2018-07-18 DIAGNOSIS — E039 Hypothyroidism, unspecified: Secondary | ICD-10-CM | POA: Diagnosis not present

## 2018-07-18 DIAGNOSIS — N183 Chronic kidney disease, stage 3 unspecified: Secondary | ICD-10-CM

## 2018-07-18 DIAGNOSIS — M1A09X0 Idiopathic chronic gout, multiple sites, without tophus (tophi): Secondary | ICD-10-CM | POA: Diagnosis not present

## 2018-07-18 DIAGNOSIS — G4733 Obstructive sleep apnea (adult) (pediatric): Secondary | ICD-10-CM | POA: Diagnosis not present

## 2018-07-18 DIAGNOSIS — Z0000 Encounter for general adult medical examination without abnormal findings: Secondary | ICD-10-CM | POA: Diagnosis not present

## 2018-07-18 DIAGNOSIS — Z6834 Body mass index (BMI) 34.0-34.9, adult: Secondary | ICD-10-CM

## 2018-07-18 DIAGNOSIS — R600 Localized edema: Secondary | ICD-10-CM

## 2018-07-18 DIAGNOSIS — Z1231 Encounter for screening mammogram for malignant neoplasm of breast: Secondary | ICD-10-CM

## 2018-07-18 DIAGNOSIS — E66811 Obesity, class 1: Secondary | ICD-10-CM

## 2018-07-18 DIAGNOSIS — Z2821 Immunization not carried out because of patient refusal: Secondary | ICD-10-CM | POA: Diagnosis not present

## 2018-07-18 DIAGNOSIS — J301 Allergic rhinitis due to pollen: Secondary | ICD-10-CM | POA: Diagnosis not present

## 2018-07-18 DIAGNOSIS — E559 Vitamin D deficiency, unspecified: Secondary | ICD-10-CM | POA: Diagnosis not present

## 2018-07-18 DIAGNOSIS — J4520 Mild intermittent asthma, uncomplicated: Secondary | ICD-10-CM

## 2018-07-18 MED ORDER — VITAMIN D (ERGOCALCIFEROL) 1.25 MG (50000 UNIT) PO CAPS: 50000.0000 [IU] | ORAL_CAPSULE | ORAL | 0 refills | Status: DC

## 2018-07-18 MED ORDER — ALBUTEROL SULFATE HFA 108 (90 BASE) MCG/ACT IN AERS: 2.0000 | INHALATION_SPRAY | Freq: Four times a day (QID) | RESPIRATORY_TRACT | 1 refills | Status: DC | PRN

## 2018-07-18 MED ORDER — AZELASTINE-FLUTICASONE 137-50 MCG/ACT NA SUSP: 2.0000 | Freq: Every day | NASAL | 1 refills | Status: DC

## 2018-07-18 MED ORDER — LEVOTHYROXINE SODIUM 50 MCG PO TABS: 50.0000 ug | ORAL_TABLET | Freq: Every day | ORAL | 1 refills | Status: DC

## 2018-07-18 MED ORDER — ALLOPURINOL 100 MG PO TABS: 100.0000 mg | ORAL_TABLET | Freq: Every day | ORAL | 1 refills | Status: DC

## 2018-07-18 MED ORDER — POTASSIUM CHLORIDE ER 10 MEQ PO TBCR: EXTENDED_RELEASE_TABLET | ORAL | 1 refills | Status: DC

## 2018-07-18 MED ORDER — FUROSEMIDE 40 MG PO TABS: 40.0000 mg | ORAL_TABLET | Freq: Two times a day (BID) | ORAL | 1 refills | Status: DC

## 2018-07-18 MED ORDER — METOLAZONE 5 MG PO TABS: 5.0000 mg | ORAL_TABLET | Freq: Every day | ORAL | 1 refills | Status: DC | PRN

## 2018-07-18 MED ORDER — MONTELUKAST SODIUM 10 MG PO TABS: 10.0000 mg | ORAL_TABLET | Freq: Every day | ORAL | 1 refills | Status: DC

## 2018-07-18 NOTE — Telephone Encounter (Signed)
[facility] wants to know if they can substitute the Dymista and it would be 90.00 copay.  Can they use Flonase.  She has used this in the past.  Thanks teri

## 2018-07-18 NOTE — Telephone Encounter (Signed)
LM at [facility]

## 2018-07-18 NOTE — Telephone Encounter (Signed)
Yes

## 2018-07-18 NOTE — Progress Notes (Signed)
Patient: [name], Female    DOB: 1956-04-01, 63 y.o.   MRN: [id number] Visit Date: 07/18/2018  Today's Provider: Mar Daring, PA-C   Chief Complaint  Patient presents with  . Medicare Wellness   Subjective:     Annual wellness visit [name] is a 63 y.o. female. She feels  well. She reports exercising includes walking. She reports she is sleeping well. -----------------------------------------------------------   Review of Systems  Constitutional: Negative.   HENT: Negative.   Eyes: Negative.   Respiratory: Negative.   Cardiovascular: Negative.   Gastrointestinal: Negative.   Endocrine: Negative.   Genitourinary: Negative.   Musculoskeletal: Negative.   Skin: Negative.   Allergic/Immunologic: Negative.   Neurological: Negative.   Hematological: Negative.   Psychiatric/Behavioral: Negative.     Social History   Socioeconomic History  . Marital status: Married    Spouse name: Not on file  . Number of children: 3  . Years of education: Not on file  . Highest education level: Not on file  Occupational History    Comment: self employeed  Social Needs  . Financial resource strain: Not on file  . Food insecurity:    Worry: Not on file    Inability: Not on file  . Transportation needs:    Medical: Not on file    Non-medical: Not on file  Tobacco Use  . Smoking status: Never Smoker  . Smokeless tobacco: Never Used  Substance and Sexual Activity  . Alcohol use: Yes  . Drug use: No  . Sexual activity: Not on file  Lifestyle  . Physical activity:    Days per week: Not on file    Minutes per session: Not on file  . Stress: Not on file  Relationships  . Social connections:    Talks on phone: Not on file    Gets together: Not on file    Attends religious service: Not on file    Active member of club or organization: Not on file    Attends meetings of clubs or organizations: Not on file    Relationship status: Not on file  .  Intimate partner violence:    Fear of current or ex partner: Not on file    Emotionally abused: Not on file    Physically abused: Not on file    Forced sexual activity: Not on file  Other Topics Concern  . Not on file  Social History Narrative  . Not on file    Past Medical History:  Diagnosis Date  . Cervical cancer ([place])   . Chronic kidney disease, stage III (moderate) (H[facility])   . Foot fracture 2013  . Hiatal hernia   . Hypopotassemia   . OSA on CPAP   . Other abnormal glucose   . Pure hypercholesterolemia   . Reflux esophagitis   . Sleep apnea   . Thyroid fullness   . Thyroid nodule   . Unspecified vitamin D deficiency      Patient Active Problem List   Diagnosis Date Noted  . Abortion, spontaneous 01/28/2015  . Allergic rhinitis 01/28/2015  . Angular blepharoconjunctivitis 01/28/2015  . Asthma 01/28/2015  . Cervical cancer ([place]) 01/28/2015  . Chronic kidney disease (CKD), stage III (moderate) ([place]) 01/28/2015  . CD (contact dermatitis) 01/28/2015  . Dermatitis, eczematoid 01/28/2015  . Edema 01/28/2015  . Esophagitis, reflux 01/28/2015  . Exposure to viral hepatitis 01/28/2015  . Closed fracture of foot 01/28/2015  . Hiatal hernia  01/28/2015  . Decreased potassium in the blood 01/28/2015  . Adult hypothyroidism 01/28/2015  . Nonspecific ST-T changes 01/28/2015  . Obstructive sleep apnea 01/28/2015  . Borderline diabetes 01/28/2015  . Hypercholesterolemia without hypertriglyceridemia 01/28/2015  . Atrophy of thyroid 01/28/2015  . Thyroid nodule 01/28/2015  . Vitamin D deficiency 01/28/2015  . SHORTNESS OF BREATH 02/17/2010    Past Surgical History:  Procedure Laterality Date  . CHOLECYSTECTOMY    . ELBOW FRACTURE SURGERY Right   . FOOT SURGERY Left 03/2010  . GALLBLADDER SURGERY    . GANGLION CYST EXCISION    . TOTAL VAGINAL HYSTERECTOMY      Her family history includes CAD in her father; Diabetes in her brother; Heart attack (age of onset: 45) in her  father; Heart disease in her father; Hypertension in her brother.   Current Outpatient Medications:  .  albuterol (VENTOLIN HFA) 108 (90 BASE) MCG/ACT inhaler, Inhale 2 puffs into the lungs as needed. , Disp: , Rfl:  .  allopurinol (ZYLOPRIM) 100 MG tablet, Take 1 tablet (100 mg total) by mouth daily., Disp: 90 tablet, Rfl: 1 .  Azelastine-Fluticasone (DYMISTA) 137-50 MCG/ACT SUSP, Place 2 sprays into the nose daily. , Disp: , Rfl:  .  furosemide (LASIX) 40 MG tablet, Take 40 mg by mouth 2 (two) times daily. , Disp: , Rfl:  .  levothyroxine (SYNTHROID, LEVOTHROID) 50 MCG tablet, Take 1 tablet by mouth daily., Disp: , Rfl: 5 .  loratadine (CLARITIN) 10 MG tablet, Take 10 mg by mouth daily. , Disp: , Rfl:  .  metolazone (ZAROXOLYN) 5 MG tablet, Take 1 tablet by mouth as needed., Disp: , Rfl: 10 .  montelukast (SINGULAIR) 10 MG tablet, Take 10 mg by mouth at bedtime. , Disp: , Rfl:  .  NON FORMULARY, CPAP nightly., Disp: , Rfl:  .  olopatadine (PATANOL) 0.1 % ophthalmic solution, Place 1 drop into both eyes daily., Disp: , Rfl: 5 .  potassium chloride (K-DUR) 10 MEQ tablet, TAKE TWO TABLETS BY MOUTH TWO TIMES A DAY, Disp: 120 tablet, Rfl: 1 .  Vitamin D, Ergocalciferol, (DRISDOL) 50000 units CAPS capsule, TAKE 1 CAPSULE BY MOUTH ONCE A WEEK, Disp: 4 capsule, Rfl: 0 .  meloxicam (MOBIC) 7.5 MG tablet, Take 1 tablet (7.5 mg total) by mouth 2 (two) times daily. Do NOT take with prednisone, ibuprofen, naproxen or any other NSAIDs. (Patient not taking: Reported on 07/18/2018), Disp: 60 tablet, Rfl: 1 .  Omega-3 Fatty Acids (FISH OIL) 1200 MG CAPS, Take 2 capsules twice a day., Disp: , Rfl:  .  predniSONE (STERAPRED UNI-PAK 21 TAB) 10 MG (21) TBPK tablet, Take as directed on package instructions; 6 day taper (Patient not taking: Reported on 07/18/2018), Disp: 21 tablet, Rfl: 0 .  ranitidine (ZANTAC) 150 MG tablet, TAKE ONE TABLET TWICE DAILY (Patient not taking: Reported on 07/18/2018), Disp: 60 tablet, Rfl:  4 .  traMADol (ULTRAM) 50 MG tablet, Take 1 tablet (50 mg total) by mouth every 8 (eight) hours as needed. (Patient not taking: Reported on 07/18/2018), Disp: 30 tablet, Rfl: 0  Patient Care Team: Mar Daring, PA-C as PCP - General (Family Medicine)    Objective:    Vitals: BP (!) 141/85 (BP Location: Left Arm, Patient Position: Sitting, Cuff Size: Normal)   Pulse 72   Ht 5\' 8"  (1.727 m)   Wt 229 lb (103.9 kg)   BMI 34.82 kg/m   Physical Exam Vitals signs reviewed.  Constitutional:  General: She is not in acute distress.    Appearance: She is well-developed. She is obese. She is not diaphoretic.  HENT:     Head: Normocephalic and atraumatic.     Right Ear: Tympanic membrane, ear canal and external ear normal.     Left Ear: Tympanic membrane, ear canal and external ear normal.     Nose: Nose normal.     Mouth/Throat:     Pharynx: No oropharyngeal exudate.  Eyes:     General: No scleral icterus.       Right eye: No discharge.        Left eye: No discharge.     Conjunctiva/sclera: Conjunctivae normal.     Pupils: Pupils are equal, round, and reactive to light.  Neck:     Musculoskeletal: Normal range of motion and neck supple.     Thyroid: No thyromegaly.     Vascular: No JVD.     Trachea: No tracheal deviation.  Cardiovascular:     Rate and Rhythm: Normal rate and regular rhythm.     Heart sounds: Normal heart sounds. No murmur. No friction rub. No gallop.   Pulmonary:     Effort: Pulmonary effort is normal. No respiratory distress.     Breath sounds: Normal breath sounds. No wheezing or rales.  Chest:     Chest wall: No tenderness.  Abdominal:     General: Bowel sounds are normal. There is no distension.     Palpations: Abdomen is soft. There is no mass.     Tenderness: There is no abdominal tenderness. There is no guarding or rebound.  Musculoskeletal: Normal range of motion.        General: No tenderness.  Lymphadenopathy:     Cervical: No cervical  adenopathy.  Skin:    General: Skin is warm and dry.     Findings: No rash.  Neurological:     Mental Status: She is alert and oriented to person, place, and time.  Psychiatric:        Behavior: Behavior normal.        Thought Content: Thought content normal.        Judgment: Judgment normal.     Activities of Daily Living In your present state of health, do you have any difficulty performing the following activities: 07/18/2018  Hearing? N  Vision? N  Difficulty concentrating or making decisions? N  Walking or climbing stairs? N  Dressing or bathing? N  Doing errands, shopping? N  Some recent data might be hidden    Fall Risk Assessment Fall Risk  07/18/2018 07/18/2018 08/04/2016  Falls in the past year? 1 1 Yes  Number falls in past yr: 0 0 -  Injury with Fall? 1 0 Yes     Depression Screen PHQ 2/9 Scores 07/18/2018 08/04/2016  PHQ - 2 Score 0 0  PHQ- 9 Score 0 0    No flowsheet data found.    Assessment & Plan:     Annual Wellness Visit  Reviewed patient's Family Medical History Reviewed and updated list of patient's medical providers Assessment of cognitive impairment was done Assessed patient's functional ability Established a written schedule for health screening [place] Completed and Reviewed  Exercise Activities and Dietary recommendations Goals   None     Immunization History  Administered Date(s) Administered  . Influenza Split 04/19/2006, 07/17/2009  . Influenza,inj,Quad PF,6+ Mos 02/13/2013, 06/24/2015  . Pneumococcal Conjugate-13 06/24/2015  . Pneumococcal Polysaccharide-23 02/13/2013  . Tdap 03/27/2008  .  Zoster 08/03/2012    Health Maintenance  Topic Date Due  . Hepatitis C Screening  07-25-1955  . HIV Screening  06/28/1968  . MAMMOGRAM  06/29/2003  . INFLUENZA VACCINE  01/04/2018  . TETANUS/TDAP  03/27/2018  . PNA vac Low Risk Adult (2 of 2 - PPSV23) 06/28/2018  . COLONOSCOPY  11/14/2018  . DEXA SCAN   Completed  . PAP SMEAR-Modifier  Discontinued     Discussed health benefits of physical activity, and encouraged her to engage in regular exercise appropriate for her age and condition.    1. Welcome to Medicare preventive visit Normal exam today. EKG today showed NSR rate of 67 with nondiagnostic RSR in V1.  - EKG 12-Lead  2. Encounter for screening mammogram for breast cancer Breast exam today was normal. There is no family history of breast cancer. She does perform regular self breast exams. Mammogram was ordered as below. Information for [facility] was given to patient so she may schedule her mammogram at her convenience. - MM 3D SCREEN BREAST BILATERAL; Future  3. Chronic gout of multiple sites, unspecified cause Stable. Diagnosis pulled for medication refill. Continue current medical treatment plan. - allopurinol (ZYLOPRIM) 100 MG tablet; Take 1 tablet (100 mg total) by mouth daily.  Dispense: 90 tablet; Refill: 1  4. Chronic kidney disease (CKD), stage III (moderate) (HCC) Stable. Diagnosis pulled for medication refill. Continue current medical treatment plan. - potassium chloride (K-DUR) 10 MEQ tablet; TAKE TWO TABLETS BY MOUTH TWO TIMES A DAY  Dispense: 360 tablet; Refill: 1 - metolazone (ZAROXOLYN) 5 MG tablet; Take 1 tablet (5 mg total) by mouth daily as needed.  Dispense: 90 tablet; Refill: 1 - furosemide (LASIX) 40 MG tablet; Take 1 tablet (40 mg total) by mouth 2 (two) times daily.  Dispense: 180 tablet; Refill: 1  5. Lower extremity edema Stable. Diagnosis pulled for medication refill. Continue current medical treatment plan. - potassium chloride (K-DUR) 10 MEQ tablet; TAKE TWO TABLETS BY MOUTH TWO TIMES A DAY  Dispense: 360 tablet; Refill: 1  6. Vitamin D deficiency Stable. Diagnosis pulled for medication refill. Continue current medical treatment plan. - Vitamin D, Ergocalciferol, (DRISDOL) 1.25 MG (50000 UT) CAPS capsule; Take 1 capsule (50,000 Units  total) by mouth once a week.  Dispense: 4 capsule; Refill: 0  7. Obstructive sleep apnea Uses nightly without issue. Has been stable for 14 years. Patient needs new APAP machine pressures 4-20cmH2O. New Rx written and faxed to [facility].   8. Class 1 obesity due to excess calories with serious comorbidity and body mass index (BMI) of 34.0 to 34.9 in adult Counseled patient on healthy lifestyle modifications including dieting and exercise.   9. Seasonal allergic rhinitis due to pollen Stable. Diagnosis pulled for medication refill. Continue current medical treatment plan. - montelukast (SINGULAIR) 10 MG tablet; Take 1 tablet (10 mg total) by mouth at bedtime.  Dispense: 90 tablet; Refill: 1 - Azelastine-Fluticasone (DYMISTA) 137-50 MCG/ACT SUSP; Place 2 sprays into the nose daily.  Dispense: 23 g; Refill: 1  10. Adult hypothyroidism Stable. Diagnosis pulled for medication refill. Continue current medical treatment plan. - levothyroxine (SYNTHROID, LEVOTHROID) 50 MCG tablet; Take 1 tablet (50 mcg total) by mouth daily before breakfast.  Dispense: 90 tablet; Refill: 1  11. Mild intermittent asthma without complication Stable. Diagnosis pulled for medication refill. Continue current medical treatment plan. - albuterol (VENTOLIN HFA) 108 (90 Base) MCG/ACT inhaler; Inhale 2 puffs into the lungs every 6 (six) hours as needed.  Dispense: 18 g; Refill: 1 - montelukast (SINGULAIR) 10 MG tablet; Take 1 tablet (10 mg total) by mouth at bedtime.  Dispense: 90 tablet; Refill: 1  13. Influenza vaccination declined Patient declines all vaccinations today.   ------------------------------------------------------------------------------------------------------------    Mar Daring, PA-C  [facility]

## 2018-07-18 NOTE — Telephone Encounter (Signed)
Please Review

## 2018-07-18 NOTE — Patient Instructions (Signed)

## 2018-08-14 ENCOUNTER — Other Ambulatory Visit: Payer: Self-pay | Admitting: Physician Assistant

## 2018-08-14 DIAGNOSIS — E559 Vitamin D deficiency, unspecified: Secondary | ICD-10-CM

## 2018-08-17 ENCOUNTER — Encounter: Payer: Self-pay | Admitting: Physician Assistant

## 2018-08-17 DIAGNOSIS — E034 Atrophy of thyroid (acquired): Secondary | ICD-10-CM

## 2018-08-17 DIAGNOSIS — R7303 Prediabetes: Secondary | ICD-10-CM

## 2018-08-17 DIAGNOSIS — E7800 Pure hypercholesterolemia, unspecified: Secondary | ICD-10-CM

## 2018-08-17 DIAGNOSIS — N183 Chronic kidney disease, stage 3 unspecified: Secondary | ICD-10-CM

## 2018-08-17 DIAGNOSIS — E559 Vitamin D deficiency, unspecified: Secondary | ICD-10-CM

## 2018-09-19 ENCOUNTER — Encounter: Payer: Self-pay | Admitting: Physician Assistant

## 2018-09-26 ENCOUNTER — Other Ambulatory Visit: Payer: Self-pay | Admitting: Physician Assistant

## 2018-09-26 DIAGNOSIS — J301 Allergic rhinitis due to pollen: Secondary | ICD-10-CM

## 2018-10-17 ENCOUNTER — Encounter: Payer: Self-pay | Admitting: Physician Assistant

## 2018-10-17 NOTE — Telephone Encounter (Signed)
Please see if flu vaccine can be abstracted from 10/08/18 from [facility]

## 2018-11-05 ENCOUNTER — Encounter: Payer: Self-pay | Admitting: Physician Assistant

## 2018-11-06 ENCOUNTER — Telehealth: Payer: Self-pay

## 2018-11-06 DIAGNOSIS — Z1211 Encounter for screening for malignant neoplasm of colon: Secondary | ICD-10-CM

## 2018-11-06 NOTE — Telephone Encounter (Signed)
Patient's last Colonoscopy was 11/13/2008 she is due for one. [facility] [facility] reached out to her about the colorectal kit.

## 2018-11-06 NOTE — Telephone Encounter (Signed)
Cologuard ordered

## 2018-12-03 ENCOUNTER — Telehealth: Payer: Self-pay

## 2018-12-03 NOTE — Telephone Encounter (Signed)
Pt states she is due for lab work.  She was not able to get it in January at her physical.  Please call when ready.  [phone]   Thanks,   -Mickel Baas

## 2018-12-04 NOTE — Telephone Encounter (Signed)
Lab slip printed. 

## 2018-12-04 NOTE — Telephone Encounter (Signed)
Patient advised.

## 2018-12-10 DIAGNOSIS — R7303 Prediabetes: Secondary | ICD-10-CM | POA: Diagnosis not present

## 2018-12-10 DIAGNOSIS — Z1211 Encounter for screening for malignant neoplasm of colon: Secondary | ICD-10-CM | POA: Diagnosis not present

## 2018-12-10 DIAGNOSIS — E559 Vitamin D deficiency, unspecified: Secondary | ICD-10-CM | POA: Diagnosis not present

## 2018-12-10 DIAGNOSIS — E7800 Pure hypercholesterolemia, unspecified: Secondary | ICD-10-CM | POA: Diagnosis not present

## 2018-12-10 DIAGNOSIS — N183 Chronic kidney disease, stage 3 (moderate): Secondary | ICD-10-CM | POA: Diagnosis not present

## 2018-12-10 DIAGNOSIS — E034 Atrophy of thyroid (acquired): Secondary | ICD-10-CM | POA: Diagnosis not present

## 2018-12-11 LAB — CBC WITH DIFFERENTIAL/PLATELET
Basophils Absolute: 0 10*3/uL (ref 0.0–0.2)
Basos: 1 %
EOS (ABSOLUTE): 0.2 10*3/uL (ref 0.0–0.4)
Eos: 3 %
HEMOGLOBIN: 12.6 g/dL (ref 11.1–15.9)
Hematocrit: 37.7 % (ref 34.0–46.6)
Immature Grans (Abs): 0 10*3/uL (ref 0.0–0.1)
Immature Granulocytes: 0 %
Lymphocytes Absolute: 1.4 10*3/uL (ref 0.7–3.1)
Lymphs: 22 %
MCH: 28.1 pg (ref 26.6–33.0)
MCHC: 33.4 g/dL (ref 31.5–35.7)
MCV: 84 fL (ref 79–97)
MONOS ABS: 0.4 10*3/uL (ref 0.1–0.9)
Monocytes: 6 %
Neutrophils Absolute: 4.2 10*3/uL (ref 1.4–7.0)
Neutrophils: 68 %
Platelets: 190 10*3/uL (ref 150–450)
RBC: 4.49 x10E6/uL (ref 3.77–5.28)
RDW: 13.6 % (ref 11.7–15.4)
WBC: 6.2 10*3/uL (ref 3.4–10.8)

## 2018-12-11 LAB — COMPREHENSIVE METABOLIC PANEL
A/G RATIO: 2.3 — AB (ref 1.2–2.2)
ALT: 13 IU/L (ref 0–32)
AST: 15 IU/L (ref 0–40)
Albumin: 4.3 g/dL (ref 3.8–4.8)
Alkaline Phosphatase: 108 IU/L (ref 39–117)
BUN/Creatinine Ratio: 15 (ref 12–28)
BUN: 19 mg/dL (ref 8–27)
Bilirubin Total: 0.4 mg/dL (ref 0.0–1.2)
CO2: 25 mmol/L (ref 20–29)
Calcium: 9.4 mg/dL (ref 8.7–10.3)
Chloride: 102 mmol/L (ref 96–106)
Creatinine, Ser: 1.24 mg/dL — ABNORMAL HIGH (ref 0.57–1.00)
GFR calc Af Amer: 53 mL/min/{1.73_m2} — ABNORMAL LOW (ref >59)
GFR calc non Af Amer: 46 mL/min/{1.73_m2} — ABNORMAL LOW (ref >59)
Globulin, Total: 1.9 g/dL (ref 1.5–4.5)
Glucose: 87 mg/dL (ref 65–99)
Potassium: 4.2 mmol/L (ref 3.5–5.2)
Sodium: 141 mmol/L (ref 134–144)
Total Protein: 6.2 g/dL (ref 6.0–8.5)

## 2018-12-11 LAB — LIPID PANEL
CHOLESTEROL TOTAL: 188 mg/dL (ref 100–199)
Chol/HDL Ratio: 3.1 ratio (ref 0.0–4.4)
HDL: 60 mg/dL (ref >39)
LDL Calculated: 114 mg/dL — ABNORMAL HIGH (ref 0–99)
Triglycerides: 69 mg/dL (ref 0–149)
VLDL Cholesterol Cal: 14 mg/dL (ref 5–40)

## 2018-12-11 LAB — THYROID PANEL WITH TSH
Free Thyroxine Index: 1.7 (ref 1.2–4.9)
T3 Uptake Ratio: 25 % (ref 24–39)
T4 TOTAL: 6.9 ug/dL (ref 4.5–12.0)
TSH: 3.28 u[IU]/mL (ref 0.450–4.500)

## 2018-12-11 LAB — HEMOGLOBIN A1C
Est. average glucose Bld gHb Est-mCnc: 134 mg/dL
Hgb A1c MFr Bld: 6.3 % — ABNORMAL HIGH (ref 4.8–5.6)

## 2018-12-11 LAB — VITAMIN D 25 HYDROXY (VIT D DEFICIENCY, FRACTURES): Vit D, 25-Hydroxy: 33.9 ng/mL (ref 30.0–100.0)

## 2018-12-12 ENCOUNTER — Telehealth: Payer: Self-pay

## 2018-12-12 NOTE — Telephone Encounter (Signed)
Viewed by Donia Guiles on 12/11/2018 5:23 PM Written by Mar Daring, PA-C on 12/11/2018 5:15 PM Blood count is normal. Kidney function is stable and normal. Liver enzymes normal. Cholesterol is normal. Thyroid is normal. Vit D is low normal. Continue OTC Vit D supplement of 1000-2000 IU. A1c did increase to 6.3 from 5.8. Make sure to limit carbs and sugars in diet. Also work on increasing physical activity to work up to 150 min of moderate activity per week.

## 2018-12-12 NOTE — Telephone Encounter (Signed)
-----   Message from Mar Daring, [place] sent at 12/11/2018  5:15 PM EDT ----- Blood count is normal. Kidney function is stable and normal. Liver enzymes normal. Cholesterol is normal. Thyroid is normal. Vit D is low normal. Continue OTC Vit D supplement of 1000-2000 IU. A1c did increase to 6.3 from 5.8. Make sure to limit carbs and sugars in diet. Also work on increasing physical activity to work up to 150 min of moderate activity per week.

## 2018-12-13 DIAGNOSIS — D239 Other benign neoplasm of skin, unspecified: Secondary | ICD-10-CM | POA: Diagnosis not present

## 2018-12-13 DIAGNOSIS — L249 Irritant contact dermatitis, unspecified cause: Secondary | ICD-10-CM | POA: Diagnosis not present

## 2018-12-13 DIAGNOSIS — L570 Actinic keratosis: Secondary | ICD-10-CM | POA: Diagnosis not present

## 2018-12-18 LAB — COLOGUARD: Cologuard: NEGATIVE

## 2018-12-28 ENCOUNTER — Telehealth: Payer: Self-pay | Admitting: Physician Assistant

## 2018-12-28 DIAGNOSIS — Z20822 Contact with and (suspected) exposure to covid-19: Secondary | ICD-10-CM

## 2018-12-28 DIAGNOSIS — Z20828 Contact with and (suspected) exposure to other viral communicable diseases: Secondary | ICD-10-CM

## 2018-12-28 NOTE — Telephone Encounter (Signed)
Covid testing ordered. They stop testing at pm. Not sure about weekends.

## 2018-12-28 NOTE — Telephone Encounter (Signed)
Pt would like to go to [place] to be tested for Covid. Pt stated her husband was tested on 12/27/2018 and was told he was positive today. Pt stated she nor her husband have been having symptoms. Please advise. Thanks TNP

## 2018-12-31 ENCOUNTER — Other Ambulatory Visit: Payer: Self-pay

## 2018-12-31 DIAGNOSIS — Z20822 Contact with and (suspected) exposure to covid-19: Secondary | ICD-10-CM

## 2018-12-31 DIAGNOSIS — R6889 Other general symptoms and signs: Secondary | ICD-10-CM | POA: Diagnosis not present

## 2019-01-02 LAB — NOVEL CORONAVIRUS, NAA: SARS-CoV-2, NAA: NOT DETECTED

## 2019-01-18 ENCOUNTER — Ambulatory Visit
Admission: RE | Admit: 2019-01-18 | Discharge: 2019-01-18 | Disposition: A | Discharge status: Home / Self Care | Payer: PPO | Source: Ambulatory Visit | Attending: Physician Assistant | Admitting: Physician Assistant

## 2019-01-18 DIAGNOSIS — Z1231 Encounter for screening mammogram for malignant neoplasm of breast: Secondary | ICD-10-CM | POA: Insufficient documentation

## 2019-01-30 ENCOUNTER — Telehealth: Payer: Self-pay | Admitting: Physician Assistant

## 2019-01-30 DIAGNOSIS — M1A09X0 Idiopathic chronic gout, multiple sites, without tophus (tophi): Secondary | ICD-10-CM

## 2019-01-30 DIAGNOSIS — E039 Hypothyroidism, unspecified: Secondary | ICD-10-CM

## 2019-01-30 DIAGNOSIS — J301 Allergic rhinitis due to pollen: Secondary | ICD-10-CM

## 2019-01-30 DIAGNOSIS — J4520 Mild intermittent asthma, uncomplicated: Secondary | ICD-10-CM

## 2019-01-30 MED ORDER — LEVOTHYROXINE SODIUM 50 MCG PO TABS: 50.0000 ug | ORAL_TABLET | Freq: Every day | ORAL | 1 refills | Status: DC

## 2019-01-30 MED ORDER — ALLOPURINOL 100 MG PO TABS: 100.0000 mg | ORAL_TABLET | Freq: Every day | ORAL | 1 refills | Status: DC

## 2019-01-30 MED ORDER — MONTELUKAST SODIUM 10 MG PO TABS: 10.0000 mg | ORAL_TABLET | Freq: Every day | ORAL | 1 refills | Status: DC

## 2019-01-30 NOTE — Telephone Encounter (Signed)
[facility] faxed refill request for the following medications:  levothyroxine (SYNTHROID, LEVOTHROID) 50 MCG tablet   montelukast (SINGULAIR) 10 MG tablet   allopurinol (ZYLOPRIM) 100 MG tablet   Please advise.

## 2019-01-30 NOTE — Telephone Encounter (Signed)
Refills sent

## 2019-02-21 ENCOUNTER — Telehealth: Payer: Self-pay

## 2019-02-21 NOTE — Telephone Encounter (Signed)
Please advise 

## 2019-02-21 NOTE — Telephone Encounter (Signed)
Pt stated that she had a sleep study earlier this year and a CPAP was ordered sent to [facility] and because of Covid they were able to process the order because they weren't able to go into pt's homes. Pt stated that she doesn't want to deal with [facility] and is requesting a new order be sent to [facility] for a new CPAP machine and supplies. Pt stated that she is confused as to the difference of a APAP vs CPAP and is requesting a call back to discuss. Please also advise which OV note needs to be sent with the order b/c she is requesting OV note that pertains to her needing a CPAP machine. Please advise. Thanks TNP   Huey Romans [phone]

## 2019-02-22 NOTE — Telephone Encounter (Signed)
faxed

## 2019-02-22 NOTE — Telephone Encounter (Signed)
APAP is an auto pap machine. The pressures will adjust to the patient's needs without having to have repeat titrations for pressures to be changed.   CPAP have designated pressures that have to be set. And if weight changes, etc may need to have retitration CPAP sleep studies to adjust pressures.   Written Rx will be sent to [facility] at patient request. And the note would be from 07/18/18 when it was last addressed.

## 2019-03-13 DIAGNOSIS — G4733 Obstructive sleep apnea (adult) (pediatric): Secondary | ICD-10-CM | POA: Diagnosis not present

## 2019-04-13 DIAGNOSIS — G4733 Obstructive sleep apnea (adult) (pediatric): Secondary | ICD-10-CM | POA: Diagnosis not present

## 2019-05-13 DIAGNOSIS — G4733 Obstructive sleep apnea (adult) (pediatric): Secondary | ICD-10-CM | POA: Diagnosis not present

## 2019-05-17 ENCOUNTER — Encounter: Payer: Self-pay | Admitting: Physician Assistant

## 2019-05-17 DIAGNOSIS — G4733 Obstructive sleep apnea (adult) (pediatric): Secondary | ICD-10-CM | POA: Diagnosis not present

## 2019-06-13 DIAGNOSIS — G4733 Obstructive sleep apnea (adult) (pediatric): Secondary | ICD-10-CM | POA: Diagnosis not present

## 2019-06-27 ENCOUNTER — Encounter: Payer: Self-pay | Admitting: Physician Assistant

## 2019-07-01 ENCOUNTER — Other Ambulatory Visit: Payer: Self-pay | Admitting: Physician Assistant

## 2019-07-01 DIAGNOSIS — N183 Chronic kidney disease, stage 3 unspecified: Secondary | ICD-10-CM

## 2019-07-01 DIAGNOSIS — R600 Localized edema: Secondary | ICD-10-CM

## 2019-07-03 MED ORDER — POTASSIUM CHLORIDE ER 10 MEQ PO TBCR: EXTENDED_RELEASE_TABLET | ORAL | 5 refills | Status: DC

## 2019-07-14 DIAGNOSIS — G4733 Obstructive sleep apnea (adult) (pediatric): Secondary | ICD-10-CM | POA: Diagnosis not present

## 2019-07-15 DIAGNOSIS — M25519 Pain in unspecified shoulder: Secondary | ICD-10-CM | POA: Diagnosis not present

## 2019-07-15 DIAGNOSIS — S8002XA Contusion of left knee, initial encounter: Secondary | ICD-10-CM | POA: Diagnosis not present

## 2019-07-15 DIAGNOSIS — S42252A Displaced fracture of greater tuberosity of left humerus, initial encounter for closed fracture: Secondary | ICD-10-CM | POA: Diagnosis not present

## 2019-07-19 NOTE — Progress Notes (Deleted)
Patient: [name], Female    DOB: July 10, 1955, 64 y.o.   MRN: [id number] Visit Date: 07/19/2019  Today's Provider: Mar Daring, PA-C   No chief complaint on file.  Subjective:     Annual wellness visit [name] is a 64 y.o. female. She feels {DESC; WELL/FAIRLY WELL/POORLY:18703}. She reports exercising ***. She reports she is sleeping {DESC; WELL/FAIRLY WELL/POORLY:18703}.  ----------------------------------------------------------- 01/18/2019 Normal mammogram. Repeat screening in one year. 12/19/2018 Cologuard negative.   Review of Systems  Social History   Socioeconomic History  . Marital status: Married    Spouse name: Not on file  . Number of children: 3  . Years of education: Not on file  . Highest education level: Not on file  Occupational History    Comment: self employeed  Tobacco Use  . Smoking status: Never Smoker  . Smokeless tobacco: Never Used  Substance and Sexual Activity  . Alcohol use: Yes  . Drug use: No  . Sexual activity: Not on file  Other Topics Concern  . Not on file  Social History Narrative  . Not on file   Social Determinants of Health   Financial Resource Strain:   . Difficulty of Paying Living Expenses: Not on file  Food Insecurity:   . Worried About Charity fundraiser in the Last Year: Not on file  . Ran Out of Food in the Last Year: Not on file  Transportation Needs:   . Lack of Transportation (Medical): Not on file  . Lack of Transportation (Non-Medical): Not on file  Physical Activity:   . Days of Exercise per Week: Not on file  . Minutes of Exercise per Session: Not on file  Stress:   . Feeling of Stress : Not on file  Social Connections:   . Frequency of Communication with Friends and Family: Not on file  . Frequency of Social Gatherings with Friends and Family: Not on file  . Attends Religious Services: Not on file  . Active Member of Clubs or Organizations: Not on file  . Attends Theatre manager Meetings: Not on file  . Marital Status: Not on file  Intimate Partner Violence:   . Fear of Current or Ex-Partner: Not on file  . Emotionally Abused: Not on file  . Physically Abused: Not on file  . Sexually Abused: Not on file    Past Medical History:  Diagnosis Date  . Cervical cancer ([place])   . Chronic kidney disease, stage III (moderate) ([facility])   . Foot fracture 2013  . Hiatal hernia   . Hypopotassemia   . OSA on CPAP   . Other abnormal glucose   . Pure hypercholesterolemia   . Reflux esophagitis   . Sleep apnea   . Thyroid fullness   . Thyroid nodule   . Unspecified vitamin D deficiency      Patient Active Problem List   Diagnosis Date Noted  . Abortion, spontaneous 01/28/2015  . Allergic rhinitis 01/28/2015  . Angular blepharoconjunctivitis 01/28/2015  . Asthma 01/28/2015  . History of cervical cancer 01/28/2015  . Chronic kidney disease (CKD), stage III (moderate) 01/28/2015  . CD (contact dermatitis) 01/28/2015  . Dermatitis, eczematoid 01/28/2015  . Edema 01/28/2015  . Esophagitis, reflux 01/28/2015  . Exposure to viral hepatitis 01/28/2015  . Closed fracture of foot 01/28/2015  . Hiatal hernia 01/28/2015  . Decreased potassium in the blood 01/28/2015  . Adult hypothyroidism 01/28/2015  . Nonspecific ST-T changes 01/28/2015  .  Obstructive sleep apnea 01/28/2015  . Borderline diabetes 01/28/2015  . Hypercholesterolemia without hypertriglyceridemia 01/28/2015  . Atrophy of thyroid 01/28/2015  . Thyroid nodule 01/28/2015  . Vitamin D deficiency 01/28/2015  . SHORTNESS OF BREATH 02/17/2010    Past Surgical History:  Procedure Laterality Date  . CHOLECYSTECTOMY    . ELBOW FRACTURE SURGERY Right   . FOOT SURGERY Left 03/2010  . GALLBLADDER SURGERY    . GANGLION CYST EXCISION    . TOTAL VAGINAL HYSTERECTOMY      Her family history includes CAD in her father; Diabetes in her brother; Heart attack (age of onset: 60) in her father; Heart  disease in her father; Hypertension in her brother.   Current Outpatient Medications:  .  albuterol (VENTOLIN HFA) 108 (90 Base) MCG/ACT inhaler, Inhale 2 puffs into the lungs every 6 (six) hours as needed., Disp: 18 g, Rfl: 1 .  allopurinol (ZYLOPRIM) 100 MG tablet, Take 1 tablet (100 mg total) by mouth daily., Disp: 90 tablet, Rfl: 1 .  Azelastine-Fluticasone (DYMISTA) 137-50 MCG/ACT SUSP, Place 2 sprays into the nose daily., Disp: 23 g, Rfl: 1 .  fluticasone (FLONASE) 50 MCG/ACT nasal spray, USE 2 SPRAYS INTO EACH NOSTRIL DAILY, Disp: 48 g, Rfl: 1 .  furosemide (LASIX) 40 MG tablet, Take 1 tablet (40 mg total) by mouth 2 (two) times daily., Disp: 180 tablet, Rfl: 1 .  levothyroxine (SYNTHROID) 50 MCG tablet, Take 1 tablet (50 mcg total) by mouth daily before breakfast., Disp: 90 tablet, Rfl: 1 .  metolazone (ZAROXOLYN) 5 MG tablet, Take 1 tablet (5 mg total) by mouth daily as needed., Disp: 90 tablet, Rfl: 1 .  montelukast (SINGULAIR) 10 MG tablet, Take 1 tablet (10 mg total) by mouth at bedtime., Disp: 90 tablet, Rfl: 1 .  NON FORMULARY, CPAP nightly., Disp: , Rfl:  .  olopatadine (PATANOL) 0.1 % ophthalmic solution, Place 1 drop into both eyes daily., Disp: , Rfl: 5 .  Omega-3 Fatty Acids (FISH OIL) 1200 MG CAPS, Take 2 capsules twice a day., Disp: , Rfl:  .  potassium chloride (KLOR-CON) 10 MEQ tablet, TAKE TWO TABLETS TWICE DAILY, Disp: 60 tablet, Rfl: 5 .  Vitamin D, Ergocalciferol, (DRISDOL) 1.25 MG (50000 UT) CAPS capsule, TAKE 1 CAPSULE ONCE A WEEK, Disp: 4 capsule, Rfl: 2  Patient Care Team: Mar Daring, PA-C as PCP - General (Family Medicine)    Objective:    Vitals: There were no vitals taken for this visit.  Physical Exam  Activities of Daily Living No flowsheet data found.  Fall Risk Assessment Fall Risk  07/18/2018 07/18/2018 08/04/2016  Falls in the past year? 1 1 Yes  Number falls in past yr: 0 0 -  Injury with Fall? 1 0 Yes     Depression Screen PHQ 2/9  Scores 07/18/2018 08/04/2016  PHQ - 2 Score 0 0  PHQ- 9 Score 0 0    No flowsheet data found.    Assessment & Plan:     Annual Wellness Visit  Reviewed patient's Family Medical History Reviewed and updated list of patient's medical providers Assessment of cognitive impairment was done Assessed patient's functional ability Established a written schedule for health screening [place] Completed and Reviewed  Exercise Activities and Dietary recommendations Goals   None     Immunization History  Administered Date(s) Administered  . Influenza Split 04/19/2006, 07/17/2009  . Influenza, High Dose Seasonal PF 10/08/2018  . Influenza,inj,Quad PF,6+ Mos 02/13/2013, 06/24/2015  . Influenza-Unspecified 04/06/2018,  10/08/2018  . Pneumococcal Conjugate-13 06/24/2015  . Pneumococcal Polysaccharide-23 02/13/2013  . Tdap 03/27/2008, 10/08/2018  . Zoster 08/03/2012    Health Maintenance  Topic Date Due  . INFLUENZA VACCINE  01/05/2019  . PNA vac Low Risk Adult (2 of 2 - PPSV23) 07/21/2019 (Originally 06/28/2018)  . MAMMOGRAM  01/17/2021  . Fecal DNA (Cologuard)  12/09/2021  . TETANUS/TDAP  10/07/2028  . DEXA SCAN  Completed  . Hepatitis C Screening  Completed     Discussed health benefits of physical activity, and encouraged her to engage in regular exercise appropriate for her age and condition.    ------------------------------------------------------------------------------------------------------------    Mar Daring, PA-C  [place]

## 2019-07-22 ENCOUNTER — Ambulatory Visit: Payer: Self-pay | Admitting: Physician Assistant

## 2019-07-25 ENCOUNTER — Ambulatory Visit: Payer: Self-pay | Admitting: Physician Assistant

## 2019-07-25 DIAGNOSIS — S42252A Displaced fracture of greater tuberosity of left humerus, initial encounter for closed fracture: Secondary | ICD-10-CM | POA: Diagnosis not present

## 2019-07-29 ENCOUNTER — Ambulatory Visit: Payer: PPO

## 2019-07-29 ENCOUNTER — Ambulatory Visit: Payer: PPO | Attending: Internal Medicine

## 2019-07-29 DIAGNOSIS — Z23 Encounter for immunization: Secondary | ICD-10-CM | POA: Insufficient documentation

## 2019-07-29 NOTE — Progress Notes (Signed)
   Covid-19 Vaccination Clinic  Name:  [name]    MRN: [id number] DOB: 05/25/1956  07/29/2019  [name] was observed post Covid-19 immunization for 15 minutes without incidence. She was provided with Vaccine Information Sheet and instruction to access the V-Safe system.   [name] was instructed to call 911 with any severe reactions post vaccine: Marland Kitchen Difficulty breathing  . Swelling of your face and throat  . A fast heartbeat  . A bad rash all over your body  . Dizziness and weakness    Immunizations Administered    Name Date Dose VIS Date Route   Pfizer COVID-19 Vaccine 07/29/2019  9:30 AM 0.3 mL 05/17/2019 Intramuscular   Manufacturer: [place]   Lot: [id number]   [place]: [id number]

## 2019-08-01 ENCOUNTER — Ambulatory Visit: Payer: Self-pay | Admitting: Physician Assistant

## 2019-08-01 ENCOUNTER — Encounter: Payer: Self-pay | Admitting: Physician Assistant

## 2019-08-08 ENCOUNTER — Other Ambulatory Visit: Payer: Self-pay | Admitting: Physician Assistant

## 2019-08-08 DIAGNOSIS — E039 Hypothyroidism, unspecified: Secondary | ICD-10-CM

## 2019-08-08 DIAGNOSIS — J301 Allergic rhinitis due to pollen: Secondary | ICD-10-CM

## 2019-08-08 DIAGNOSIS — S42252A Displaced fracture of greater tuberosity of left humerus, initial encounter for closed fracture: Secondary | ICD-10-CM | POA: Diagnosis not present

## 2019-08-08 DIAGNOSIS — M1A09X0 Idiopathic chronic gout, multiple sites, without tophus (tophi): Secondary | ICD-10-CM

## 2019-08-08 DIAGNOSIS — J4520 Mild intermittent asthma, uncomplicated: Secondary | ICD-10-CM

## 2019-08-08 NOTE — Telephone Encounter (Signed)
Called pt to schedule an office for her refills. She stated she had broken her arm and trying to wait to see if her pcp would be doing a breast examine at that time. She is not able to lift her arm up. So she would like a call back in regards to scheduling an appointment and getting her meds refilled. She stated she has about 2 weeks left until needed. Pt needs an office visit before refilling. Routing to [employer].

## 2019-08-11 DIAGNOSIS — G4733 Obstructive sleep apnea (adult) (pediatric): Secondary | ICD-10-CM | POA: Diagnosis not present

## 2019-08-20 ENCOUNTER — Ambulatory Visit: Payer: PPO | Attending: Internal Medicine

## 2019-08-20 DIAGNOSIS — Z23 Encounter for immunization: Secondary | ICD-10-CM

## 2019-08-20 NOTE — Progress Notes (Signed)
   Covid-19 Vaccination Clinic  Name:  [name]    MRN: [id number] DOB: 1955/09/17  08/20/2019  Ms. [name] was observed post Covid-19 immunization for 15 minutes without incident. She was provided with Vaccine Information Sheet and instruction to access the V-Safe system.   Ms. [name] was instructed to call 911 with any severe reactions post vaccine: Marland Kitchen Difficulty breathing  . Swelling of face and throat  . A fast heartbeat  . A bad rash all over body  . Dizziness and weakness   Immunizations Administered    Name Date Dose VIS Date Route   Pfizer COVID-19 Vaccine 08/20/2019  9:43 AM 0.3 mL 05/17/2019 Intramuscular   Manufacturer: [place]   Lot: [id number]   [place]: [id number]

## 2019-08-29 DIAGNOSIS — S42252A Displaced fracture of greater tuberosity of left humerus, initial encounter for closed fracture: Secondary | ICD-10-CM | POA: Diagnosis not present

## 2019-09-10 ENCOUNTER — Other Ambulatory Visit: Payer: Self-pay | Admitting: Physician Assistant

## 2019-09-10 ENCOUNTER — Telehealth: Payer: Self-pay | Admitting: *Deleted

## 2019-09-10 DIAGNOSIS — J301 Allergic rhinitis due to pollen: Secondary | ICD-10-CM

## 2019-09-10 DIAGNOSIS — E559 Vitamin D deficiency, unspecified: Secondary | ICD-10-CM

## 2019-09-10 DIAGNOSIS — M25512 Pain in left shoulder: Secondary | ICD-10-CM | POA: Diagnosis not present

## 2019-09-10 DIAGNOSIS — M25612 Stiffness of left shoulder, not elsewhere classified: Secondary | ICD-10-CM | POA: Diagnosis not present

## 2019-09-10 NOTE — Telephone Encounter (Signed)
Patient would like to know if she can have refill to make it to upcoming appointment on 04/26

## 2019-09-10 NOTE — Telephone Encounter (Signed)
Requested medication (s) are due for refill today: yes  Requested medication (s) are on the active medication list: yes  Last refill:  10/23/2018  Future visit scheduled: no  Notes to clinic: not delegated; no valid encounter within last 12 months    Requested Prescriptions  Pending Prescriptions Disp Refills   Vitamin D, Ergocalciferol, (DRISDOL) 1.25 MG (50000 UNIT) CAPS capsule [Pharmacy Med Name: VITAMIN D (ERGOCALCIFEROL) 1.25 MG] 4 capsule 2    Sig: TAKE ONE CAPSULE BY MOUTH ONCE A WEEK      Endocrinology:  Vitamins - Vitamin D Supplementation Failed - 09/10/2019 12:03 PM      Failed - 50,000 IU strengths are not delegated      Failed - Phosphate in normal range and within 360 days    No results found for: PHOS        Failed - Valid encounter within last 12 months    Recent Outpatient Visits           1 year ago [facility] [employer] preventive visit   [facility], [place], [place]   2 years ago Sprain of anterior talofibular ligament of right ankle, initial encounter   [place], [place] [employer], [place]   2 years ago Acute gout involving toe of right foot, unspecified cause   [facility] [place], [place], [place]   3 years ago Annual physical exam   [facility] [place], [place]   3 years ago Shoulder pain, acute, right   [place], [place]              Passed - Ca in normal range and within 360 days    Calcium  Date Value Ref Range Status  12/10/2018 9.4 8.7 - 10.3 mg/dL Final          Passed - Vitamin D in normal range and within 360 days    Vit D, 25-Hydroxy  Date Value Ref Range Status  12/10/2018 33.9 30.0 - 100.0 ng/mL Final    Comment:    Vitamin D deficiency has been defined by the [place] practice guideline as a level of serum 25-OH vitamin D less than 20 ng/mL (1,2). The Endocrine Society  went on to further define vitamin D insufficiency as a level between 21 and 29 ng/mL (2). 1. IOM (Institute of Medicine). 2010. Dietary reference    intakes for calcium and D. [place]: The    [employer]. 2. Holick MF, [place], Bischoff-Ferrari HA, et al.    Evaluation, treatment, and prevention of vitamin D    deficiency: an Endocrine Society clinical practice    guideline. JCEM. 2011 Jul; 96(7):1911-30.

## 2019-09-10 NOTE — Telephone Encounter (Signed)
Refill request for fluticasone; no valid encounter within last 12 months; last visit 07/18/2018; no upcoming visits noted; attempted to contact pt; left message on voicemail; courtesy refill granted; will route to office for notification.

## 2019-09-10 NOTE — Telephone Encounter (Signed)
Requested Prescriptions  Pending Prescriptions Disp Refills  . fluticasone (FLONASE) 50 MCG/ACT nasal spray [Pharmacy Med Name: FLUTICASONE PROPIONATE 50 MCG/ACT N] 16 g 0    Sig: USE 2 SPRAYS INTO EACH NOSTRIL DAILY     Ear, Nose, and Throat: Nasal Preparations - Corticosteroids Failed - 09/10/2019 12:01 PM      Failed - Valid encounter within last 12 months    Recent Outpatient Visits          1 year ago [facility] [employer] preventive visit   [place], [place]   2 years ago Sprain of anterior talofibular ligament of right ankle, initial encounter   [place], [place], [place]   2 years ago Acute gout involving toe of right foot, unspecified cause   [facility] [place], [place], [place]   3 years ago Annual physical exam   [facility] [place], [place]   3 years ago Shoulder pain, acute, right   [facility], [place], [place]

## 2019-09-11 DIAGNOSIS — G4733 Obstructive sleep apnea (adult) (pediatric): Secondary | ICD-10-CM | POA: Diagnosis not present

## 2019-09-12 NOTE — Progress Notes (Signed)
Subjective:   [name] is a 64 y.o. female who presents for an Initial Medicare Annual Wellness Visit.    This visit is being conducted through telemedicine due to the COVID-19 pandemic. This patient has given me verbal consent via doximity to conduct this visit, patient states they are participating from their home address. Some vital signs may be absent or patient reported.    Patient identification: identified by name, DOB, and current address  Review of Systems    N/A  Cardiac Risk Factors include: advanced age (>56men, >36 women);obesity (BMI >30kg/m2)     Objective:    Today's Vitals   09/16/19 0932  PainSc: 3    There is no height or weight on file to calculate BMI. Unable to obtain vitals due to visit being conducted via telephonically.   Advanced Directives 09/16/2019  Does Patient Have a Medical Advance Directive? No  Would patient like information on creating a medical advance directive? No - Patient declined    Current Medications (verified) Outpatient Encounter Medications as of 09/16/2019  Medication Sig  . albuterol (VENTOLIN HFA) 108 (90 Base) MCG/ACT inhaler Inhale 2 puffs into the lungs every 6 (six) hours as needed.  Marland Kitchen allopurinol (ZYLOPRIM) 100 MG tablet TAKE 1 TABLET BY MOUTH DAILY  . clobetasol (TEMOVATE) 0.05 % external solution Apply 1 application topically as needed. Due to CPAP use  . fluticasone (FLONASE) 50 MCG/ACT nasal spray USE 2 SPRAYS INTO EACH NOSTRIL DAILY  . furosemide (LASIX) 40 MG tablet Take 1 tablet (40 mg total) by mouth 2 (two) times daily.  Marland Kitchen levothyroxine (SYNTHROID) 50 MCG tablet TAKE ONE TABLET EACH MORNING BEFORE BREAKFAST  . metolazone (ZAROXOLYN) 5 MG tablet Take 1 tablet (5 mg total) by mouth daily as needed.  . montelukast (SINGULAIR) 10 MG tablet TAKE 1 TABLET BY MOUTH AT BEDTIME  . NON FORMULARY CPAP nightly.  Marland Kitchen olopatadine (PATANOL) 0.1 % ophthalmic solution Place 1 drop into both eyes daily. As needed  . potassium  chloride (KLOR-CON) 10 MEQ tablet TAKE TWO TABLETS TWICE DAILY  . Vitamin D, Ergocalciferol, (DRISDOL) 1.25 MG (50000 UNIT) CAPS capsule TAKE ONE CAPSULE BY MOUTH ONCE A WEEK  . Azelastine-Fluticasone (DYMISTA) 137-50 MCG/ACT SUSP Place 2 sprays into the nose daily. (Patient not taking: Reported on 09/16/2019)  . Omega-3 Fatty Acids (FISH OIL) 1200 MG CAPS Take 2 capsules twice a day.  . [DISCONTINUED] Vitamin D, Ergocalciferol, (DRISDOL) 1.25 MG (50000 UT) CAPS capsule TAKE 1 CAPSULE ONCE A WEEK   No facility-administered encounter medications on file as of 09/16/2019.    Allergies (verified) Penicillins   History: Past Medical History:  Diagnosis Date  . Broken arm   . Cervical cancer ([place])   . Chronic kidney disease, stage III (moderate)   . Foot fracture 2013  . Hiatal hernia   . Hypopotassemia   . OSA on CPAP   . Other abnormal glucose   . Pure hypercholesterolemia   . Reflux esophagitis   . Sleep apnea   . Thyroid fullness   . Thyroid nodule   . Unspecified vitamin D deficiency    Past Surgical History:  Procedure Laterality Date  . CHOLECYSTECTOMY    . ELBOW FRACTURE SURGERY Right   . FOOT SURGERY Left 03/2010  . GALLBLADDER SURGERY    . GANGLION CYST EXCISION    . TOTAL VAGINAL HYSTERECTOMY     Family History  Problem Relation Age of Onset  . Heart attack Father 49  MI  . Heart disease Father   . CAD Father   . Hypertension Brother   . Diabetes Brother    Social History   Socioeconomic History  . Marital status: Married    Spouse name: Not on file  . Number of children: 3  . Years of education: Not on file  . Highest education level: Some college, no degree  Occupational History    Comment: self employeed  Tobacco Use  . Smoking status: Never Smoker  . Smokeless tobacco: Never Used  Substance and Sexual Activity  . Alcohol use: Yes    Alcohol/week: 0.0 - 1.0 standard drinks  . Drug use: No  . Sexual activity: Not on file  Other Topics  Concern  . Not on file  Social History Narrative  . Not on file   Social Determinants of Health   Financial Resource Strain: Low Risk   . Difficulty of Paying Living Expenses: Not hard at all  Food Insecurity: No Food Insecurity  . Worried About Charity fundraiser in the Last Year: Never true  . Ran Out of Food in the Last Year: Never true  Transportation Needs: No Transportation Needs  . Lack of Transportation (Medical): No  . Lack of Transportation (Non-Medical): No  Physical Activity: Insufficiently Active  . Days of Exercise per Week: 2 days  . Minutes of Exercise per Session: 60 min  Stress: No Stress Concern Present  . Feeling of Stress : Not at all  Social Connections: Not Isolated  . Frequency of Communication with Friends and Family: More than three times a week  . Frequency of Social Gatherings with Friends and Family: More than three times a week  . Attends Religious Services: More than 4 times per year  . Active Member of Clubs or Organizations: Yes  . Attends Archivist Meetings: 1 to 4 times per year  . Marital Status: Married    Tobacco Counseling Counseling given: Not Answered   Clinical Intake:  Pre-visit preparation completed: Yes  Pain : 0-10 Pain Score: 3  Pain Type: Acute pain Pain Location: Arm(broke arm) Pain Orientation: Left Pain Descriptors / Indicators: Aching, Dull Pain Frequency: Intermittent Pain Relieving Factors: Currently in PT for recorvery.  Pain Relieving Factors: Currently in PT for recorvery.  Nutritional Risks: None Diabetes: No  How often do you need to have someone help you when you read instructions, pamphlets, or other written materials from your doctor or pharmacy?: 1 - Never  Interpreter Needed?: No  Information entered by :: [facility], LPN   Activities of Daily Living In your present state of health, do you have any difficulty performing the following activities: 09/16/2019  Hearing? N  Vision? N    Difficulty concentrating or making decisions? N  Walking or climbing stairs? N  Dressing or bathing? N  Doing errands, shopping? N  Preparing Food and eating ? N  Using the Toilet? N  In the past six months, have you accidently leaked urine? Y  Comment Occasionally with urges.  Do you have problems with loss of bowel control? N  Managing your Medications? N  Managing your Finances? N  Housekeeping or managing your Housekeeping? N  Some recent data might be hidden     Immunizations and Health Maintenance Immunization History  Administered Date(s) Administered  . Influenza Split 04/19/2006, 07/17/2009  . Influenza, High Dose Seasonal PF 10/08/2018  . Influenza,inj,Quad PF,6+ Mos 02/13/2013, 06/24/2015  . Influenza-Unspecified 04/06/2018, 10/08/2018  . PFIZER SARS-COV-2 Vaccination 07/29/2019,  08/20/2019  . Pneumococcal Conjugate-13 06/24/2015  . Pneumococcal Polysaccharide-23 02/13/2013  . Tdap 03/27/2008, 10/08/2018  . Zoster 08/03/2012   Health Maintenance Due  Topic Date Due  . PNA vac Low Risk Adult (2 of 2 - PPSV23) 06/28/2018    Patient Care Team: Mar Daring, PA-C as PCP - General (Family Medicine) Murlean Iba, MD (Nephrology) Earnestine Leys, MD (Orthopedic Surgery) Carloyn Manner, MD as Referring Physician (Otolaryngology) Jannet Mantis, MD (Dermatology)  Indicate any recent Medical Services you may have received from other than [facility] providers in the past year (date may be approximate).     Assessment:   This is a routine wellness examination for [name].  Hearing/Vision screen No exam data present  Dietary issues and exercise activities discussed: Current Exercise Habits: Home exercise routine, Type of exercise: walking, Time (Minutes): 60, Frequency (Times/Week): 2(to 3 days), Weekly Exercise (Minutes/Week): 120, Intensity: Mild, Exercise limited by: None identified  Goals    . Reduce caffeine intake     Recommend to continue to  cut back on sweet tea intake and increase water intake .       Depression Screen PHQ 2/9 Scores 09/16/2019 07/18/2018 08/04/2016  PHQ - 2 Score 0 0 0  PHQ- 9 Score - 0 0    Fall Risk Fall Risk  09/16/2019 07/18/2018 07/18/2018 08/04/2016  Falls in the past year? 1 1 1  Yes  Number falls in past yr: 0 0 0 -  Injury with Fall? 1 1 0 Yes  Follow up Falls prevention discussed - - -   FALL RISK PREVENTION PERTAINING TO THE HOME:  Any stairs in or around the home? Yes  If so, are there any without handrails? No   Home free of loose throw rugs in walkways, pet beds, electrical cords, etc? Yes  Adequate lighting in your home to reduce risk of falls? Yes   ASSISTIVE DEVICES UTILIZED TO PREVENT FALLS:  Life alert? No  Use of a cane, walker or w/c? No  Grab bars in the bathroom? Yes  Shower chair or bench in shower? No  Elevated toilet seat or a handicapped toilet? Yes    TIMED UP AND GO:  Was the test performed? No .     Cognitive Function: Declined today.         Screening Tests Health Maintenance  Topic Date Due  . PNA vac Low Risk Adult (2 of 2 - PPSV23) 06/28/2018  . INFLUENZA VACCINE  01/05/2020  . MAMMOGRAM  01/17/2021  . Fecal DNA (Cologuard)  12/09/2021  . TETANUS/TDAP  10/07/2028  . DEXA SCAN  Completed  . Hepatitis C Screening  Completed    Qualifies for Shingles Vaccine? Yes  Zostavax completed 08/03/12. Due for Shingrix. Pt has been advised to call insurance company to determine out of pocket expense. Advised may also receive vaccine at local pharmacy or Health Dept. Verbalized acceptance and understanding.  Tdap: Up to date  Flu Vaccine: Due for Flu vaccine in fall 2021.  Pneumococcal Vaccine: Due for Pneumococcal vaccine. Does the patient want to receive this vaccine today?  No . Advised may receive this vaccine at local pharmacy or Health Dept. Aware to provide a copy of the vaccination record if obtained from local pharmacy or Health Dept. Verbalized acceptance  and understanding.   Cancer Screenings:  Colorectal Screening: Cologuard completed 12/10/18. Repeat every 3 years.   Mammogram: Completed 01/18/19. Repeat every 1-2 years as advised.   Bone Density: Completed 07/24/12. Previous DEXA scan was  normal. No repeat needed unless advised by a physician.  Lung Cancer Screening: (Low Dose CT Chest recommended if Age 90-80 years, 30 pack-year currently smoking OR have quit w/in 15years.) does not qualify.   Additional Screening:  Hepatitis C Screening: Up to date  Vision Screening: Recommended annual ophthalmology exams for early detection of glaucoma and other disorders of the eye.  Dental Screening: Recommended annual dental exams for proper oral hygiene  Community Resource Referral:  CRR required this visit?  No       Plan:  I have personally reviewed and addressed the Medicare Annual Wellness questionnaire and have noted the following in the patient's chart:  A. Medical and social history B. Use of alcohol, tobacco or illicit drugs  C. Current medications and supplements D. Functional ability and status E.  Nutritional status F.  Physical activity G. Advance directives H. List of other physicians I.  Hospitalizations, surgeries, and ER visits in previous 12 months J.  [place] such as hearing and vision if needed, cognitive and depression L. Referrals and appointments   In addition, I have reviewed and discussed with patient certain preventive protocols, quality metrics, and best practice recommendations. A written personalized care plan for preventive services as well as general preventive health recommendations were provided to patient.   [place]ora Score, [place]   [id number]  Nurse Health Advisor   Nurse Notes: Pt needs a pneumonia shot at next in office apt.

## 2019-09-16 ENCOUNTER — Ambulatory Visit (INDEPENDENT_AMBULATORY_CARE_PROVIDER_SITE_OTHER): Payer: PPO

## 2019-09-16 ENCOUNTER — Other Ambulatory Visit: Payer: Self-pay

## 2019-09-16 DIAGNOSIS — Z0000 Encounter for general adult medical examination without abnormal findings: Secondary | ICD-10-CM

## 2019-09-16 NOTE — Patient Instructions (Signed)
[name] , Thank you for taking time to come for your Medicare Wellness Visit. I appreciate your ongoing commitment to your health goals. Please review the following plan we discussed and let me know if I can assist you in the future.   Screening recommendations/referrals: Colonoscopy: Cologuard up to date, due 12/09/21 Mammogram: Up to date, due 01/2021 Bone Density: Up to date. Previous DEXA scan was normal. No repeat needed unless advised by a physician. Recommended yearly ophthalmology/optometry visit for glaucoma screening and checkup Recommended yearly dental visit for hygiene and checkup  Vaccinations: Influenza vaccine: Due fall 2021 Pneumococcal vaccine: Currently due Tdap vaccine: Up to date Shingles vaccine: Pt declines today.     Advanced directives: Advance directive discussed with you today. Even though you declined this today please call our office should you change your mind and we can give you the proper paperwork for you to fill out.  Conditions/risks identified: Recommend to continue to cut back on sweet tea intake and increase water intake .   Next appointment: 09/30/19 @ 10:00 AM with Fenton Malling. Declined scheduling an AWV for 2022 at this time.    Preventive Care 32 Years and Older, Female Preventive care refers to lifestyle choices and visits with your health care provider that can promote health and wellness. What does preventive care include?  A yearly physical exam. This is also called an annual well check.  Dental exams once or twice a year.  Routine eye exams. Ask your health care provider how often you should have your eyes checked.  Personal lifestyle choices, including:  Daily care of your teeth and gums.  Regular physical activity.  Eating a healthy diet.  Avoiding tobacco and drug use.  Limiting alcohol use.  Practicing safe sex.  Taking low-dose aspirin every day.  Taking vitamin and mineral supplements as recommended by your  health care provider. What happens during an annual well check? The services and screenings done by your health care provider during your annual well check will depend on your age, overall health, lifestyle risk factors, and family history of disease. Counseling  Your health care provider may ask you questions about your:  Alcohol use.  Tobacco use.  Drug use.  Emotional well-being.  Home and relationship well-being.  Sexual activity.  Eating habits.  History of falls.  Memory and ability to understand (cognition).  Work and work Statistician.  Reproductive health. Screening  You may have the following tests or measurements:  Height, weight, and BMI.  Blood pressure.  Lipid and cholesterol levels. These may be checked every 5 years, or more frequently if you are over 45 years old.  Skin check.  Lung cancer screening. You may have this screening every year starting at age 55 if you have a 30-pack-year history of smoking and currently smoke or have quit within the past 15 years.  Fecal occult blood test (FOBT) of the stool. You may have this test every year starting at age 15.  Flexible sigmoidoscopy or colonoscopy. You may have a sigmoidoscopy every 5 years or a colonoscopy every 10 years starting at age 49.  Hepatitis C blood test.  Hepatitis B blood test.  Sexually transmitted disease (STD) testing.  Diabetes screening. This is done by checking your blood sugar (glucose) after you have not eaten for a while (fasting). You may have this done every 1-3 years.  Bone density scan. This is done to screen for osteoporosis. You may have this done starting at age 64.  Mammogram.  This may be done every 1-2 years. Talk to your health care provider about how often you should have regular mammograms. Talk with your health care provider about your test results, treatment options, and if necessary, the need for more tests. Vaccines  Your health care provider may recommend  certain vaccines, such as:  Influenza vaccine. This is recommended every year.  Tetanus, diphtheria, and acellular pertussis (Tdap, Td) vaccine. You may need a Td booster every 10 years.  Zoster vaccine. You may need this after age 38.  Pneumococcal 13-valent conjugate (PCV13) vaccine. One dose is recommended after age 12.  Pneumococcal polysaccharide (PPSV23) vaccine. One dose is recommended after age 72. Talk to your health care provider about which screenings and vaccines you need and how often you need them. This information is not intended to replace advice given to you by your health care provider. Make sure you discuss any questions you have with your health care provider. Document Released: 06/19/2015 Document Revised: 02/10/2016 Document Reviewed: 03/24/2015 Elsevier Interactive Patient Education  2017 [place] Prevention in the Home Falls can cause injuries. They can happen to people of all ages. There are many things you can do to make your home safe and to help prevent falls. What can I do on the outside of my home?  Regularly fix the edges of walkways and driveways and fix any cracks.  Remove anything that might make you trip as you walk through a door, such as a raised step or threshold.  Trim any bushes or trees on the path to your home.  Use bright outdoor lighting.  Clear any walking paths of anything that might make someone trip, such as rocks or tools.  Regularly check to see if handrails are loose or broken. Make sure that both sides of any steps have handrails.  Any raised decks and porches should have guardrails on the edges.  Have any leaves, snow, or ice cleared regularly.  Use sand or salt on walking paths during winter.  Clean up any spills in your garage right away. This includes oil or grease spills. What can I do in the bathroom?  Use night lights.  Install grab bars by the toilet and in the tub and shower. Do not use towel bars as  grab bars.  Use non-skid mats or decals in the tub or shower.  If you need to sit down in the shower, use a plastic, non-slip stool.  Keep the floor dry. Clean up any water that spills on the floor as soon as it happens.  Remove soap buildup in the tub or shower regularly.  Attach bath mats securely with double-sided non-slip rug tape.  Do not have throw rugs and other things on the floor that can make you trip. What can I do in the bedroom?  Use night lights.  Make sure that you have a light by your bed that is easy to reach.  Do not use any sheets or blankets that are too big for your bed. They should not hang down onto the floor.  Have a firm chair that has side arms. You can use this for support while you get dressed.  Do not have throw rugs and other things on the floor that can make you trip. What can I do in the kitchen?  Clean up any spills right away.  Avoid walking on wet floors.  Keep items that you use a lot in easy-to-reach places.  If you need to reach something  above you, use a strong step stool that has a grab bar.  Keep electrical cords out of the way.  Do not use floor polish or wax that makes floors slippery. If you must use wax, use non-skid floor wax.  Do not have throw rugs and other things on the floor that can make you trip. What can I do with my stairs?  Do not leave any items on the stairs.  Make sure that there are handrails on both sides of the stairs and use them. Fix handrails that are broken or loose. Make sure that handrails are as long as the stairways.  Check any carpeting to make sure that it is firmly attached to the stairs. Fix any carpet that is loose or worn.  Avoid having throw rugs at the top or bottom of the stairs. If you do have throw rugs, attach them to the floor with carpet tape.  Make sure that you have a light switch at the top of the stairs and the bottom of the stairs. If you do not have them, ask someone to add them  for you. What else can I do to help prevent falls?  Wear shoes that:  Do not have high heels.  Have rubber bottoms.  Are comfortable and fit you well.  Are closed at the toe. Do not wear sandals.  If you use a stepladder:  Make sure that it is fully opened. Do not climb a closed stepladder.  Make sure that both sides of the stepladder are locked into place.  Ask someone to hold it for you, if possible.  Clearly mark and make sure that you can see:  Any grab bars or handrails.  First and last steps.  Where the edge of each step is.  Use tools that help you move around (mobility aids) if they are needed. These include:  Canes.  Walkers.  Scooters.  Crutches.  Turn on the lights when you go into a dark area. Replace any light bulbs as soon as they burn out.  Set up your furniture so you have a clear path. Avoid moving your furniture around.  If any of your floors are uneven, fix them.  If there are any pets around you, be aware of where they are.  Review your medicines with your doctor. Some medicines can make you feel dizzy. This can increase your chance of falling. Ask your doctor what other things that you can do to help prevent falls. This information is not intended to replace advice given to you by your health care provider. Make sure you discuss any questions you have with your health care provider. Document Released: 03/19/2009 Document Revised: 10/29/2015 Document Reviewed: 06/27/2014 Elsevier Interactive Patient Education  2017 [employer]   

## 2019-09-18 DIAGNOSIS — M25612 Stiffness of left shoulder, not elsewhere classified: Secondary | ICD-10-CM | POA: Diagnosis not present

## 2019-09-18 DIAGNOSIS — M25512 Pain in left shoulder: Secondary | ICD-10-CM | POA: Diagnosis not present

## 2019-09-24 NOTE — Progress Notes (Signed)
Complete physical exam    Patient: [name]   DOB: 1955-11-04   64 y.o. Female  MRN: [id number] Visit Date: 09/30/2019  Today's healthcare provider: Mar Daring, PA-C  Subjective:    Chief Complaint  Patient presents with  . Annual Exam    [name] is a 64 y.o. female who presents today for a complete physical exam.  She reports consuming a general diet. Home exercise routine includes walking 1 hrs per 2-3 week. She generally feels well. She reports sleeping fairly well. She does not have additional problems to discuss today.  HPI  Had AWV on 09/25/2019  Past Medical History:  Diagnosis Date  . Broken arm   . Cervical cancer ([place])   . Chronic kidney disease, stage III (moderate)   . Foot fracture 2013  . Hiatal hernia   . Hypopotassemia   . OSA on CPAP   . Other abnormal glucose   . Pure hypercholesterolemia   . Reflux esophagitis   . Sleep apnea   . Thyroid fullness   . Thyroid nodule   . Unspecified vitamin D deficiency    Past Surgical History:  Procedure Laterality Date  . CHOLECYSTECTOMY    . ELBOW FRACTURE SURGERY Right   . FOOT SURGERY Left 03/2010  . GALLBLADDER SURGERY    . GANGLION CYST EXCISION    . TOTAL VAGINAL HYSTERECTOMY     Social History   Socioeconomic History  . Marital status: Married    Spouse name: Not on file  . Number of children: 3  . Years of education: Not on file  . Highest education level: Some college, no degree  Occupational History    Comment: self employeed  Tobacco Use  . Smoking status: Never Smoker  . Smokeless tobacco: Never Used  Substance and Sexual Activity  . Alcohol use: Yes    Alcohol/week: 0.0 - 1.0 standard drinks  . Drug use: No  . Sexual activity: Not on file  Other Topics Concern  . Not on file  Social History Narrative  . Not on file   Social Determinants of Health   Financial Resource Strain: Low Risk   . Difficulty of Paying Living Expenses: Not hard at all  Food  Insecurity: No Food Insecurity  . Worried About Charity fundraiser in the Last Year: Never true  . Ran Out of Food in the Last Year: Never true  Transportation Needs: No Transportation Needs  . Lack of Transportation (Medical): No  . Lack of Transportation (Non-Medical): No  Physical Activity: Insufficiently Active  . Days of Exercise per Week: 2 days  . Minutes of Exercise per Session: 60 min  Stress: No Stress Concern Present  . Feeling of Stress : Not at all  Social Connections: Not Isolated  . Frequency of Communication with Friends and Family: More than three times a week  . Frequency of Social Gatherings with Friends and Family: More than three times a week  . Attends Religious Services: More than 4 times per year  . Active Member of Clubs or Organizations: Yes  . Attends Archivist Meetings: 1 to 4 times per year  . Marital Status: Married  Human resources officer Violence: Not At Risk  . Fear of Current or Ex-Partner: No  . Emotionally Abused: No  . Physically Abused: No  . Sexually Abused: No   Family Status  Relation Name Status  . Father  Deceased at age 48  . Brother  Alive       diabetes mellitus Type II  . Mother  Deceased       kidney trouble  . MGM  Deceased       breast cancer  . Brother  Alive       epilepsy  . Daughter  Alive  . Daughter  Alive  . Daughter  Alive   Family History  Problem Relation Age of Onset  . Heart attack Father 86       MI  . Heart disease Father   . CAD Father   . Hypertension Brother   . Diabetes Brother    Allergies  Allergen Reactions  . Penicillins     Swelling  Hives     Patient Care Team: Mar Daring, PA-C as PCP - General (Family Medicine) Murlean Iba, MD (Nephrology) Earnestine Leys, MD (Orthopedic Surgery) Carloyn Manner, MD as Referring Physician (Otolaryngology) Jannet Mantis, MD (Dermatology)   Medications: Outpatient Medications Prior to Visit  Medication Sig  . albuterol  (VENTOLIN HFA) 108 (90 Base) MCG/ACT inhaler Inhale 2 puffs into the lungs every 6 (six) hours as needed.  Marland Kitchen allopurinol (ZYLOPRIM) 100 MG tablet TAKE 1 TABLET BY MOUTH DAILY  . clobetasol (TEMOVATE) 0.05 % external solution Apply 1 application topically as needed. Due to CPAP use  . fluticasone (FLONASE) 50 MCG/ACT nasal spray USE 2 SPRAYS INTO EACH NOSTRIL DAILY  . furosemide (LASIX) 40 MG tablet Take 1 tablet (40 mg total) by mouth 2 (two) times daily.  Marland Kitchen levothyroxine (SYNTHROID) 50 MCG tablet TAKE ONE TABLET EACH MORNING BEFORE BREAKFAST  . metolazone (ZAROXOLYN) 5 MG tablet Take 1 tablet (5 mg total) by mouth daily as needed.  . montelukast (SINGULAIR) 10 MG tablet TAKE 1 TABLET BY MOUTH AT BEDTIME  . NON FORMULARY CPAP nightly.  Marland Kitchen olopatadine (PATANOL) 0.1 % ophthalmic solution Place 1 drop into both eyes daily. As needed  . Omega-3 Fatty Acids (FISH OIL) 1200 MG CAPS Take 2 capsules twice a day.  . potassium chloride (KLOR-CON) 10 MEQ tablet TAKE TWO TABLETS TWICE DAILY  . Vitamin D, Ergocalciferol, (DRISDOL) 1.25 MG (50000 UNIT) CAPS capsule TAKE ONE CAPSULE BY MOUTH ONCE A WEEK  . Azelastine-Fluticasone (DYMISTA) 137-50 MCG/ACT SUSP Place 2 sprays into the nose daily. (Patient not taking: Reported on 09/16/2019)   No facility-administered medications prior to visit.    Review of Systems  Constitutional: Negative.   HENT: Negative.   Eyes: Negative.   Respiratory: Negative.   Cardiovascular: Negative.   Gastrointestinal: Negative.   Endocrine: Negative.   Genitourinary: Negative.   Musculoskeletal: Negative.   Skin: Negative.   Allergic/Immunologic: Negative.   Neurological: Negative.   Hematological: Negative.   Psychiatric/Behavioral: Negative.          Objective:    BP (!) 142/75 (BP Location: Right Arm, Patient Position: Sitting, Cuff Size: Large)   Pulse 65   Temp (!) 96.9 F (36.1 C) (Temporal)   Resp 16   Ht 5\' 8"  (1.727 m)   Wt 240 lb (108.9 kg)   BMI  36.49 kg/m     Physical Exam Vitals reviewed.  Constitutional:      General: She is not in acute distress.    Appearance: Normal appearance. She is well-developed. She is obese. She is not ill-appearing or diaphoretic.  HENT:     Head: Normocephalic and atraumatic.     Right Ear: Tympanic membrane, ear canal and external ear normal. There is no impacted cerumen.  Left Ear: Tympanic membrane, ear canal and external ear normal. There is no impacted cerumen.  Eyes:     General: No scleral icterus.       Right eye: No discharge.        Left eye: No discharge.     Extraocular Movements: Extraocular movements intact.     Conjunctiva/sclera: Conjunctivae normal.     Pupils: Pupils are equal, round, and reactive to light.  Neck:     Thyroid: No thyromegaly.     Vascular: No carotid bruit or JVD.     Trachea: No tracheal deviation.  Cardiovascular:     Rate and Rhythm: Normal rate and regular rhythm.     Pulses: Normal pulses.     Heart sounds: Normal heart sounds. No murmur. No friction rub. No gallop.   Pulmonary:     Effort: Pulmonary effort is normal. No respiratory distress.     Breath sounds: Normal breath sounds. No wheezing or rales.  Chest:     Chest wall: No tenderness.  Abdominal:     General: Abdomen is protuberant. Bowel sounds are normal. There is no distension.     Palpations: Abdomen is soft. There is no mass.     Tenderness: There is no abdominal tenderness. There is no guarding or rebound.  Musculoskeletal:        General: No tenderness.     Left shoulder: Decreased range of motion (decreased ROM from previous humeral fracture done in February 2021).     Cervical back: Normal range of motion and neck supple.     Right lower leg: No edema.     Left lower leg: No edema.  Lymphadenopathy:     Cervical: No cervical adenopathy.  Skin:    General: Skin is warm and dry.     Capillary Refill: Capillary refill takes less than 2 seconds.     Findings: No rash.    Neurological:     General: No focal deficit present.     Mental Status: She is alert and oriented to person, place, and time. Mental status is at baseline.  Psychiatric:        Mood and Affect: Mood normal.        Behavior: Behavior normal.        Thought Content: Thought content normal.        Judgment: Judgment normal.       Depression Screen  PHQ 2/9 Scores 09/16/2019 07/18/2018 08/04/2016  PHQ - 2 Score 0 0 0  PHQ- 9 Score - 0 0    No results found for any visits on 09/30/19.    Assessment & Plan:    Routine Health Maintenance and Physical Exam  Exercise Activities and Dietary recommendations Goals    . Reduce caffeine intake     Recommend to continue to cut back on sweet tea intake and increase water intake .        Immunization History  Administered Date(s) Administered  . Influenza Split 04/19/2006, 07/17/2009  . Influenza, High Dose Seasonal PF 10/08/2018  . Influenza,inj,Quad PF,6+ Mos 02/13/2013, 06/24/2015  . Influenza-Unspecified 04/06/2018, 10/08/2018  . PFIZER SARS-COV-2 Vaccination 07/29/2019, 08/20/2019  . Pneumococcal Conjugate-13 06/24/2015  . Pneumococcal Polysaccharide-23 02/13/2013  . Tdap 03/27/2008, 10/08/2018  . Zoster 08/03/2012    Health Maintenance  Topic Date Due  . PNA vac Low Risk Adult (2 of 2 - PPSV23) 06/28/2018  . INFLUENZA VACCINE  01/05/2020  . MAMMOGRAM  01/17/2021  . Fecal DNA (Cologuard)  12/09/2021  . TETANUS/TDAP  10/07/2028  . DEXA SCAN  Completed  . COVID-19 Vaccine  Completed  . Hepatitis C Screening  Completed    Discussed health benefits of physical activity, and encouraged her to engage in regular exercise appropriate for her age and condition.  1. Annual physical exam Normal physical exam today. Will check labs as below and f/u pending lab results. If labs are stable and WNL she will not need to have these rechecked for one year at her next annual physical exam. She is to call the office in the meantime if she  has any acute issue, questions or concerns. - CBC   2. Breast cancer screening by mammogram Breast exam today was normal. There is no family history of breast cancer. She does perform regular self breast exams. Mammogram was ordered as below. Information for [facility] clinic was given to patient so she may schedule her mammogram at her convenience. - MM 3D Screening Bilateral; Future  3. Adult hypothyroidism Followed by Endocrinology. Continue Levothyroxine 19mcg. Will check labs as below and f/u pending results. - TSH  4. Hypercholesterolemia Diet controlled. Will check labs as below and f/u pending results. - Basic metabolic panel - CBC - Lipid panel  5. Obstructive sleep apnea Stable. Uses CPAP nightly.   6. Stage 3b chronic kidney disease Followed by Nephrology, Dr. Candiss Norse. Will check labs as below and f/u pending results. - Basic metabolic panel  7. History of cervical cancer Known cervical cancer history, s/p hysterectomy. Will obtain pap next year as it has been over 5 years since previous.  8. Borderline diabetes Diet controlled. Will check labs as below and f/u pending results. - Basic metabolic panel - HgB [id number]  No follow-ups on file.     Reynolds Bowl, PA-C, have reviewed all documentation for this visit. The documentation on 09/30/19 for the exam, diagnosis, procedures, and orders are all accurate and complete.'   Rubye Beach  [facility] [phone] (phone) [phone] (fax)  [place]

## 2019-09-30 ENCOUNTER — Other Ambulatory Visit: Payer: Self-pay

## 2019-09-30 ENCOUNTER — Ambulatory Visit (INDEPENDENT_AMBULATORY_CARE_PROVIDER_SITE_OTHER): Payer: PPO | Admitting: Physician Assistant

## 2019-09-30 ENCOUNTER — Other Ambulatory Visit: Payer: Self-pay | Admitting: Physician Assistant

## 2019-09-30 ENCOUNTER — Encounter: Payer: Self-pay | Admitting: Physician Assistant

## 2019-09-30 VITALS — BP 142/75 | HR 65 | Temp 96.9°F | Resp 16 | Ht 68.0 in | Wt 240.0 lb

## 2019-09-30 DIAGNOSIS — Z8541 Personal history of malignant neoplasm of cervix uteri: Secondary | ICD-10-CM

## 2019-09-30 DIAGNOSIS — R7303 Prediabetes: Secondary | ICD-10-CM

## 2019-09-30 DIAGNOSIS — N1832 Chronic kidney disease, stage 3b: Secondary | ICD-10-CM | POA: Diagnosis not present

## 2019-09-30 DIAGNOSIS — Z0000 Encounter for general adult medical examination without abnormal findings: Secondary | ICD-10-CM

## 2019-09-30 DIAGNOSIS — G4733 Obstructive sleep apnea (adult) (pediatric): Secondary | ICD-10-CM | POA: Diagnosis not present

## 2019-09-30 DIAGNOSIS — E7800 Pure hypercholesterolemia, unspecified: Secondary | ICD-10-CM

## 2019-09-30 DIAGNOSIS — Z1231 Encounter for screening mammogram for malignant neoplasm of breast: Secondary | ICD-10-CM | POA: Diagnosis not present

## 2019-09-30 DIAGNOSIS — Z1329 Encounter for screening for other suspected endocrine disorder: Secondary | ICD-10-CM | POA: Diagnosis not present

## 2019-09-30 DIAGNOSIS — E039 Hypothyroidism, unspecified: Secondary | ICD-10-CM

## 2019-09-30 DIAGNOSIS — Z13228 Encounter for screening for other metabolic disorders: Secondary | ICD-10-CM | POA: Diagnosis not present

## 2019-09-30 NOTE — Patient Instructions (Signed)

## 2019-10-01 ENCOUNTER — Telehealth: Payer: Self-pay

## 2019-10-01 LAB — LIPID PANEL
Chol/HDL Ratio: 2.9 ratio (ref 0.0–4.4)
Cholesterol, Total: 186 mg/dL (ref 100–199)
HDL: 65 mg/dL (ref >39)
LDL Chol Calc (NIH): 104 mg/dL — ABNORMAL HIGH (ref 0–99)
Triglycerides: 95 mg/dL (ref 0–149)
VLDL Cholesterol Cal: 17 mg/dL (ref 5–40)

## 2019-10-01 LAB — CBC
Hematocrit: 41.1 % (ref 34.0–46.6)
Hemoglobin: 13.1 g/dL (ref 11.1–15.9)
MCH: 27.1 pg (ref 26.6–33.0)
MCHC: 31.9 g/dL (ref 31.5–35.7)
MCV: 85 fL (ref 79–97)
Platelets: 215 10*3/uL (ref 150–450)
RBC: 4.84 x10E6/uL (ref 3.77–5.28)
RDW: 14.1 % (ref 11.7–15.4)
WBC: 5.9 10*3/uL (ref 3.4–10.8)

## 2019-10-01 LAB — HEMOGLOBIN A1C
Est. average glucose Bld gHb Est-mCnc: 131 mg/dL
Hgb A1c MFr Bld: 6.2 % — ABNORMAL HIGH (ref 4.8–5.6)

## 2019-10-01 LAB — BASIC METABOLIC PANEL
BUN/Creatinine Ratio: 13 (ref 12–28)
BUN: 16 mg/dL (ref 8–27)
CO2: 26 mmol/L (ref 20–29)
Calcium: 9.5 mg/dL (ref 8.7–10.3)
Chloride: 101 mmol/L (ref 96–106)
Creatinine, Ser: 1.23 mg/dL — ABNORMAL HIGH (ref 0.57–1.00)
GFR calc Af Amer: 53 mL/min/{1.73_m2} — ABNORMAL LOW (ref >59)
GFR calc non Af Amer: 46 mL/min/{1.73_m2} — ABNORMAL LOW (ref >59)
Glucose: 89 mg/dL (ref 65–99)
Potassium: 3.8 mmol/L (ref 3.5–5.2)
Sodium: 140 mmol/L (ref 134–144)

## 2019-10-01 LAB — TSH: TSH: 3.91 u[IU]/mL (ref 0.450–4.500)

## 2019-10-01 NOTE — Telephone Encounter (Signed)
-----   Message from Mar Daring, PA-C sent at 10/01/2019 11:13 AM EDT ----- Blood count is normal. Kidney function is stable. Sodium, potassium and calcium are normal. Cholesterol is normal. A1c/sugar is stable at 6.2. Had been 6.3. Thyroid is normal.

## 2019-10-01 NOTE — Telephone Encounter (Signed)
[employer]   Result Notes and Comments to Patient Comment seen by patient [name] on 10/01/2019 11:15 AM EDT

## 2019-10-02 DIAGNOSIS — S42252A Displaced fracture of greater tuberosity of left humerus, initial encounter for closed fracture: Secondary | ICD-10-CM | POA: Diagnosis not present

## 2019-10-03 ENCOUNTER — Other Ambulatory Visit: Payer: Self-pay | Admitting: Physician Assistant

## 2019-10-03 DIAGNOSIS — E039 Hypothyroidism, unspecified: Secondary | ICD-10-CM

## 2019-10-03 DIAGNOSIS — J301 Allergic rhinitis due to pollen: Secondary | ICD-10-CM

## 2019-10-03 DIAGNOSIS — J4520 Mild intermittent asthma, uncomplicated: Secondary | ICD-10-CM

## 2019-10-03 DIAGNOSIS — M1A09X0 Idiopathic chronic gout, multiple sites, without tophus (tophi): Secondary | ICD-10-CM

## 2019-10-03 NOTE — Telephone Encounter (Signed)
Requested Prescriptions  Pending Prescriptions Disp Refills  . fluticasone (FLONASE) 50 MCG/ACT nasal spray [Pharmacy Med Name: FLUTICASONE PROPIONATE 50 MCG/ACT N] 16 g 0    Sig: USE 2 SPRAYS INTO EACH NOSTRIL DAILY     Ear, Nose, and Throat: Nasal Preparations - Corticosteroids Failed - 10/03/2019  4:43 PM      Failed - Valid encounter within last 12 months    Recent Outpatient Visits          3 days ago Annual physical exam   [facility] [place], Clearnce Sorrel, [place]   1 year ago [facility] [employer] preventive visit   [place], [place], [place]   2 years ago Sprain of anterior talofibular ligament of right ankle, initial encounter   [place], [place], [place]   3 years ago Acute gout involving toe of right foot, unspecified cause   [facility] [place], Clearnce Sorrel, [place]   3 years ago Annual physical exam   [facility] [place], [place] M, [place]             . allopurinol (ZYLOPRIM) 100 MG tablet [Pharmacy Med Name: ALLOPURINOL 100 MG TAB] 90 tablet 0    Sig: TAKE 1 TABLET BY MOUTH DAILY     Endocrinology:  Gout Agents Failed - 10/03/2019  4:43 PM      Failed - Uric Acid in normal range and within 360 days    Uric Acid  Date Value Ref Range Status  09/16/2016 8.4 (H) 2.5 - 7.1 mg/dL Final    Comment:               Therapeutic target for gout patients: <6.0         Failed - Cr in normal range and within 360 days    Creatinine, Ser  Date Value Ref Range Status  09/30/2019 1.23 (H) 0.57 - 1.00 mg/dL Final         Failed - Valid encounter within last 12 months    Recent Outpatient Visits          3 days ago Annual physical exam   [facility] [place], Clearnce Sorrel, [place]   1 year ago [facility] [employer] preventive visit   [place], [place], [place]   2 years ago Sprain of anterior talofibular ligament of right ankle, initial encounter   [place], [place], [place]   3 years ago Acute gout involving toe of right foot, unspecified cause   [place], Clearnce Sorrel, [place]   3 years ago Annual physical exam   [facility] [place], [place]             . levothyroxine (SYNTHROID) 50 MCG tablet [Pharmacy Med Name: LEVOTHYROXINE SODIUM 50 MCG TAB] 90 tablet 0    Sig: TAKE ONE TABLET [place]     Endocrinology:  Hypothyroid Agents Failed - 10/03/2019  4:43 PM      Failed - TSH needs to be rechecked within 3 months after an abnormal result. Refill until TSH is due.      Failed - Valid encounter within last 12 months    Recent Outpatient Visits          3 days ago Annual physical exam   [facility] [place], Clearnce Sorrel, [place]   1 year ago [facility] [employer] preventive visit   [place], [place], [place]  2 years ago Sprain of anterior talofibular ligament of right ankle, initial encounter   [place], [place], [place]   3 years ago Acute gout involving toe of right foot, unspecified cause   [place], Clearnce Sorrel, [place]   3 years ago Annual physical exam   [facility] [place] M, PA-C             Passed - TSH in normal range and within 360 days    TSH  Date Value Ref Range Status  09/30/2019 3.910 0.450 - 4.500 uIU/mL Final

## 2019-10-11 DIAGNOSIS — G4733 Obstructive sleep apnea (adult) (pediatric): Secondary | ICD-10-CM | POA: Diagnosis not present

## 2019-10-14 ENCOUNTER — Encounter: Payer: Self-pay | Admitting: Physician Assistant

## 2019-10-14 DIAGNOSIS — N183 Chronic kidney disease, stage 3 unspecified: Secondary | ICD-10-CM

## 2019-10-14 MED ORDER — FUROSEMIDE 40 MG PO TABS: 40.0000 mg | ORAL_TABLET | Freq: Two times a day (BID) | ORAL | 1 refills | Status: DC

## 2019-10-25 ENCOUNTER — Other Ambulatory Visit: Payer: Self-pay | Admitting: Physician Assistant

## 2019-10-25 ENCOUNTER — Encounter: Payer: Self-pay | Admitting: Physician Assistant

## 2019-10-25 DIAGNOSIS — J301 Allergic rhinitis due to pollen: Secondary | ICD-10-CM

## 2019-10-25 MED ORDER — FLUTICASONE PROPIONATE 50 MCG/ACT NA SUSP: 2.0000 | Freq: Every day | NASAL | 0 refills | Status: DC

## 2019-10-25 NOTE — Telephone Encounter (Signed)
Requested Prescriptions  Pending Prescriptions Disp Refills  . potassium chloride (KLOR-CON) 10 MEQ tablet [Pharmacy Med Name: POTASSIUM CHLORIDE CRYS ER 10 MEQ T] 60 tablet 5    Sig: TAKE TWO TABLETS TWICE DAILY     Endocrinology:  Minerals - Potassium Supplementation Failed - 10/25/2019  2:03 PM      Failed - Cr in normal range and within 360 days    Creatinine, Ser  Date Value Ref Range Status  09/30/2019 1.23 (H) 0.57 - 1.00 mg/dL Final         Failed - Valid encounter within last 12 months    Recent Outpatient Visits          3 weeks ago Annual physical exam   [facility] [place], Clearnce Sorrel, [place]   1 year ago [facility] [employer] preventive visit   [place], [place], [place]   2 years ago Sprain of anterior talofibular ligament of right ankle, initial encounter   [place], [place], [place]   3 years ago Acute gout involving toe of right foot, unspecified cause   [place], [place], [place]   3 years ago Annual physical exam   [facility] [place] M, PA-C             Passed - K in normal range and within 360 days    Potassium  Date Value Ref Range Status  09/30/2019 3.8 3.5 - 5.2 mmol/L Final

## 2019-11-11 DIAGNOSIS — G4733 Obstructive sleep apnea (adult) (pediatric): Secondary | ICD-10-CM | POA: Diagnosis not present

## 2019-11-26 ENCOUNTER — Other Ambulatory Visit: Payer: Self-pay | Admitting: Physician Assistant

## 2019-11-26 DIAGNOSIS — J4520 Mild intermittent asthma, uncomplicated: Secondary | ICD-10-CM

## 2019-11-26 DIAGNOSIS — E039 Hypothyroidism, unspecified: Secondary | ICD-10-CM

## 2019-11-26 DIAGNOSIS — J301 Allergic rhinitis due to pollen: Secondary | ICD-10-CM

## 2019-11-27 DIAGNOSIS — G4733 Obstructive sleep apnea (adult) (pediatric): Secondary | ICD-10-CM | POA: Diagnosis not present

## 2019-12-11 DIAGNOSIS — G4733 Obstructive sleep apnea (adult) (pediatric): Secondary | ICD-10-CM | POA: Diagnosis not present

## 2020-01-11 DIAGNOSIS — G4733 Obstructive sleep apnea (adult) (pediatric): Secondary | ICD-10-CM | POA: Diagnosis not present

## 2020-02-11 DIAGNOSIS — G4733 Obstructive sleep apnea (adult) (pediatric): Secondary | ICD-10-CM | POA: Diagnosis not present

## 2020-02-12 DIAGNOSIS — H2513 Age-related nuclear cataract, bilateral: Secondary | ICD-10-CM | POA: Diagnosis not present

## 2020-02-18 ENCOUNTER — Encounter: Payer: Self-pay | Admitting: Physician Assistant

## 2020-02-21 ENCOUNTER — Other Ambulatory Visit: Payer: Self-pay | Admitting: Physician Assistant

## 2020-02-21 DIAGNOSIS — M1A09X0 Idiopathic chronic gout, multiple sites, without tophus (tophi): Secondary | ICD-10-CM

## 2020-02-21 DIAGNOSIS — J301 Allergic rhinitis due to pollen: Secondary | ICD-10-CM

## 2020-02-21 NOTE — Telephone Encounter (Signed)
Requested medication (s) are due for refill today: yes  Requested medication (s) are on the active medication list: yes  Last refill:  10/25/19  48g  0refills  Future visit scheduled: yes  Notes to clinic: Patient also has azelastine Fluticasone in chart (not taking was reported 09/16/19. Did not fill request because of duplicate warning. Please review.    Requested Prescriptions  Pending Prescriptions Disp Refills   fluticasone (FLONASE) 50 MCG/ACT nasal spray [Pharmacy Med Name: FLUTICASONE PROPIONATE 50 MCG/ACT N] 16 g 1    Sig: USE 2 SPRAYS INTO EACH NOSTRIL DAILY      Ear, Nose, and Throat: Nasal Preparations - Corticosteroids Passed - 02/21/2020 12:49 PM      Passed - Valid encounter within last 12 months    Recent Outpatient Visits           4 months ago Annual physical exam   [facility] [place], Clearnce Sorrel, [place]   1 year ago [facility] [employer] preventive visit   [place], [place], [place]   2 years ago Sprain of anterior talofibular ligament of right ankle, initial encounter   [place], [place], [place]   3 years ago Acute gout involving toe of right foot, unspecified cause   [facility] [place], Clearnce Sorrel, [place]   3 years ago Annual physical exam   [place], Clearnce Sorrel, [place]       Future Appointments             In 7 months [place], Clearnce Sorrel, PA-C [employer], [place]

## 2020-02-27 DIAGNOSIS — G4733 Obstructive sleep apnea (adult) (pediatric): Secondary | ICD-10-CM | POA: Diagnosis not present

## 2020-03-12 DIAGNOSIS — G4733 Obstructive sleep apnea (adult) (pediatric): Secondary | ICD-10-CM | POA: Diagnosis not present

## 2020-03-17 DIAGNOSIS — H1012 Acute atopic conjunctivitis, left eye: Secondary | ICD-10-CM | POA: Diagnosis not present

## 2020-03-18 ENCOUNTER — Encounter: Payer: Self-pay | Admitting: Physician Assistant

## 2020-03-18 ENCOUNTER — Ambulatory Visit (INDEPENDENT_AMBULATORY_CARE_PROVIDER_SITE_OTHER): Payer: PPO | Admitting: Physician Assistant

## 2020-03-18 VITALS — BP 131/69 | HR 62 | Temp 98.2°F | Resp 16 | Wt 238.8 lb

## 2020-03-18 DIAGNOSIS — H109 Unspecified conjunctivitis: Secondary | ICD-10-CM | POA: Diagnosis not present

## 2020-03-18 DIAGNOSIS — J358 Other chronic diseases of tonsils and adenoids: Secondary | ICD-10-CM

## 2020-03-18 DIAGNOSIS — T7840XA Allergy, unspecified, initial encounter: Secondary | ICD-10-CM

## 2020-03-18 MED ORDER — CEFDINIR 300 MG PO CAPS: 300.0000 mg | ORAL_CAPSULE | Freq: Two times a day (BID) | ORAL | 0 refills | Status: DC

## 2020-03-18 MED ORDER — OLOPATADINE HCL 0.1 % OP SOLN: 1.0000 [drp] | Freq: Every day | OPHTHALMIC | 5 refills | Status: DC

## 2020-03-18 NOTE — Progress Notes (Signed)
Established patient visit   Patient: [name]   DOB: 03-28-1956   64 y.o. Female  MRN: [id number] Visit Date: 03/18/2020  Today's healthcare provider: Mar Daring, PA-C   Chief Complaint  Patient presents with  . Conjunctivitis   Subjective    HPI HPI    Conjunctivitis    In both eyes (Yesterday morning in was on the left eye. She went to see [facility] yesterday. Per patient the doctor doesn't think is a pink eye. She was prescribed Zaditor an B-Maxitrol).  Associated symptoms include itching, discharge and tearing.  Negative for eye pain.  Pain was noted as 0/10.  Present during allergy flare and recent URI (White patches in the back of her throat.).  Occurring constantly.  It is worse throughout the day.  Since onset it is stable.  Treatments tried include warm compresses and allergy drops (B-Maxitrol).  Response to treatment was mild improvement.       Last edited by Doristine Devoid, CMA on 03/18/2020  7:05 AM. (History)      She also need a refill on her eye drops Patanol.  Started in left eye a few days ago and now in right eye. Having purulent discharge in the left eye now. Also with white patches on her throat without a having a sore throat.   Patient Active Problem List   Diagnosis Date Noted  . Abortion, spontaneous 01/28/2015  . Allergic rhinitis 01/28/2015  . Angular blepharoconjunctivitis 01/28/2015  . Asthma 01/28/2015  . History of cervical cancer 01/28/2015  . Chronic kidney disease (CKD), stage III (moderate) ([place]) 01/28/2015  . Dermatitis, eczematoid 01/28/2015  . Edema 01/28/2015  . Esophagitis, reflux 01/28/2015  . Exposure to viral hepatitis 01/28/2015  . Closed fracture of foot 01/28/2015  . Hiatal hernia 01/28/2015  . Decreased potassium in the blood 01/28/2015  . Adult hypothyroidism 01/28/2015  . Nonspecific ST-T changes 01/28/2015  . Obstructive sleep apnea 01/28/2015  . Borderline diabetes 01/28/2015  . Hypercholesterolemia  without hypertriglyceridemia 01/28/2015  . Thyroid nodule 01/28/2015  . Vitamin D deficiency 01/28/2015  . SHORTNESS OF BREATH 02/17/2010   Past Medical History:  Diagnosis Date  . Broken arm   . Cervical cancer ([place])   . Chronic kidney disease, stage III (moderate) ([facility])   . Foot fracture 2013  . Hiatal hernia   . Hypopotassemia   . OSA on CPAP   . Other abnormal glucose   . Pure hypercholesterolemia   . Reflux esophagitis   . Sleep apnea   . Thyroid fullness   . Thyroid nodule   . Unspecified vitamin D deficiency        Medications: Outpatient Medications Prior to Visit  Medication Sig  . albuterol (VENTOLIN HFA) 108 (90 Base) MCG/ACT inhaler Inhale 2 puffs into the lungs every 6 (six) hours as needed.  Marland Kitchen allopurinol (ZYLOPRIM) 100 MG tablet TAKE ONE TABLET EVERY DAY  . clobetasol (TEMOVATE) 0.05 % external solution Apply 1 application topically as needed. Due to CPAP use  . fluticasone (FLONASE) 50 MCG/ACT nasal spray USE 2 SPRAYS INTO EACH NOSTRIL DAILY  . furosemide (LASIX) 40 MG tablet Take 1 tablet (40 mg total) by mouth 2 (two) times daily.  Marland Kitchen levothyroxine (SYNTHROID) 50 MCG tablet TAKE ONE TABLET EACH MORNING BEFORE BREAKFAST  . metolazone (ZAROXOLYN) 5 MG tablet Take 1 tablet (5 mg total) by mouth daily as needed.  . montelukast (SINGULAIR) 10 MG tablet TAKE 1 TABLET BY MOUTH AT BEDTIME  .  NON FORMULARY CPAP nightly.  Marland Kitchen olopatadine (PATANOL) 0.1 % ophthalmic solution Place 1 drop into both eyes daily. As needed  . Omega-3 Fatty Acids (FISH OIL) 1200 MG CAPS Take 2 capsules twice a day.  . potassium chloride (KLOR-CON) 10 MEQ tablet TAKE TWO TABLETS TWICE DAILY  . Vitamin D, Ergocalciferol, (DRISDOL) 1.25 MG (50000 UNIT) CAPS capsule TAKE ONE CAPSULE BY MOUTH ONCE A WEEK  . Azelastine-Fluticasone (DYMISTA) 137-50 MCG/ACT SUSP Place 2 sprays into the nose daily. (Patient not taking: Reported on 09/16/2019)  . potassium chloride (KLOR-CON) 10 MEQ tablet TAKE TWO  TABLETS TWICE DAILY   No facility-administered medications prior to visit.    Review of Systems  Constitutional: Negative for fatigue and fever.  HENT: Positive for congestion. Negative for postnasal drip, sinus pressure, sinus pain and sore throat (white patches on left throat).   Respiratory: Negative.   Cardiovascular: Negative.   Neurological: Negative.     Last CBC Lab Results  Component Value Date   WBC 5.9 09/30/2019   HGB 13.1 09/30/2019   HCT 41.1 09/30/2019   MCV 85 09/30/2019   MCH 27.1 09/30/2019   RDW 14.1 09/30/2019   PLT 215 00/93/8182   Last metabolic panel Lab Results  Component Value Date   GLUCOSE 89 09/30/2019   NA 140 09/30/2019   K 3.8 09/30/2019   CL 101 09/30/2019   CO2 26 09/30/2019   BUN 16 09/30/2019   CREATININE 1.23 (H) 09/30/2019   GFRNONAA 46 (L) 09/30/2019   GFRAA 53 (L) 09/30/2019   CALCIUM 9.5 09/30/2019   PROT 6.2 12/10/2018   ALBUMIN 4.3 12/10/2018   LABGLOB 1.9 12/10/2018   AGRATIO 2.3 (H) 12/10/2018   BILITOT 0.4 12/10/2018   ALKPHOS 108 12/10/2018   AST 15 12/10/2018   ALT 13 12/10/2018      Objective    BP 131/69 (BP Location: Left Arm, Patient Position: Sitting, Cuff Size: Large)   Pulse 62   Temp 98.2 F (36.8 C) (Oral)   Resp 16   Wt 238 lb 12.8 oz (108.3 kg)   BMI 36.31 kg/m  BP Readings from Last 3 Encounters:  03/18/20 131/69  09/30/19 (!) 142/75  07/18/18 (!) 141/85   Wt Readings from Last 3 Encounters:  03/18/20 238 lb 12.8 oz (108.3 kg)  09/30/19 240 lb (108.9 kg)  07/18/18 229 lb (103.9 kg)      Physical Exam Vitals reviewed.  Constitutional:      General: She is not in acute distress.    Appearance: Normal appearance. She is well-developed. She is not ill-appearing or diaphoretic.  HENT:     Head: Normocephalic and atraumatic.     Right Ear: Hearing, tympanic membrane, ear canal and external ear normal.     Left Ear: Hearing, tympanic membrane, ear canal and external ear normal.     Nose:  Nose normal.     Mouth/Throat:     Pharynx: Oropharynx is clear. Uvula midline. No pharyngeal swelling, oropharyngeal exudate or posterior oropharyngeal erythema.   Eyes:     General: No scleral icterus.       Right eye: No discharge.        Left eye: No discharge.     Conjunctiva/sclera: Conjunctivae normal.     Pupils: Pupils are equal, round, and reactive to light.  Neck:     Thyroid: No thyromegaly.     Trachea: No tracheal deviation.  Cardiovascular:     Rate and Rhythm: Normal rate and regular rhythm.  Heart sounds: Normal heart sounds. No murmur heard.  No friction rub. No gallop.   Pulmonary:     Effort: Pulmonary effort is normal. No respiratory distress.     Breath sounds: Normal breath sounds. No stridor. No wheezing or rales.  Musculoskeletal:     Cervical back: Normal range of motion and neck supple.  Lymphadenopathy:     Cervical: No cervical adenopathy.  Skin:    General: Skin is warm and dry.  Neurological:     Mental Status: She is alert.      No results found for any visits on 03/18/20.  Assessment & Plan     1. Bacterial conjunctivitis Will treat with Omnicef as below for bacterial conjunctivitis and to cover for any infectious process in the throat, however, I suspect more tonsillar stones. Discussed pushing fluids and can use sour candies for stones.  - cefdinir (OMNICEF) 300 MG capsule; Take 1 capsule (300 mg total) by mouth 2 (two) times daily.  Dispense: 14 capsule; Refill: 0  2. Allergy, initial encounter Stable. Diagnosis pulled for medication refill. Continue current medical treatment plan. - olopatadine (PATANOL) 0.1 % ophthalmic solution; Place 1 drop into both eyes daily. As needed  Dispense: 5 mL; Refill: 5  3. Tonsil stone Suspect stone. No pain, redness or swelling of tonsil on left indicating infectious process. See above medical treatment plan for #1.    No follow-ups on file.      Reynolds Bowl, PA-C, have reviewed all  documentation for this visit. The documentation on 03/18/20 for the exam, diagnosis, procedures, and orders are all accurate and complete.   Rubye Beach  [facility] [phone] (phone) [phone] (fax)  [place]

## 2020-03-18 NOTE — Patient Instructions (Signed)
Bacterial Conjunctivitis, Adult Bacterial conjunctivitis is an infection of your conjunctiva. This is the clear membrane that covers the white part of your eye and the inner part of your eyelid. This infection can make your eye:  Red or pink.  Itchy. This condition spreads easily from person to person (is contagious) and from one eye to the other eye. What are the causes?  This condition is caused by germs (bacteria). You may get the infection if you come into close contact with: ? A person who has the infection. ? Items that have germs on them (are contaminated), such as face towels, contact lens solution, or eye makeup. What increases the risk? You are more likely to get this condition if you:  Have contact with people who have the infection.  Wear contact lenses.  Have a sinus infection.  Have had a recent eye injury or surgery.  Have a weak body defense system (immune system).  Have dry eyes. What are the signs or symptoms?   Thick, yellowish discharge from the eye.  Tearing or watery eyes.  Itchy eyes.  Burning feeling in your eyes.  Eye redness.  Swollen eyelids.  Blurred vision. How is this treated?   Antibiotic eye drops or ointment.  Antibiotic medicine taken by mouth. This is used for infections that do not get better with drops or ointment or that last more than 10 days.  Cool, wet cloths placed on the eyes.  Artificial tears used 2-6 times a day. Follow these instructions at home: Medicines  Take or apply your antibiotic medicine as told by your doctor. Do not stop taking or applying the antibiotic even if you start to feel better.  Take or apply over-the-counter and prescription medicines only as told by your doctor.  Do not touch your eyelid with the eye-drop bottle or the ointment tube. Managing discomfort  Wipe any fluid from your eye with a warm, wet washcloth or a cotton ball.  Place a clean, cool, wet cloth on your eye. Do this for  10-20 minutes, 3-4 times per day. General instructions  Do not wear contacts until the infection is gone. Wear glasses until your doctor says it is okay to wear contacts again.  Do not wear eye makeup until the infection is gone. Throw away old eye makeup.  Change or wash your pillowcase every day.  Do not share towels or washcloths.  Wash your hands often with soap and water. Use paper towels to dry your hands.  Do not touch or rub your eyes.  Do not drive or use heavy machinery if your vision is blurred. Contact a doctor if:  You have a fever.  You do not get better after 10 days. Get help right away if:  You have a fever and your symptoms get worse all of a sudden.  You have very bad pain when you move your eye.  Your face: ? Hurts. ? Is red. ? Is swollen.  You have sudden loss of vision. Summary  Bacterial conjunctivitis is an infection of your conjunctiva.  This infection spreads easily from person to person.  Wash your hands often with soap and water. Use paper towels to dry your hands.  Take or apply your antibiotic medicine as told by your doctor.  Contact a doctor if you have a fever or you do not get better after 10 days. This information is not intended to replace advice given to you by your health care provider. Make sure you discuss any   questions you have with your health care provider. Document Revised: 09/11/2018 Document Reviewed: 12/27/2017 Elsevier Patient Education  [place].   Tonsillectomy, Adult, Care After This sheet gives you information about how to care for yourself after your procedure. Your doctor may also give you more specific instructions. If you have problems or questions, contact your doctor. Follow these instructions at home: Eating and drinking   Follow instructions from your doctor about eating and drinking.  For many days after surgery, choose foods that are soft and cold. Examples  are: ? Gelatin. ? Sherbet. ? Ice cream. ? Frozen ice pops.  If you have an upset stomach (are nauseous), choose liquids that are cold and that you can see through (clear liquids). Examples are water and apple juice without pulp. You can try thick liquids and soft foods when you can eat without throwing up (vomiting) and without too much pain. Examples are: ? Creamed soups. ? Soft, warm cereals, such as oatmeal or hot wheat cereal. ? Milk. ? Mashed potatoes. ? Applesauce.  Drink enough fluid to keep your pee (urine) clear or pale yellow. Driving  Do not drive for 24 hours if you were given a medicine to help you relax (sedative).  Do not drive or use heavy machinery while taking prescription pain medicine or until your health care provider approves. General instructions  Rest.  Keep your head raised (elevated) when lying down.  Take medicines only as told by your doctor. These include over-the-counter medicines and prescription medicines.  Do not use mouthwashes until your doctor says it is okay.  Gargle only as told by your doctor.  Stay away from people who are sick. Contact a doctor if:  Your pain gets worse or medicines do not help.  You have a fever.  You have a rash.  You feel light-headed or you pass out (faint).  You cannot swallow even a little liquid or spit (saliva).  Your pee is very dark. Get help right away if:  You have trouble breathing.  You bleed bright red blood from your throat.  You throw up bright red blood. Summary  Follow instructions from your doctor about what you cannot eat or drink. For many days after surgery, choose foods that are soft and cold.  Talk with your doctor about how to keep your pain under control. This can help you rest and swallow better.  Get help right away if you bleed bright red blood from your throat or you throw up bright red blood. This information is not intended to replace advice given to you by your health  care provider. Make sure you discuss any questions you have with your health care provider. Document Revised: 05/05/2017 Document Reviewed: 04/15/2016 Elsevier Patient Education  2020 [employer].

## 2020-03-19 DIAGNOSIS — L249 Irritant contact dermatitis, unspecified cause: Secondary | ICD-10-CM | POA: Diagnosis not present

## 2020-03-30 ENCOUNTER — Encounter: Payer: Self-pay | Admitting: Physician Assistant

## 2020-04-13 ENCOUNTER — Encounter: Payer: Self-pay | Admitting: Physician Assistant

## 2020-04-13 DIAGNOSIS — H5789 Other specified disorders of eye and adnexa: Secondary | ICD-10-CM

## 2020-04-15 MED ORDER — HYDROXYZINE HCL 25 MG PO TABS: 25.0000 mg | ORAL_TABLET | Freq: Three times a day (TID) | ORAL | 0 refills | Status: DC | PRN

## 2020-04-15 NOTE — Addendum Note (Signed)
Addended by: Mar Daring on: 04/15/2020 01:51 PM   Modules accepted: Orders

## 2020-04-21 DIAGNOSIS — L738 Other specified follicular disorders: Secondary | ICD-10-CM | POA: Diagnosis not present

## 2020-04-21 DIAGNOSIS — L2389 Allergic contact dermatitis due to other agents: Secondary | ICD-10-CM | POA: Diagnosis not present

## 2020-05-18 ENCOUNTER — Other Ambulatory Visit: Payer: Self-pay

## 2020-05-18 ENCOUNTER — Ambulatory Visit (INDEPENDENT_AMBULATORY_CARE_PROVIDER_SITE_OTHER): Payer: PPO | Admitting: Physician Assistant

## 2020-05-18 ENCOUNTER — Encounter: Payer: Self-pay | Admitting: Physician Assistant

## 2020-05-18 VITALS — BP 137/76 | HR 57 | Temp 98.2°F | Wt 238.0 lb

## 2020-05-18 DIAGNOSIS — S39012A Strain of muscle, fascia and tendon of lower back, initial encounter: Secondary | ICD-10-CM

## 2020-05-18 MED ORDER — CYCLOBENZAPRINE HCL 5 MG PO TABS: 5.0000 mg | ORAL_TABLET | Freq: Three times a day (TID) | ORAL | 1 refills | Status: DC | PRN

## 2020-05-18 MED ORDER — IBUPROFEN 600 MG PO TABS: 600.0000 mg | ORAL_TABLET | Freq: Three times a day (TID) | ORAL | 1 refills | Status: DC | PRN

## 2020-05-18 NOTE — Patient Instructions (Signed)
Lumbar Strain A lumbar strain, which is sometimes called a low-back strain, is a stretch or tear in a muscle or the strong cords of tissue that attach muscle to bone (tendons) in the lower back (lumbar spine). This type of injury occurs when muscles or tendons are torn or are stretched beyond their limits. Lumbar strains can range from mild to severe. Mild strains may involve stretching a muscle or tendon without tearing it. These may heal in 1-2 weeks. More severe strains involve tearing of muscle fibers or tendons. These will cause more pain and may take 6-8 weeks to heal. What are the causes? This condition may be caused by:  Trauma, such as a fall or a hit to the body.  Twisting or overstretching the back. This may result from doing activities that need a lot of energy, such as lifting heavy objects. What increases the risk? This injury is more common in:  Athletes.  People with obesity.  People who do repeated lifting, bending, or other movements that involve their back. What are the signs or symptoms? Symptoms of this condition may include:  Sharp or dull pain in the lower back that does not go away. The pain may extend to the buttocks.  Stiffness or limited range of motion.  Sudden muscle tightening (spasms). How is this diagnosed? This condition may be diagnosed based on:  Your symptoms.  Your medical history.  A physical exam.  Imaging tests, such as: ? X-rays. ? MRI. How is this treated? Treatment for this condition may include:  Rest.  Applying heat and cold to the affected area.  Over-the-counter medicines to help relieve pain and inflammation, such as NSAIDs.  Prescription pain medicine and muscle relaxants may be needed for a short time.  Physical therapy. Follow these instructions at home: Managing pain, stiffness, and swelling      If directed, put ice on the injured area during the first 24 hours after your injury. ? Put ice in a plastic  bag. ? Place a towel between your skin and the bag. ? Leave the ice on for 20 minutes, 2-3 times a day.  If directed, apply heat to the affected area as often as told by your health care provider. Use the heat source that your health care provider recommends, such as a moist heat pack or a heating pad. ? Place a towel between your skin and the heat source. ? Leave the heat on for 20-30 minutes. ? Remove the heat if your skin turns bright red. This is especially important if you are unable to feel pain, heat, or cold. You may have a greater risk of getting burned. Activity  Rest and return to your normal activities as told by your health care provider. Ask your health care provider what activities are safe for you.  Do exercises as told by your health care provider. Medicines  Take over-the-counter and prescription medicines only as told by your health care provider.  Ask your health care provider if the medicine prescribed to you: ? Requires you to avoid driving or using heavy machinery. ? Can cause constipation. You may need to take these actions to prevent or treat constipation:  Drink enough fluid to keep your urine pale yellow.  Take over-the-counter or prescription medicines.  Eat foods that are high in fiber, such as beans, whole grains, and fresh fruits and vegetables.  Limit foods that are high in fat and processed sugars, such as fried or sweet foods. Injury prevention To prevent  a future low-back injury:  Always warm up properly before physical activity or sports.  Cool down and stretch after being active.  Use correct form when playing sports and lifting heavy objects. Bend your knees before you lift heavy objects.  Use good posture when sitting and standing.  Stay physically fit and keep a healthy weight. ? Do at least 150 minutes of moderate-intensity exercise each week, such as brisk walking or water aerobics. ? Do strength exercises at least 2 times each  week.  General instructions  Do not use any products that contain nicotine or tobacco, such as cigarettes, e-cigarettes, and chewing tobacco. If you need help quitting, ask your health care provider.  Keep all follow-up visits as told by your health care provider. This is important. Contact a health care provider if:  Your back pain does not improve after 6 weeks of treatment.  Your symptoms get worse. Get help right away if:  Your back pain is severe.  You are unable to stand or walk.  You develop pain in your legs.  You develop weakness in your buttocks or legs.  You have difficulty controlling when you urinate or when you have a bowel movement. ? You have frequent, painful, or bloody urination. ? You have a temperature over 101.57F (38.3C) Summary  A lumbar strain, which is sometimes called a low-back strain, is a stretch or tear in a muscle or the strong cords of tissue that attach muscle to bone (tendons) in the lower back (lumbar spine).  This type of injury occurs when muscles or tendons are torn or are stretched beyond their limits.  Rest and return to your normal activities as told by your health care provider. If directed, apply heat and ice to the affected area as often as told by your health care provider.  Take over-the-counter and prescription medicines only as told by your health care provider.  Contact a health care provider if you have new or worsening symptoms. This information is not intended to replace advice given to you by your health care provider. Make sure you discuss any questions you have with your health care provider. Document Revised: 03/22/2018 Document Reviewed: 03/22/2018 Elsevier Patient Education  [place] [facility] Ask your health care provider which exercises are safe for you. Do exercises exactly as told by your health care provider and adjust them as directed. It is normal to feel mild stretching,  pulling, tightness, or discomfort as you do these exercises. Stop right away if you feel sudden pain or your pain gets worse. Do not begin these exercises until told by your health care provider. Stretching and range-of-motion exercises These exercises warm up your muscles and joints and improve the movement and flexibility of your back. These exercises also help to relieve pain, numbness, and tingling. Lumbar rotation  1. Lie on your back on a firm surface and bend your knees. 2. Straighten your arms out to your sides so each arm forms a 90-degree angle (right angle) with a side of your body. 3. Slowly move (rotate) both of your knees to one side of your body until you feel a stretch in your lower back (lumbar). Try not to let your shoulders lift off the floor. 4. Hold this position for __________ seconds. 5. Tense your abdominal muscles and slowly move your knees back to the starting position. 6. Repeat this exercise on the other side of your body. Repeat __________ times. Complete this exercise  __________ times a day. Single knee to chest  1. Lie on your back on a firm surface with both legs straight. 2. Bend one of your knees. Use your hands to move your knee up toward your chest until you feel a gentle stretch in your lower back and buttock. ? Hold your leg in this position by holding on to the front of your knee. ? Keep your other leg as straight as possible. 3. Hold this position for __________ seconds. 4. Slowly return to the starting position. 5. Repeat with your other leg. Repeat __________ times. Complete this exercise __________ times a day. Prone extension on elbows  1. Lie on your abdomen on a firm surface (prone position). 2. Prop yourself up on your elbows. 3. Use your arms to help lift your chest up until you feel a gentle stretch in your abdomen and your lower back. ? This will place some of your body weight on your elbows. If this is uncomfortable, try stacking pillows  under your chest. ? Your hips should stay down, against the surface that you are lying on. Keep your hip and back muscles relaxed. 4. Hold this position for __________ seconds. 5. Slowly relax your upper body and return to the starting position. Repeat __________ times. Complete this exercise __________ times a day. Strengthening exercises These exercises build strength and endurance in your back. Endurance is the ability to use your muscles for a long time, even after they get tired. Pelvic tilt This exercise strengthens the muscles that lie deep in the abdomen. 1. Lie on your back on a firm surface. Bend your knees and keep your feet flat on the floor. 2. Tense your abdominal muscles. Tip your pelvis up toward the ceiling and flatten your lower back into the floor. ? To help with this exercise, you may place a small towel under your lower back and try to push your back into the towel. 3. Hold this position for __________ seconds. 4. Let your muscles relax completely before you repeat this exercise. Repeat __________ times. Complete this exercise __________ times a day. Alternating arm and leg raises  1. Get on your hands and knees on a firm surface. If you are on a hard floor, you may want to use padding, such as an exercise mat, to cushion your knees. 2. Line up your arms and legs. Your hands should be directly below your shoulders, and your knees should be directly below your hips. 3. Lift your left leg behind you. At the same time, raise your right arm and straighten it in front of you. ? Do not lift your leg higher than your hip. ? Do not lift your arm higher than your shoulder. ? Keep your abdominal and back muscles tight. ? Keep your hips facing the ground. ? Do not arch your back. ? Keep your balance carefully, and do not hold your breath. 4. Hold this position for __________ seconds. 5. Slowly return to the starting position. 6. Repeat with your right leg and your left  arm. Repeat __________ times. Complete this exercise __________ times a day. Abdominal set with straight leg raise  1. Lie on your back on a firm surface. 2. Bend one of your knees and keep your other leg straight. 3. Tense your abdominal muscles and lift your straight leg up, 4-6 inches (10-15 cm) off the ground. 4. Keep your abdominal muscles tight and hold this position for __________ seconds. ? Do not hold your breath. ? Do not arch  your back. Keep it flat against the ground. 5. Keep your abdominal muscles tense as you slowly lower your leg back to the starting position. 6. Repeat with your other leg. Repeat __________ times. Complete this exercise __________ times a day. Single leg lower with bent knees 1. Lie on your back on a firm surface. 2. Tense your abdominal muscles and lift your feet off the floor, one foot at a time, so your knees and hips are bent in 90-degree angles (right angles). ? Your knees should be over your hips and your lower legs should be parallel to the floor. 3. Keeping your abdominal muscles tense and your knee bent, slowly lower one of your legs so your toe touches the ground. 4. Lift your leg back up to return to the starting position. ? Do not hold your breath. ? Do not let your back arch. Keep your back flat against the ground. 5. Repeat with your other leg. Repeat __________ times. Complete this exercise __________ times a day. Posture and body mechanics Good posture and healthy body mechanics can help to relieve stress in your body's tissues and joints. Body mechanics refers to the movements and positions of your body while you do your daily activities. Posture is part of body mechanics. Good posture means:  Your spine is in its natural S-curve position (neutral).  Your shoulders are pulled back slightly.  Your head is not tipped forward. Follow these guidelines to improve your posture and body mechanics in your everyday  activities. Standing   When standing, keep your spine neutral and your feet about hip width apart. Keep a slight bend in your knees. Your ears, shoulders, and hips should line up.  When you do a task in which you stand in one place for a long time, place one foot up on a stable object that is 2-4 inches (5-10 cm) high, such as a footstool. This helps keep your spine neutral. Sitting   When sitting, keep your spine neutral and keep your feet flat on the floor. Use a footrest, if necessary, and keep your thighs parallel to the floor. Avoid rounding your shoulders, and avoid tilting your head forward.  When working at a desk or a computer, keep your desk at a height where your hands are slightly lower than your elbows. Slide your chair under your desk so you are close enough to maintain good posture.  When working at a computer, place your monitor at a height where you are looking straight ahead and you do not have to tilt your head forward or downward to look at the screen. Resting  When lying down and resting, avoid positions that are most painful for you.  If you have pain with activities such as sitting, bending, stooping, or squatting, lie in a position in which your body does not bend very much. For example, avoid curling up on your side with your arms and knees near your chest (fetal position).  If you have pain with activities such as standing for a long time or reaching with your arms, lie with your spine in a neutral position and bend your knees slightly. Try the following positions: ? Lying on your side with a pillow between your knees. ? Lying on your back with a pillow under your knees. Lifting   When lifting objects, keep your feet at least shoulder width apart and tighten your abdominal muscles.  Bend your knees and hips and keep your spine neutral. It is important to lift using  the strength of your legs, not your back. Do not lock your knees straight out.  Always ask for  help to lift heavy or awkward objects. This information is not intended to replace advice given to you by your health care provider. Make sure you discuss any questions you have with your health care provider. Document Revised: 09/14/2018 Document Reviewed: 06/14/2018 Elsevier Patient Education  [place].

## 2020-05-18 NOTE — Progress Notes (Signed)
Established patient visit   Patient: [name]   DOB: April 13, 1956   64 y.o. Female  MRN: [id number] Visit Date: 05/18/2020  Today's healthcare provider: Mar Daring, PA-C   Chief Complaint  Patient presents with  . Back Pain   Subjective    Back Pain This is a new problem. The current episode started 1 to 4 weeks ago. The problem is unchanged. The pain is present in the lumbar spine. The quality of the pain is described as stabbing and burning. The pain does not radiate. The symptoms are aggravated by position and twisting. Pertinent negatives include no bladder incontinence, bowel incontinence, leg pain, numbness, tingling or weakness. She has tried NSAIDs for the symptoms. The treatment provided mild relief.     Patient Active Problem List   Diagnosis Date Noted  . Abortion, spontaneous 01/28/2015  . Allergic rhinitis 01/28/2015  . Angular blepharoconjunctivitis 01/28/2015  . Asthma 01/28/2015  . History of cervical cancer 01/28/2015  . Chronic kidney disease (CKD), stage III (moderate) ([place]) 01/28/2015  . Dermatitis, eczematoid 01/28/2015  . Edema 01/28/2015  . Esophagitis, reflux 01/28/2015  . Exposure to viral hepatitis 01/28/2015  . Closed fracture of foot 01/28/2015  . Hiatal hernia 01/28/2015  . Decreased potassium in the blood 01/28/2015  . Adult hypothyroidism 01/28/2015  . Nonspecific ST-T changes 01/28/2015  . Obstructive sleep apnea 01/28/2015  . Borderline diabetes 01/28/2015  . Hypercholesterolemia without hypertriglyceridemia 01/28/2015  . Thyroid nodule 01/28/2015  . Vitamin D deficiency 01/28/2015  . SHORTNESS OF BREATH 02/17/2010   Past Medical History:  Diagnosis Date  . Broken arm   . Cervical cancer ([place])   . Chronic kidney disease, stage III (moderate) ([facility])   . Foot fracture 2013  . Hiatal hernia   . Hypopotassemia   . OSA on CPAP   . Other abnormal glucose   . Pure hypercholesterolemia   . Reflux esophagitis   . Sleep  apnea   . Thyroid fullness   . Thyroid nodule   . Unspecified vitamin D deficiency    Social History   Tobacco Use  . Smoking status: Never Smoker  . Smokeless tobacco: Never Used  Vaping Use  . Vaping Use: Never used  Substance Use Topics  . Alcohol use: Yes    Alcohol/week: 0.0 - 1.0 standard drinks  . Drug use: No   Allergies  Allergen Reactions  . Penicillins     Swelling  Hives      Medications: Outpatient Medications Prior to Visit  Medication Sig  . albuterol (VENTOLIN HFA) 108 (90 Base) MCG/ACT inhaler Inhale 2 puffs into the lungs every 6 (six) hours as needed.  Marland Kitchen allopurinol (ZYLOPRIM) 100 MG tablet TAKE ONE TABLET EVERY DAY  . clobetasol (TEMOVATE) 0.05 % external solution Apply 1 application topically as needed. Due to CPAP use  . fluticasone (FLONASE) 50 MCG/ACT nasal spray USE 2 SPRAYS INTO EACH NOSTRIL DAILY  . furosemide (LASIX) 40 MG tablet Take 1 tablet (40 mg total) by mouth 2 (two) times daily. (Patient taking differently: Take 40 mg by mouth daily.)  . hydrOXYzine (ATARAX/VISTARIL) 25 MG tablet Take 1 tablet (25 mg total) by mouth 3 (three) times daily as needed.  Marland Kitchen levothyroxine (SYNTHROID) 50 MCG tablet TAKE ONE TABLET EACH MORNING BEFORE BREAKFAST  . metolazone (ZAROXOLYN) 5 MG tablet Take 1 tablet (5 mg total) by mouth daily as needed.  . montelukast (SINGULAIR) 10 MG tablet TAKE 1 TABLET BY MOUTH AT BEDTIME  .  NON FORMULARY CPAP nightly.  Marland Kitchen olopatadine (PATANOL) 0.1 % ophthalmic solution Place 1 drop into both eyes daily. As needed  . Omega-3 Fatty Acids (FISH OIL) 1200 MG CAPS Take 2 capsules twice a day.  . potassium chloride (KLOR-CON) 10 MEQ tablet TAKE TWO TABLETS TWICE DAILY (Patient taking differently: 20 mEq daily.)  . Vitamin D, Ergocalciferol, (DRISDOL) 1.25 MG (50000 UNIT) CAPS capsule TAKE ONE CAPSULE BY MOUTH ONCE A WEEK  . potassium chloride (KLOR-CON) 10 MEQ tablet TAKE TWO TABLETS TWICE DAILY   No facility-administered medications  prior to visit.    Review of Systems  Constitutional: Negative.   Gastrointestinal: Negative.  Negative for bowel incontinence.  Genitourinary: Negative for bladder incontinence.  Musculoskeletal: Positive for back pain. Negative for arthralgias, gait problem, joint swelling, myalgias, neck pain and neck stiffness.  Neurological: Negative for tingling, weakness and numbness.      Objective    BP 137/76 (BP Location: Left Arm, Patient Position: Sitting, Cuff Size: Large)   Pulse (!) 57   Temp 98.2 F (36.8 C) (Oral)   Wt 238 lb (108 kg)   BMI 36.19 kg/m    Physical Exam Vitals reviewed.  Constitutional:      General: She is not in acute distress.    Appearance: Normal appearance. She is well-developed and well-nourished. She is not ill-appearing.  HENT:     Head: Normocephalic and atraumatic.  Eyes:     Extraocular Movements: EOM normal.  Pulmonary:     Effort: Pulmonary effort is normal. No respiratory distress.  Musculoskeletal:     Cervical back: Normal range of motion and neck supple.  Neurological:     Mental Status: She is alert.  Psychiatric:        Mood and Affect: Mood and affect and mood normal.        Behavior: Behavior normal.        Thought Content: Thought content normal.        Judgment: Judgment normal.      No results found for any visits on 05/18/20.  Assessment & Plan     1. Strain of lumbar region, initial encounter Suspect lumbar strain of left lateral low back. Will treat with ibuprofen (alternate with tylenol due to CKD). Add flexeril as below. Heating pad. Stretches and exercises provided to patient. Call if worsening or changes.  - ibuprofen (ADVIL) 600 MG tablet; Take 1 tablet (600 mg total) by mouth every 8 (eight) hours as needed.  Dispense: 30 tablet; Refill: 1 - cyclobenzaprine (FLEXERIL) 5 MG tablet; Take 1 tablet (5 mg total) by mouth 3 (three) times daily as needed for muscle spasms.  Dispense: 30 tablet; Refill: 1   No  follow-ups on file.      Reynolds Bowl, PA-C, have reviewed all documentation for this visit. The documentation on 05/18/20 for the exam, diagnosis, procedures, and orders are all accurate and complete.   Rubye Beach  [facility] [phone] (phone) [phone] (fax)  [place]

## 2020-05-28 DIAGNOSIS — G4733 Obstructive sleep apnea (adult) (pediatric): Secondary | ICD-10-CM | POA: Diagnosis not present

## 2020-06-02 ENCOUNTER — Other Ambulatory Visit: Payer: Self-pay | Admitting: Nurse Practitioner

## 2020-06-02 ENCOUNTER — Other Ambulatory Visit: Payer: Self-pay | Admitting: Physician Assistant

## 2020-06-02 DIAGNOSIS — S39012A Strain of muscle, fascia and tendon of lower back, initial encounter: Secondary | ICD-10-CM

## 2020-06-02 DIAGNOSIS — E559 Vitamin D deficiency, unspecified: Secondary | ICD-10-CM

## 2020-06-02 DIAGNOSIS — M1A09X0 Idiopathic chronic gout, multiple sites, without tophus (tophi): Secondary | ICD-10-CM

## 2020-06-02 NOTE — Telephone Encounter (Signed)
Requested medication (s) are due for refill today: yes  Requested medication (s) are on the active medication list: yes  Last refill: 02/21/2020  Future visit scheduled:yes  Notes to clinic: this refill cannot be delegated    Requested Prescriptions  Pending Prescriptions Disp Refills   Vitamin D, Ergocalciferol, (DRISDOL) 1.25 MG (50000 UNIT) CAPS capsule [Pharmacy Med Name: VITAMIN D (ERGOCALCIFEROL) 1.25 MG] 12 capsule 2    Sig: TAKE ONE CAPSULE BY MOUTH ONCE A WEEK      Endocrinology:  Vitamins - Vitamin D Supplementation Failed - 06/02/2020 12:18 PM      Failed - 50,000 IU strengths are not delegated      Failed - Phosphate in normal range and within 360 days    No results found for: PHOS        Failed - Vitamin D in normal range and within 360 days    Vit D, 25-Hydroxy  Date Value Ref Range Status  12/10/2018 33.9 30.0 - 100.0 ng/mL Final    Comment:    Vitamin D deficiency has been defined by the Institute of Medicine and an Endocrine Society practice guideline as a level of serum 25-OH vitamin D less than 20 ng/mL (1,2). The Endocrine Society went on to further define vitamin D insufficiency as a level between 21 and 29 ng/mL (2). 1. IOM (Institute of Medicine). 2010. Dietary reference    intakes for calcium and D. [place]: The    [employer]. 2. Holick MF, Binkley [place], Bischoff-Ferrari HA, et al.    Evaluation, treatment, and prevention of vitamin D    deficiency: an Endocrine Society clinical practice    guideline. JCEM. 2011 Jul; 96(7):1911-30.           Passed - Ca in normal range and within 360 days    Calcium  Date Value Ref Range Status  09/30/2019 9.5 8.7 - 10.3 mg/dL Final          Passed - Valid encounter within last 12 months    Recent Outpatient Visits           2 weeks ago Strain of lumbar region, initial encounter   [facility] [place], [place], [place]   2 months ago Bacterial conjunctivitis    [facility] [place], Alessandra Bevels, [place]   8 months ago Annual physical exam   [facility] [place], Alessandra Bevel[place], [place]   1 year ago [facility] to [employer] preventive visit   [facility], [place], [place]   3 years ago Sprain of anterior talofibular ligament of right ankle, initial encounter   [facility] [place], Al[place]andra Be[place]s, PA-C       Future Appointments             In 4 months Burnette, Alessandra Bevels, PA-C [facility], [facility]               cyclobenzaprine (FLEXERIL) 5 MG tablet [employer] Med Name: CYCLOBENZAPRINE HCL 5 MG TAB] 30 tablet 1    Sig: TAKE 1 TABLET BY MOUTH 3 TIMES DAILY AS MNEEDED FOR MUSCLE SPASMS      Not Delegated - Analgesics:  Muscle Relaxants Failed - 06/02/2020 12:18 PM      Failed - This refill cannot be delegated      Passed - Valid encounter within last 6 months    Recent Outpatient Visits           2 weeks ago Strain of lumbar region,  initial encounter   [facility] [place], [place], [place]   2 months ago Bacterial conjunctivitis   [facility] Lo[place], [place]   8 months ago Annual physical exam   [facility] Joycelyn Man [place], [place]   1 year ago Welcome to [employer] preventive visit   [facility] [place], [place], [place]   3 years ago Sprain of anterior talofibular ligament of right ankle, initial encounter   [facility] [place], Alessandra Bevels, [place]       Future Appointments             In 4 months Burnette, Alessandra Bevels, PA-C [facility], [facility]               IBU 600 MG tablet [employer] Med Name: IBUPROFEN 600 MG TAB] 30 tablet 1    Sig: TAKE 1 TABLET BY BY MOUTH EVERY 8 HOURS AS NEEDED      Analgesics:  NSAIDS Failed - 06/02/2020 12:18 PM      Failed - Cr in normal range and within 360 days    Creatinine, Ser  Date Value Ref Range Status  09/30/2019 1.23  (H) 0.57 - 1.00 mg/dL Final          Passed - HGB in normal range and within 360 days    Hemoglobin  Date Value Ref Range Status  09/30/2019 13.1 11.1 - 15.9 g/dL Final          Passed - Patient is not pregnant      Passed - Valid encounter within last 12 months    Recent Outpatient Visits           2 weeks ago Strain of lumbar region, initial encounter   [facility] [place], [place], [place]   2 months ago Bacterial conjunctivitis   [facility] [place], Alessandra Bevels, [place]   8 months ago Annual physical exam   [facility] [place], Alessandra Bevels, [place]   1 year ago Welcome to [employer] preventive visit   [facility] [place], [place], [place]   3 years ago Sprain of anterior talofibular ligament of right ankle, initial encounter   [facility] [place], Alessandra Bevels, [place]       Future Appointments             In 4 months Burnette, Alessandra [place]s, PA-C [facility]

## 2020-07-14 ENCOUNTER — Other Ambulatory Visit: Payer: Self-pay | Admitting: Physician Assistant

## 2020-07-14 DIAGNOSIS — H5789 Other specified disorders of eye and adnexa: Secondary | ICD-10-CM

## 2020-07-15 NOTE — Telephone Encounter (Signed)
Requested Prescriptions  Pending Prescriptions Disp Refills  . hydrOXYzine (ATARAX/VISTARIL) 25 MG tablet [Pharmacy Med Name: HYDROXYZINE HCL 25 MG TAB] 30 tablet 0    Sig: TAKE 1 TABLET BY MOUTH THREE TIMES DAILY AS NEEDED     Ear, Nose, and Throat:  Antihistamines Passed - 07/14/2020  4:38 PM      Passed - Valid encounter within last 12 months    Recent Outpatient Visits          1 month ago Strain of lumbar region, initial encounter   [place], [place]   3 months ago Bacterial conjunctivitis   [facility] [place], [place]   9 months ago Annual physical exam   [place], Clearnce Sorrel, [place]   1 year ago Welcome to [employer] preventive visit   [place], [place], [place]   3 years ago Sprain of anterior talofibular ligament of right ankle, initial encounter   [place], PA-C      Future Appointments            In 1 month Burnette, Clearnce Sorrel, PA-C [employer], [place]   In 2 months [place], Kelby Aline, [place], [place]

## 2020-08-27 DIAGNOSIS — G4733 Obstructive sleep apnea (adult) (pediatric): Secondary | ICD-10-CM | POA: Diagnosis not present

## 2020-09-07 ENCOUNTER — Other Ambulatory Visit: Payer: Self-pay

## 2020-09-07 ENCOUNTER — Encounter: Payer: Self-pay | Admitting: Physician Assistant

## 2020-09-07 ENCOUNTER — Ambulatory Visit (INDEPENDENT_AMBULATORY_CARE_PROVIDER_SITE_OTHER): Payer: PPO | Admitting: Physician Assistant

## 2020-09-07 VITALS — BP 140/80 | HR 63 | Temp 98.5°F | Resp 16 | Wt 240.0 lb

## 2020-09-07 DIAGNOSIS — M1A09X0 Idiopathic chronic gout, multiple sites, without tophus (tophi): Secondary | ICD-10-CM | POA: Diagnosis not present

## 2020-09-07 DIAGNOSIS — N1831 Chronic kidney disease, stage 3a: Secondary | ICD-10-CM

## 2020-09-07 DIAGNOSIS — S39012A Strain of muscle, fascia and tendon of lower back, initial encounter: Secondary | ICD-10-CM | POA: Diagnosis not present

## 2020-09-07 DIAGNOSIS — J4520 Mild intermittent asthma, uncomplicated: Secondary | ICD-10-CM | POA: Diagnosis not present

## 2020-09-07 DIAGNOSIS — Z1152 Encounter for screening for COVID-19: Secondary | ICD-10-CM

## 2020-09-07 DIAGNOSIS — J301 Allergic rhinitis due to pollen: Secondary | ICD-10-CM

## 2020-09-07 DIAGNOSIS — Z6836 Body mass index (BMI) 36.0-36.9, adult: Secondary | ICD-10-CM | POA: Diagnosis not present

## 2020-09-07 DIAGNOSIS — Z1239 Encounter for other screening for malignant neoplasm of breast: Secondary | ICD-10-CM

## 2020-09-07 DIAGNOSIS — T7840XA Allergy, unspecified, initial encounter: Secondary | ICD-10-CM | POA: Diagnosis not present

## 2020-09-07 DIAGNOSIS — E559 Vitamin D deficiency, unspecified: Secondary | ICD-10-CM

## 2020-09-07 DIAGNOSIS — H5789 Other specified disorders of eye and adnexa: Secondary | ICD-10-CM | POA: Diagnosis not present

## 2020-09-07 DIAGNOSIS — Z8249 Family history of ischemic heart disease and other diseases of the circulatory system: Secondary | ICD-10-CM

## 2020-09-07 DIAGNOSIS — E039 Hypothyroidism, unspecified: Secondary | ICD-10-CM | POA: Diagnosis not present

## 2020-09-07 DIAGNOSIS — R9431 Abnormal electrocardiogram [ECG] [EKG]: Secondary | ICD-10-CM

## 2020-09-07 MED ORDER — MONTELUKAST SODIUM 10 MG PO TABS: 1.0000 | ORAL_TABLET | Freq: Every day | ORAL | 3 refills | Status: DC

## 2020-09-07 MED ORDER — IBUPROFEN 600 MG PO TABS: ORAL_TABLET | ORAL | 1 refills | Status: DC

## 2020-09-07 MED ORDER — ALLOPURINOL 100 MG PO TABS: 100.0000 mg | ORAL_TABLET | Freq: Every day | ORAL | 3 refills | Status: DC

## 2020-09-07 MED ORDER — ALBUTEROL SULFATE HFA 108 (90 BASE) MCG/ACT IN AERS: 2.0000 | INHALATION_SPRAY | Freq: Four times a day (QID) | RESPIRATORY_TRACT | 1 refills | Status: DC | PRN

## 2020-09-07 MED ORDER — VITAMIN D (ERGOCALCIFEROL) 1.25 MG (50000 UNIT) PO CAPS: 1.0000 | ORAL_CAPSULE | ORAL | 3 refills | Status: DC

## 2020-09-07 MED ORDER — POTASSIUM CHLORIDE CRYS ER 10 MEQ PO TBCR: EXTENDED_RELEASE_TABLET | ORAL | 5 refills | Status: DC

## 2020-09-07 MED ORDER — HYDROXYZINE HCL 25 MG PO TABS: 25.0000 mg | ORAL_TABLET | Freq: Three times a day (TID) | ORAL | 3 refills | Status: DC | PRN

## 2020-09-07 MED ORDER — OLOPATADINE HCL 0.1 % OP SOLN: 1.0000 [drp] | Freq: Every day | OPHTHALMIC | 5 refills | Status: DC

## 2020-09-07 MED ORDER — FLUTICASONE PROPIONATE 50 MCG/ACT NA SUSP: NASAL | 1 refills | Status: DC

## 2020-09-07 MED ORDER — METOLAZONE 5 MG PO TABS: 5.0000 mg | ORAL_TABLET | Freq: Every day | ORAL | 3 refills | Status: DC | PRN

## 2020-09-07 MED ORDER — CYCLOBENZAPRINE HCL 5 MG PO TABS: ORAL_TABLET | ORAL | 3 refills | Status: DC

## 2020-09-07 MED ORDER — FUROSEMIDE 40 MG PO TABS: 40.0000 mg | ORAL_TABLET | Freq: Two times a day (BID) | ORAL | 1 refills | Status: DC

## 2020-09-07 MED ORDER — LEVOTHYROXINE SODIUM 50 MCG PO TABS: ORAL_TABLET | ORAL | 3 refills | Status: DC

## 2020-09-07 NOTE — Patient Instructions (Signed)
[facility] [place],  [place]  [id number] [address]: [phone] Mammogram due in Aug 2022   Preventive Care 63 Years and Older, Female Preventive care refers to lifestyle choices and visits with your health care provider that can promote health and wellness. This includes:  A yearly physical exam. This is also called an annual wellness visit.  Regular dental and eye exams.  Immunizations.  Screening for certain conditions.  Healthy lifestyle choices, such as: ? Eating a healthy diet. ? Getting regular exercise. ? Not using drugs or products that contain nicotine and tobacco. ? Limiting alcohol use. What can I expect for my preventive care visit? Physical exam Your health care provider will check your:  Height and weight. These may be used to calculate your BMI (body mass index). BMI is a measurement that tells if you are at a healthy weight.  Heart rate and blood pressure.  Body temperature.  Skin for abnormal spots. Counseling Your health care provider may ask you questions about your:  Past medical problems.  Family's medical history.  Alcohol, tobacco, and drug use.  Emotional well-being.  Home life and relationship well-being.  Sexual activity.  Diet, exercise, and sleep habits.  History of falls.  Memory and ability to understand (cognition).  Work and work Statistician.  Pregnancy and menstrual history.  Access to firearms. What immunizations do I need? Vaccines are usually given at various ages, according to a schedule. Your health care provider will recommend vaccines for you based on your age, medical history, and lifestyle or other factors, such as travel or where you work.   What tests do I need? Blood tests  Lipid and cholesterol levels. These may be checked every 5 years, or more often depending on your overall health.  Hepatitis C test.  Hepatitis B test. Screening  Lung cancer  screening. You may have this screening every year starting at age 87 if you have a 30-pack-year history of smoking and currently smoke or have quit within the past 15 years.  Colorectal cancer screening. ? All adults should have this screening starting at age 46 and continuing until age 64. ? Your health care provider may recommend screening at age 29 if you are at increased risk. ? You will have tests every 1-10 years, depending on your results and the type of screening test.  Diabetes screening. ? This is done by checking your blood sugar (glucose) after you have not eaten for a while (fasting). ? You may have this done every 1-3 years.  Mammogram. ? This may be done every 1-2 years. ? Talk with your health care provider about how often you should have regular mammograms.  Abdominal aortic aneurysm (AAA) screening. You may need this if you are a current or former smoker.  BRCA-related cancer screening. This may be done if you have a family history of breast, ovarian, tubal, or peritoneal cancers. Other tests  STD (sexually transmitted disease) testing, if you are at risk.  Bone density scan. This is done to screen for osteoporosis. You may have this done starting at age 53. Talk with your health care provider about your test results, treatment options, and if necessary, the need for more tests. Follow these instructions at home: Eating and drinking  Eat a diet that includes fresh fruits and vegetables, whole grains, lean protein, and low-fat dairy products. Limit your intake of foods with high amounts of sugar, saturated fats, and salt.  Take vitamin and  mineral supplements as recommended by your health care provider.  Do not drink alcohol if your health care provider tells you not to drink.  If you drink alcohol: ? Limit how much you have to 0-1 drink a day. ? Be aware of how much alcohol is in your drink. In the [place] one drink equals one 12 oz bottle of beer (355 mL), one 5 oz  glass of wine (148 mL), or one 1 oz glass of hard liquor (44 mL).   Lifestyle  Take daily care of your teeth and gums. Brush your teeth every morning and night with fluoride toothpaste. Floss one time each day.  Stay active. Exercise for at least 30 minutes 5 or more days each week.  Do not use any products that contain nicotine or tobacco, such as cigarettes, e-cigarettes, and chewing tobacco. If you need help quitting, ask your health care provider.  Do not use drugs.  If you are sexually active, practice safe sex. Use a condom or other form of protection in order to prevent STIs (sexually transmitted infections).  Talk with your health care provider about taking a low-dose aspirin or statin.  Find healthy ways to cope with stress, such as: ? Meditation, yoga, or listening to music. ? Journaling. ? Talking to a trusted person. ? Spending time with friends and family. Safety  Always wear your seat belt while driving or riding in a vehicle.  Do not drive: ? If you have been drinking alcohol. Do not ride with someone who has been drinking. ? When you are tired or distracted. ? While texting.  Wear a helmet and other protective equipment during sports activities.  If you have firearms in your house, make sure you follow all gun safety procedures. What's next?  Visit your health care provider once a year for an annual wellness visit.  Ask your health care provider how often you should have your eyes and teeth checked.  Stay up to date on all vaccines. This information is not intended to replace advice given to you by your health care provider. Make sure you discuss any questions you have with your health care provider. Document Revised: 05/13/2020 Document Reviewed: 05/17/2018 Elsevier Patient Education  2021 [employer].   

## 2020-09-07 NOTE — Progress Notes (Signed)
I,April Miller,acting as a scribe for [employer], PA-C.,have documented all relevant documentation on the behalf of Mar Daring, PA-C,as directed by  Mar Daring, PA-C while in the presence of Mar Daring, PA-C.   Established patient visit   Patient: [name]   DOB: 1955-11-29   65 y.o. Female  MRN: [id number] Visit Date: 09/07/2020  Today's healthcare provider: Mar Daring, PA-C   Chief Complaint  Patient presents with  . Follow-up  . Hyperlipidemia  . Hypothyroidism  . Prediabetes   Subjective    HPI  Lipid/Cholesterol, follow-up  Last Lipid Panel: Lab Results  Component Value Date   CHOL 186 09/30/2019   LDLCALC 104 (H) 09/30/2019   HDL 65 09/30/2019   TRIG 95 09/30/2019    She was last seen for this 10 months ago.  Management since that visit includes; labs checked showing-normal.  She reports good compliance with treatment. She is not having side effects. none She is following a Regular diet. Current exercise: none  Last metabolic panel Lab Results  Component Value Date   GLUCOSE 89 09/30/2019   NA 140 09/30/2019   K 3.8 09/30/2019   BUN 16 09/30/2019   CREATININE 1.23 (H) 09/30/2019   GFRNONAA 46 (L) 09/30/2019   GFRAA 53 (L) 09/30/2019   CALCIUM 9.5 09/30/2019   AST 15 12/10/2018   ALT 13 12/10/2018   The 10-year ASCVD risk score Mikey Bussing DC Jr., et al., 2013) is: 7.5%  --------------------------------------------------------------------  Prediabetes, Follow-up  Lab Results  Component Value Date   HGBA1C 6.2 (H) 09/30/2019   HGBA1C 6.3 (H) 12/10/2018   HGBA1C 5.8 (H) 08/04/2016   GLUCOSE 89 09/30/2019   GLUCOSE 87 12/10/2018   GLUCOSE 98 08/04/2016    Last seen for for this10 months ago.  Management since that visit includes; Diet controlled. Will check labs as below and f/u pending results. Current symptoms include none and have been unchanged.  Prior visit with dietician: no Current  diet: in general, a "healthy" diet   Current exercise: none  Pertinent Labs:    Component Value Date/Time   CHOL 186 09/30/2019 1134   TRIG 95 09/30/2019 1134   CHOLHDL 2.9 09/30/2019 1134   CREATININE 1.23 (H) 09/30/2019 1134    Wt Readings from Last 3 Encounters:  09/07/20 240 lb (108.9 kg)  05/18/20 238 lb (108 kg)  03/18/20 238 lb 12.8 oz (108.3 kg)    --------------------------------------------------------------------  Hypothyroid, follow-up  Lab Results  Component Value Date   TSH 3.910 09/30/2019   TSH 3.280 12/10/2018   TSH 4.680 (H) 08/04/2016   T4TOTAL 6.9 12/10/2018   Wt Readings from Last 3 Encounters:  09/07/20 240 lb (108.9 kg)  05/18/20 238 lb (108 kg)  03/18/20 238 lb 12.8 oz (108.3 kg)    She was last seen for hypothyroid 10 months ago.  Management since that visit includes; labs checked showing-normal. She reports good compliance with treatment. She is not having side effects. none --------------------------------------------------------------------   Patient Active Problem List   Diagnosis Date Noted  . Abortion, spontaneous 01/28/2015  . Allergic rhinitis 01/28/2015  . Angular blepharoconjunctivitis 01/28/2015  . Asthma 01/28/2015  . History of cervical cancer 01/28/2015  . Chronic kidney disease (CKD), stage III (moderate) ([place]) 01/28/2015  . Dermatitis, eczematoid 01/28/2015  . Edema 01/28/2015  . Esophagitis, reflux 01/28/2015  . Exposure to viral hepatitis 01/28/2015  . Closed fracture of foot 01/28/2015  . Hiatal hernia 01/28/2015  .  Decreased potassium in the blood 01/28/2015  . Adult hypothyroidism 01/28/2015  . Nonspecific ST-T changes 01/28/2015  . Obstructive sleep apnea 01/28/2015  . Borderline diabetes 01/28/2015  . Hypercholesterolemia without hypertriglyceridemia 01/28/2015  . Thyroid nodule 01/28/2015  . Vitamin D deficiency 01/28/2015  . SHORTNESS OF BREATH 02/17/2010   Past Medical History:  Diagnosis Date  .  Broken arm   . Cervical cancer ([place])   . Chronic kidney disease, stage III (moderate) (HCC)   . Foot fracture 2013  . Hiatal hernia   . Hypopotassemia   . OSA on CPAP   . Other abnormal glucose   . Pure hypercholesterolemia   . Reflux esophagitis   . Sleep apnea   . Thyroid fullness   . Thyroid nodule   . Unspecified vitamin D deficiency        Medications: Outpatient Medications Prior to Visit  Medication Sig  . albuterol (VENTOLIN HFA) 108 (90 Base) MCG/ACT inhaler Inhale 2 puffs into the lungs every 6 (six) hours as needed.  Marland Kitchen allopurinol (ZYLOPRIM) 100 MG tablet TAKE ONE TABLET EVERY DAY  . cyclobenzaprine (FLEXERIL) 5 MG tablet TAKE 1 TABLET BY MOUTH 3 TIMES DAILY AS MNEEDED FOR MUSCLE SPASMS  . fluticasone (FLONASE) 50 MCG/ACT nasal spray USE 2 SPRAYS INTO EACH NOSTRIL DAILY  . furosemide (LASIX) 40 MG tablet Take 1 tablet (40 mg total) by mouth 2 (two) times daily. (Patient taking differently: Take 40 mg by mouth daily.)  . hydrOXYzine (ATARAX/VISTARIL) 25 MG tablet TAKE 1 TABLET BY MOUTH THREE TIMES DAILY AS NEEDED  . IBU 600 MG tablet TAKE 1 TABLET BY BY MOUTH EVERY 8 HOURS AS NEEDED  . levothyroxine (SYNTHROID) 50 MCG tablet TAKE ONE TABLET EACH MORNING BEFORE BREAKFAST  . metolazone (ZAROXOLYN) 5 MG tablet Take 1 tablet (5 mg total) by mouth daily as needed.  . montelukast (SINGULAIR) 10 MG tablet TAKE 1 TABLET BY MOUTH AT BEDTIME  . NON FORMULARY CPAP nightly.  Marland Kitchen olopatadine (PATANOL) 0.1 % ophthalmic solution Place 1 drop into both eyes daily. As needed  . Omega-3 Fatty Acids (FISH OIL) 1200 MG CAPS Take 2 capsules twice a day.  . potassium chloride (KLOR-CON) 10 MEQ tablet TAKE TWO TABLETS TWICE DAILY (Patient taking differently: 20 mEq daily.)  . Vitamin D, Ergocalciferol, (DRISDOL) 1.25 MG (50000 UNIT) CAPS capsule TAKE ONE CAPSULE BY MOUTH ONCE A WEEK  . clobetasol (TEMOVATE) 0.05 % external solution Apply 1 application topically as needed. Due to CPAP use (Patient  not taking: Reported on 09/07/2020)   No facility-administered medications prior to visit.    Review of Systems  Constitutional: Negative for appetite change, chills, fatigue and fever.  Respiratory: Negative for chest tightness and shortness of breath.   Cardiovascular: Negative for chest pain and palpitations.  Gastrointestinal: Negative for abdominal pain, nausea and vomiting.  Neurological: Negative for dizziness and weakness.    Last CBC Lab Results  Component Value Date   WBC 5.9 09/30/2019   HGB 13.1 09/30/2019   HCT 41.1 09/30/2019   MCV 85 09/30/2019   MCH 27.1 09/30/2019   RDW 14.1 09/30/2019   PLT 215 12/87/8676   Last metabolic panel Lab Results  Component Value Date   GLUCOSE 89 09/30/2019   NA 140 09/30/2019   K 3.8 09/30/2019   CL 101 09/30/2019   CO2 26 09/30/2019   BUN 16 09/30/2019   CREATININE 1.23 (H) 09/30/2019   GFRNONAA 46 (L) 09/30/2019   GFRAA 53 (L) 09/30/2019  CALCIUM 9.5 09/30/2019   PROT 6.2 12/10/2018   ALBUMIN 4.3 12/10/2018   LABGLOB 1.9 12/10/2018   AGRATIO 2.3 (H) 12/10/2018   BILITOT 0.4 12/10/2018   ALKPHOS 108 12/10/2018   AST 15 12/10/2018   ALT 13 12/10/2018       Objective    BP 140/80 (BP Location: Left Arm, Patient Position: Sitting, Cuff Size: Large)   Pulse 63   Temp 98.5 F (36.9 C) (Oral)   Resp 16   Wt 240 lb (108.9 kg)   SpO2 97%   BMI 36.49 kg/m  BP Readings from Last 3 Encounters:  09/07/20 140/80  05/18/20 137/76  03/18/20 131/69   Wt Readings from Last 3 Encounters:  09/07/20 240 lb (108.9 kg)  05/18/20 238 lb (108 kg)  03/18/20 238 lb 12.8 oz (108.3 kg)       Physical Exam Vitals reviewed.  Constitutional:      General: She is not in acute distress.    Appearance: Normal appearance. She is well-developed. She is obese. She is not ill-appearing or diaphoretic.  HENT:     Head: Normocephalic and atraumatic.  Cardiovascular:     Rate and Rhythm: Normal rate and regular rhythm.     Heart  sounds: Normal heart sounds. No murmur heard. No friction rub. No gallop.   Pulmonary:     Effort: Pulmonary effort is normal. No respiratory distress.     Breath sounds: Normal breath sounds. No wheezing or rales.  Musculoskeletal:     Cervical back: Normal range of motion and neck supple.  Neurological:     Mental Status: She is alert.      No results found for any visits on 09/07/20.  Assessment & Plan     1. Mild intermittent asthma without complication Stable. Diagnosis pulled for medication refill. Continue current medical treatment plan. - albuterol (VENTOLIN HFA) 108 (90 Base) MCG/ACT inhaler; Inhale 2 puffs into the lungs every 6 (six) hours as needed.  Dispense: 18 g; Refill: 1 - montelukast (SINGULAIR) 10 MG tablet; Take 1 tablet (10 mg total) by mouth at bedtime.  Dispense: 90 tablet; Refill: 3  2. Chronic gout of multiple sites, unspecified cause Stable. Diagnosis pulled for medication refill. Continue current medical treatment plan. - allopurinol (ZYLOPRIM) 100 MG tablet; Take 1 tablet (100 mg total) by mouth daily.  Dispense: 90 tablet; Refill: 3 - CBC w/Diff/Platelet  3. Strain of lumbar region, initial encounter Stable. Diagnosis pulled for medication refill. Continue current medical treatment plan. - cyclobenzaprine (FLEXERIL) 5 MG tablet; TAKE 1 TABLET BY MOUTH 3 TIMES DAILY AS MNEEDED FOR MUSCLE SPASMS  Dispense: 90 tablet; Refill: 3 - ibuprofen (IBU) 600 MG tablet; TAKE 1 TABLET BY BY MOUTH EVERY 8 HOURS AS NEEDED  Dispense: 30 tablet; Refill: 1  4. Seasonal allergic rhinitis due to pollen Stable. Diagnosis pulled for medication refill. Continue current medical treatment plan. - fluticasone (FLONASE) 50 MCG/ACT nasal spray; USE 2 SPRAYS INTO EACH NOSTRIL DAILY  Dispense: 48 g; Refill: 1 - montelukast (SINGULAIR) 10 MG tablet; Take 1 tablet (10 mg total) by mouth at bedtime.  Dispense: 90 tablet; Refill: 3  5. Stage 3a chronic kidney disease (HCC) Stable.  Diagnosis pulled for medication refill. Continue current medical treatment plan. Will check labs as below and f/u pending results. - furosemide (LASIX) 40 MG tablet; Take 1 tablet (40 mg total) by mouth 2 (two) times daily.  Dispense: 180 tablet; Refill: 1 - metolazone (ZAROXOLYN) 5 MG tablet; Take 1  tablet (5 mg total) by mouth daily as needed.  Dispense: 90 tablet; Refill: 3 - CBC w/Diff/Platelet - Comprehensive Metabolic Panel (CMET) - HgB A1c  6. Eye swollen, bilateral Stable. Diagnosis pulled for medication refill. Continue current medical treatment plan. - hydrOXYzine (ATARAX/VISTARIL) 25 MG tablet; Take 1 tablet (25 mg total) by mouth 3 (three) times daily as needed.  Dispense: 90 tablet; Refill: 3  7. Adult hypothyroidism Stable. Diagnosis pulled for medication refill. Continue current medical treatment plan. - levothyroxine (SYNTHROID) 50 MCG tablet; TAKE ONE TABLET EACH MORNING BEFORE BREAKFAST  Dispense: 90 tablet; Refill: 3 - TSH  8. Allergy, initial encounter Stable. Diagnosis pulled for medication refill. Continue current medical treatment plan. - olopatadine (PATANOL) 0.1 % ophthalmic solution; Place 1 drop into both eyes daily. As needed  Dispense: 5 mL; Refill: 5  9. Vitamin D deficiency Stable. Diagnosis pulled for medication refill. Continue current medical treatment plan. Will check labs as below and f/u pending results. - Vitamin D, Ergocalciferol, (DRISDOL) 1.25 MG (50000 UNIT) CAPS capsule; Take 1 capsule (50,000 Units total) by mouth once a week.  Dispense: 12 capsule; Refill: 3 - Vitamin D (25 hydroxy)  10. Class 2 severe obesity due to excess calories with serious comorbidity and body mass index (BMI) of 36.0 to 36.9 in adult [facility]) Counseled patient on healthy lifestyle modifications including dieting and exercise.  Will check labs as below and f/u pending results. - Lipid Panel With LDL/HDL Ratio - HgB A1c  11. Encounter for screening for COVID-19 Will check  labs as below and f/u pending results. - SARS-CoV-2 Semi-Quantitative Total Antibody, Spike  12. Encounter for breast cancer screening using non-mammogram modality There is no family history of breast cancer. She does perform regular self breast exams. Mammogram was ordered as below. Information for [facility] Breast clinic was given to patient so she may schedule her mammogram at her convenience. - MM 3D SCREEN BREAST BILATERAL; Future  13. Abnormal EKG H/O this. Father passed at 57 yr old from [place]. Sees Dr. Rockey Situ every 5 years. Delayed follow up due to covid. New referral placed.  - Ambulatory referral to Cardiology  14. Family history of early CAD See above medical treatment plan. - Ambulatory referral to Cardiology   No follow-ups on file.      Reynolds Bowl, PA-C, have reviewed all documentation for this visit. The documentation on 09/07/20 for the exam, diagnosis, procedures, and orders are all accurate and complete.   Rubye Beach  [facility] [phone] (phone) [phone] (fax)  [place]

## 2020-09-08 LAB — CBC WITH DIFFERENTIAL/PLATELET
Basophils Absolute: 0 10*3/uL (ref 0.0–0.2)
Basos: 1 %
EOS (ABSOLUTE): 0.2 10*3/uL (ref 0.0–0.4)
Eos: 3 %
Hematocrit: 38.1 % (ref 34.0–46.6)
Hemoglobin: 12.5 g/dL (ref 11.1–15.9)
Immature Grans (Abs): 0 10*3/uL (ref 0.0–0.1)
Immature Granulocytes: 0 %
Lymphocytes Absolute: 1.1 10*3/uL (ref 0.7–3.1)
Lymphs: 20 %
MCH: 27.7 pg (ref 26.6–33.0)
MCHC: 32.8 g/dL (ref 31.5–35.7)
MCV: 85 fL (ref 79–97)
Monocytes Absolute: 0.4 10*3/uL (ref 0.1–0.9)
Monocytes: 7 %
Neutrophils Absolute: 3.9 10*3/uL (ref 1.4–7.0)
Neutrophils: 69 %
Platelets: 211 10*3/uL (ref 150–450)
RBC: 4.51 x10E6/uL (ref 3.77–5.28)
RDW: 13.7 % (ref 11.7–15.4)
WBC: 5.7 10*3/uL (ref 3.4–10.8)

## 2020-09-08 LAB — COMPREHENSIVE METABOLIC PANEL
ALT: 21 IU/L (ref 0–32)
AST: 18 IU/L (ref 0–40)
Albumin/Globulin Ratio: 1.6 (ref 1.2–2.2)
Albumin: 4.3 g/dL (ref 3.8–4.8)
Alkaline Phosphatase: 98 IU/L (ref 44–121)
BUN/Creatinine Ratio: 16 (ref 12–28)
BUN: 18 mg/dL (ref 8–27)
Bilirubin Total: 0.5 mg/dL (ref 0.0–1.2)
CO2: 25 mmol/L (ref 20–29)
Calcium: 9.3 mg/dL (ref 8.7–10.3)
Chloride: 104 mmol/L (ref 96–106)
Creatinine, Ser: 1.13 mg/dL — ABNORMAL HIGH (ref 0.57–1.00)
Globulin, Total: 2.7 g/dL (ref 1.5–4.5)
Glucose: 111 mg/dL — ABNORMAL HIGH (ref 65–99)
Potassium: 4.5 mmol/L (ref 3.5–5.2)
Sodium: 143 mmol/L (ref 134–144)
Total Protein: 7 g/dL (ref 6.0–8.5)
eGFR: 53 mL/min/{1.73_m2} — ABNORMAL LOW (ref >59)

## 2020-09-08 LAB — SARS-COV-2 SPIKE AB DILUTION: SARS-CoV-2 Spike Ab Dilution: >25000 U/mL (ref <0.8)

## 2020-09-08 LAB — VITAMIN D 25 HYDROXY (VIT D DEFICIENCY, FRACTURES): Vit D, 25-Hydroxy: 41.4 ng/mL (ref 30.0–100.0)

## 2020-09-08 LAB — LIPID PANEL WITH LDL/HDL RATIO
Cholesterol, Total: 191 mg/dL (ref 100–199)
HDL: 59 mg/dL (ref >39)
LDL Chol Calc (NIH): 115 mg/dL — ABNORMAL HIGH (ref 0–99)
LDL/HDL Ratio: 1.9 ratio (ref 0.0–3.2)
Triglycerides: 95 mg/dL (ref 0–149)
VLDL Cholesterol Cal: 17 mg/dL (ref 5–40)

## 2020-09-08 LAB — SARS-COV-2 SEMI-QUANTITATIVE TOTAL ANTIBODY, SPIKE: SARS-CoV-2 Spike Ab Interp: POSITIVE

## 2020-09-08 LAB — HEMOGLOBIN A1C
Est. average glucose Bld gHb Est-mCnc: 137 mg/dL
Hgb A1c MFr Bld: 6.4 % — ABNORMAL HIGH (ref 4.8–5.6)

## 2020-09-08 LAB — TSH: TSH: 4.77 u[IU]/mL — ABNORMAL HIGH (ref 0.450–4.500)

## 2020-09-09 ENCOUNTER — Encounter: Payer: Self-pay | Admitting: Physician Assistant

## 2020-09-09 DIAGNOSIS — E034 Atrophy of thyroid (acquired): Secondary | ICD-10-CM

## 2020-09-10 MED ORDER — LEVOTHYROXINE SODIUM 75 MCG PO TABS: 75.0000 ug | ORAL_TABLET | Freq: Every day | ORAL | 3 refills | Status: DC

## 2020-09-10 NOTE — Addendum Note (Signed)
Addended by: Mar Daring on: 09/10/2020 02:05 PM   Modules accepted: Orders

## 2020-09-21 DIAGNOSIS — L821 Other seborrheic keratosis: Secondary | ICD-10-CM | POA: Diagnosis not present

## 2020-09-21 DIAGNOSIS — D2272 Melanocytic nevi of left lower limb, including hip: Secondary | ICD-10-CM | POA: Diagnosis not present

## 2020-09-21 DIAGNOSIS — L570 Actinic keratosis: Secondary | ICD-10-CM | POA: Diagnosis not present

## 2020-09-21 DIAGNOSIS — L578 Other skin changes due to chronic exposure to nonionizing radiation: Secondary | ICD-10-CM | POA: Diagnosis not present

## 2020-09-21 DIAGNOSIS — Z872 Personal history of diseases of the skin and subcutaneous tissue: Secondary | ICD-10-CM | POA: Diagnosis not present

## 2020-09-21 DIAGNOSIS — C44519 Basal cell carcinoma of skin of other part of trunk: Secondary | ICD-10-CM | POA: Diagnosis not present

## 2020-09-21 DIAGNOSIS — D485 Neoplasm of uncertain behavior of skin: Secondary | ICD-10-CM | POA: Diagnosis not present

## 2020-09-23 ENCOUNTER — Encounter: Payer: Self-pay | Admitting: Physician Assistant

## 2020-10-01 ENCOUNTER — Encounter: Payer: Self-pay | Admitting: Physician Assistant

## 2020-10-01 ENCOUNTER — Encounter: Payer: Self-pay | Admitting: Adult Health

## 2020-10-05 NOTE — Progress Notes (Signed)
Cardiology Office Note  Date:  10/06/2020   ID:  [name], DOB 12/29/1955, MRN [id number]  PCP:  Lori Daring, PA-C   Chief Complaint  Patient presents with  . New Patient (Initial Visit)    Ref by Fenton Malling, PA-C for abnormal EKG with her father having an MI at age 67. Medications reviewed by the patient verbally.     HPI:  [name] is a 65 year old woman with past medical history of  chest pain, shortness of breath, abnormal stress test, strong family history of coronary artery disease, obesity  Seen previously by cardiology in 2014 Who presents for new patient evaluation by referral from [facility] for abnormal EKG, hyperlipidemia  Reports that she has been doing well, Has not had a cardiac evaluation in quite some time Prior records reviewed  Cardiac CTA 2014 with no coronary disease, no coronary calcification  Lab work reviewed A1C 6.4 Total cholesterol 191 LDL 115  She does drink significant amounts of Sweet tea, large volume Recently changed her drink, hoping to lose weight  On Lasix diuretic daily, one a day 40 daily Does not take 2, does not take metolazone  EKG personally reviewed by myself on todays visit NSR rate 64 bpm, no significant ST or T wave changes  Other past medical history reviewed  Prior cardiac CT scan November 2008 showing coronary calcium score of 0, no significant coronary artery disease, normal LV and RV size and systolic function  Prior echocardiogram September 2011 was essentially normal with ejection fraction greater than 55%, normal right ventricular systolic pressure    LFTs are mildly elevated by notes from primary care recent lab work shows total cholesterol 224, LDL 125, HDL 78, vitamin D 23, hemoglobin A1c 5.9   PMH:   has a past medical history of Broken arm, Cervical cancer ([place]), Chronic kidney disease, stage III (moderate) ([place]), Foot fracture (2013), Hiatal hernia, Hypopotassemia, OSA on CPAP,  Other abnormal glucose, Pure hypercholesterolemia, Reflux esophagitis, Sleep apnea, Thyroid fullness, Thyroid nodule, and Unspecified vitamin D deficiency.  PSH:    Past Surgical History:  Procedure Laterality Date  . CHOLECYSTECTOMY    . ELBOW FRACTURE SURGERY Right   . FOOT SURGERY Left 03/2010  . GALLBLADDER SURGERY    . GANGLION CYST EXCISION    . TOTAL VAGINAL HYSTERECTOMY      Current Outpatient Medications  Medication Sig Dispense Refill  . albuterol (VENTOLIN HFA) 108 (90 Base) MCG/ACT inhaler Inhale 2 puffs into the lungs every 6 (six) hours as needed. 18 g 1  . allopurinol (ZYLOPRIM) 100 MG tablet Take 1 tablet (100 mg total) by mouth daily. 90 tablet 3  . clobetasol (TEMOVATE) 0.05 % external solution Apply 1 application topically as needed. Due to CPAP use    . cyclobenzaprine (FLEXERIL) 5 MG tablet TAKE 1 TABLET BY MOUTH 3 TIMES DAILY AS MNEEDED FOR MUSCLE SPASMS 90 tablet 3  . fluticasone (FLONASE) 50 MCG/ACT nasal spray USE 2 SPRAYS INTO EACH NOSTRIL DAILY 48 g 1  . furosemide (LASIX) 40 MG tablet Take 1 tablet (40 mg total) by mouth 2 (two) times daily. 180 tablet 1  . hydrOXYzine (ATARAX/VISTARIL) 25 MG tablet Take 1 tablet (25 mg total) by mouth 3 (three) times daily as needed. 90 tablet 3  . ibuprofen (IBU) 600 MG tablet TAKE 1 TABLET BY BY MOUTH EVERY 8 HOURS AS NEEDED 30 tablet 1  . levothyroxine (SYNTHROID) 75 MCG tablet Take 1 tablet (75 mcg total) by  mouth daily. 90 tablet 3  . montelukast (SINGULAIR) 10 MG tablet Take 1 tablet (10 mg total) by mouth at bedtime. 90 tablet 3  . NON FORMULARY CPAP nightly.    Marland Kitchen olopatadine (PATANOL) 0.1 % ophthalmic solution Place 1 drop into both eyes daily. As needed 5 mL 5  . Omega-3 Fatty Acids (FISH OIL) 1200 MG CAPS Take 2 capsules twice a day.    . potassium chloride (KLOR-CON) 10 MEQ tablet TAKE TWO TABLETS TWICE DAILY 120 tablet 5  . Vitamin D, Ergocalciferol, (DRISDOL) 1.25 MG (50000 UNIT) CAPS capsule Take 1 capsule  (50,000 Units total) by mouth once a week. 12 capsule 3   No current facility-administered medications for this visit.     Allergies:   Penicillins   Social History:  The patient  reports that she has never smoked. She has never used smokeless tobacco. She reports current alcohol use. She reports that she does not use drugs.   Family History:   family history includes CAD in her father; Diabetes in her brother; Heart attack (age of onset: 2) in her father; Heart disease in her father; Hypertension in her brother.    Review of Systems: Review of Systems  Constitutional: Negative.   HENT: Negative.   Respiratory: Negative.   Cardiovascular: Negative.   Gastrointestinal: Negative.   Musculoskeletal: Negative.   Neurological: Negative.   Psychiatric/Behavioral: Negative.   All other systems reviewed and are negative.   PHYSICAL EXAM: VS:  BP 124/78 (BP Location: Right Arm, Patient Position: Sitting, Cuff Size: Large)   Pulse 64   Ht 5\' 8"  (1.727 m)   Wt 237 lb 6 oz (107.7 kg)   SpO2 98%   BMI 36.09 kg/m  , BMI Body mass index is 36.09 kg/m. GEN: Well nourished, well developed, in no acute distress HEENT: normal Neck: no JVD, carotid bruits, or masses Cardiac: RRR; no murmurs, rubs, or gallops,no edema  Respiratory:  clear to auscultation bilaterally, normal work of breathing GI: soft, nontender, nondistended, + BS MS: no deformity or atrophy Skin: warm and dry, no rash Neuro:  Strength and sensation are intact Psych: euthymic mood, full affect   Recent Labs: 09/07/2020: ALT 21; BUN 18; Creatinine, Ser 1.13; Hemoglobin 12.5; Platelets 211; Potassium 4.5; Sodium 143; TSH 4.770    Lipid Panel Lab Results  Component Value Date   CHOL 191 09/07/2020   HDL 59 09/07/2020   LDLCALC 115 (H) 09/07/2020   TRIG 95 09/07/2020      Wt Readings from Last 3 Encounters:  10/06/20 237 lb 6 oz (107.7 kg)  09/07/20 240 lb (108.9 kg)  05/18/20 238 lb (108 kg)      ASSESSMENT  AND PLAN:  Problem List Items Addressed This Visit    Chronic kidney disease (CKD), stage III (moderate) (HCC)    Other Visit Diagnoses    Essential hypertension    -  Primary   Relevant Orders   EKG 12-Lead   Mixed hyperlipidemia       Abnormal EKG       Relevant Orders   EKG 12-Lead      Abnormal EKG Essentially normal study, no acute findings Discussed cardiac risk factors, prior cardiac CTA with no coronary disease She prefers no cholesterol medication at this time, prefers to lose weight, diet exercise, she has changed her sweet tea to a noncalorie beverage -Recommended exercise/walking program for weight loss and conditioning For any symptoms could order coronary calcium scoring for updated study  Essential  hypertension Blood pressure is well controlled on today's visit. No changes made to the medications.  Hyperlipidemia Lifestyle modification at this time After discussion, we will hold off on medications  Diabetes type 2 without complications Recently changed to beverage, recommended weight loss   Total encounter time more than 45 minutes  Greater than 50% was spent in counseling and coordination of care with the patient    Signed, Esmond Plants, M.D., Ph.D. [place], [place]

## 2020-10-06 ENCOUNTER — Other Ambulatory Visit: Payer: Self-pay

## 2020-10-06 ENCOUNTER — Ambulatory Visit: Payer: PPO | Admitting: Cardiovascular Disease

## 2020-10-06 ENCOUNTER — Encounter: Payer: Self-pay | Admitting: Cardiovascular Disease

## 2020-10-06 VITALS — BP 124/78 | HR 64 | Ht 68.0 in | Wt 237.4 lb

## 2020-10-06 DIAGNOSIS — E782 Mixed hyperlipidemia: Secondary | ICD-10-CM

## 2020-10-06 DIAGNOSIS — Z794 Long term (current) use of insulin: Secondary | ICD-10-CM | POA: Diagnosis not present

## 2020-10-06 DIAGNOSIS — I10 Essential (primary) hypertension: Secondary | ICD-10-CM

## 2020-10-06 DIAGNOSIS — N1831 Chronic kidney disease, stage 3a: Secondary | ICD-10-CM | POA: Diagnosis not present

## 2020-10-06 DIAGNOSIS — R9431 Abnormal electrocardiogram [ECG] [EKG]: Secondary | ICD-10-CM | POA: Diagnosis not present

## 2020-10-06 DIAGNOSIS — E119 Type 2 diabetes mellitus without complications: Secondary | ICD-10-CM | POA: Diagnosis not present

## 2020-10-06 MED ORDER — METOLAZONE 5 MG PO TABS: 5.0000 mg | ORAL_TABLET | Freq: Every day | ORAL | 3 refills | Status: DC | PRN

## 2020-10-06 NOTE — Patient Instructions (Signed)
Medication Instructions:  No changes  If you need a refill on your cardiac medications before your next appointment, please call your pharmacy.    Lab work: No new labs needed   If you have labs (blood work) drawn today and your tests are completely normal, you will receive your results only by: . MyChart Message (if you have MyChart) OR . A paper copy in the mail If you have any lab test that is abnormal or we need to change your treatment, we will call you to review the results.   Testing/Procedures: No new testing needed   Follow-Up: At [facility], you and your health needs are our priority.  As part of our continuing mission to provide you with exceptional heart care, we have created designated Provider Care Teams.  These Care Teams include your primary Cardiologist (physician) and Advanced Practice Providers (APPs -  Physician Assistants and Nurse Practitioners) who all work together to provide you with the care you need, when you need it.  . You will need a follow up appointment in 12 months  . Providers on your designated Care Team:   . Christopher Berge, NP . Ryan Dunn, PA-C . Jacquelyn Visser, PA-C  Any Other Special Instructions Will Be Listed Below (If Applicable).  COVID-19 Vaccine Information can be found at: [email or web address] For questions related to vaccine distribution or appointments, please email [email or web address] or call [phone].     

## 2020-11-27 DIAGNOSIS — G4733 Obstructive sleep apnea (adult) (pediatric): Secondary | ICD-10-CM | POA: Diagnosis not present

## 2020-12-14 ENCOUNTER — Other Ambulatory Visit: Payer: Self-pay | Admitting: Physician Assistant

## 2020-12-14 DIAGNOSIS — N1831 Chronic kidney disease, stage 3a: Secondary | ICD-10-CM

## 2021-02-15 ENCOUNTER — Other Ambulatory Visit: Payer: Self-pay | Admitting: Physician Assistant

## 2021-02-15 DIAGNOSIS — S39012A Strain of muscle, fascia and tendon of lower back, initial encounter: Secondary | ICD-10-CM

## 2021-02-17 DIAGNOSIS — C44519 Basal cell carcinoma of skin of other part of trunk: Secondary | ICD-10-CM | POA: Diagnosis not present

## 2021-02-17 DIAGNOSIS — C4491 Basal cell carcinoma of skin, unspecified: Secondary | ICD-10-CM | POA: Diagnosis not present

## 2021-02-25 ENCOUNTER — Telehealth: Payer: Self-pay | Admitting: *Deleted

## 2021-02-25 NOTE — Chronic Care Management (AMB) (Signed)
  Chronic Care Management   Note  02/25/2021 Name: [name] MRN: [id number] DOB: Aug 01, 1955  [name] is a 65 y.o. year old female who is a primary care patient of Rubye Beach. I reached out to Donia Guiles by phone today in response to a referral sent by Ms. Beverly Gust Gorum's PCP.  [name] was given information about Chronic Care Management services today including:  CCM service includes personalized support from designated clinical staff supervised by her physician, including individualized plan of care and coordination with other care providers 24/7 contact phone numbers for assistance for urgent and routine care needs. Service will only be billed when office clinical staff spend 20 minutes or more in a month to coordinate care. Only one practitioner may furnish and bill the service in a calendar month. The patient may stop CCM services at any time (effective at the end of the month) by phone call to the office staff. The patient is responsible for co-pay (up to 20% after annual deductible is met) if co-pay is required by the individual health plan.   Patient agreed to services and verbal consent obtained.   Follow up plan: Telephone appointment with care management team member scheduled for: 03/08/2021  Julian Hy, [place] [facility]  Direct Dial: [phone]

## 2021-03-01 NOTE — Telephone Encounter (Signed)
LOV: 09/07/2020 NOV:  04/13/2021   Last Refill:  09/07/2020 #30 1 Refill

## 2021-03-02 DIAGNOSIS — G4733 Obstructive sleep apnea (adult) (pediatric): Secondary | ICD-10-CM | POA: Diagnosis not present

## 2021-03-08 ENCOUNTER — Ambulatory Visit: Payer: PPO

## 2021-03-08 DIAGNOSIS — J4520 Mild intermittent asthma, uncomplicated: Secondary | ICD-10-CM

## 2021-03-08 NOTE — Chronic Care Management (AMB) (Signed)
  Chronic Care Management   CCM RN Visit Note  03/08/2021 Name: [name] MRN: [id number] DOB: Aug 26, 1955  Subjective: [name] is a 66 y.o. year old female. The care management team was consulted for assistance with disease management and care coordination needs.    Engaged with patient by telephone to discuss care management needs. She has a pending appointment to establish care with her new Primary Care Provider.   Outpatient Encounter Medications as of 03/08/2021  Medication Sig Note   albuterol (VENTOLIN HFA) 108 (90 Base) MCG/ACT inhaler Inhale 2 puffs into the lungs every 6 (six) hours as needed.    allopurinol (ZYLOPRIM) 100 MG tablet Take 1 tablet (100 mg total) by mouth daily.    cyclobenzaprine (FLEXERIL) 5 MG tablet TAKE 1 TABLET BY MOUTH 3 TIMES DAILY AS MNEEDED FOR MUSCLE SPASMS    fluticasone (FLONASE) 50 MCG/ACT nasal spray USE 2 SPRAYS INTO EACH NOSTRIL DAILY    furosemide (LASIX) 40 MG tablet TAKE 1 TABLET BY MOUTH TWICE DAILY 03/08/2021: Currently taking 40 mg once a day   hydrOXYzine (ATARAX/VISTARIL) 25 MG tablet Take 1 tablet (25 mg total) by mouth 3 (three) times daily as needed.    ibuprofen (IBU) 600 MG tablet TAKE 1 TABLET BY MOUTH EVERY 8 HOURS AS NEEDED.    levothyroxine (SYNTHROID) 75 MCG tablet Take 1 tablet (75 mcg total) by mouth daily.    montelukast (SINGULAIR) 10 MG tablet Take 1 tablet (10 mg total) by mouth at bedtime. 03/08/2021: Currently taking in the morning   olopatadine (PATANOL) 0.1 % ophthalmic solution Place 1 drop into both eyes daily. As needed    Vitamin D, Ergocalciferol, (DRISDOL) 1.25 MG (50000 UNIT) CAPS capsule Take 1 capsule (50,000 Units total) by mouth once a week.    clobetasol (TEMOVATE) 0.05 % external solution Apply 1 application topically as needed. Due to CPAP use    NON FORMULARY CPAP nightly.    Omega-3 Fatty Acids (FISH OIL) 1200 MG CAPS Take 2 capsules twice a day.    potassium chloride (KLOR-CON) 10 MEQ tablet TAKE  TWO TABLETS TWICE DAILY 03/08/2021: Currently taking two tablets once a day   No facility-administered encounter medications on file as of 03/08/2021.     [name] has a pending appointment to establish care with her new primary care provider. Her medications were reviewed today. Denies concerns regarding medication management or prescription cost. Denies urgent concerns or immediate care management needs. She will be evaluated by her new PCP on 04/13/21. Agreed to call with questions if needed prior to her scheduled appointment.   PLAN A member of the care management team will complete outreach with [name] next month.   [facility] Care [facility] [phone]

## 2021-04-13 ENCOUNTER — Ambulatory Visit: Payer: Self-pay | Admitting: Family Medicine

## 2021-04-13 NOTE — Progress Notes (Deleted)
Established patient visit   Patient: [name]   DOB: 11-15-1955   65 y.o. Female  MRN: [id number] Visit Date: 04/13/2021  Today's healthcare provider: Lavon Paganini, MD   No chief complaint on file.  Subjective    HPI  Hypothyroid, follow-up  Lab Results  Component Value Date   TSH 4.770 (H) 09/07/2020   TSH 3.910 09/30/2019   TSH 3.280 12/10/2018   T4TOTAL 6.9 12/10/2018   Wt Readings from Last 3 Encounters:  10/06/20 237 lb 6 oz (107.7 kg)  09/07/20 240 lb (108.9 kg)  05/18/20 238 lb (108 kg)    She was last seen for hypothyroid 6 months ago.  Management since that visit includes increase levothyroxine 30mcg daily. Recheck thyroid in 8 weeks She reports {excellent/good/fair/poor:19665} compliance with treatment. She {is/is not:21021397} having side effects. {document side effects if present:1}  Symptoms: {Yes/No:20286} change in energy level {Yes/No:20286} constipation  {Yes/No:20286} diarrhea {Yes/No:20286} heat / cold intolerance  {Yes/No:20286} nervousness {Yes/No:20286} palpitations  {Yes/No:20286} weight changes    ----------------------------------------------------------------------------------------- Follow up for vitamin d deficiency  The patient was last seen for this 6 months ago. Changes made at last visit include continue drisdol 50000 unit once a week.  She reports {excellent/good/fair/poor:19665} compliance with treatment. She feels that condition is {improved/worse/unchanged:3041574}. She {is/is not:21021397} having side effects. ***  ----------------------------------------------------------------------------------------- Prediabetes, Follow-up  Lab Results  Component Value Date   HGBA1C 6.4 (H) 09/07/2020   HGBA1C 6.2 (H) 09/30/2019   HGBA1C 6.3 (H) 12/10/2018   GLUCOSE 111 (H) 09/07/2020   GLUCOSE 89 09/30/2019   GLUCOSE 87 12/10/2018    Last seen for for this7 months ago.  Management since that visit includes no  changes. Current symptoms include {Symptoms; diabetes:14075} and have been {Desc; course:15616}.  Prior visit with dietician: {yes/no:17258} Current diet: {diet habits:16563} Current exercise: {exercise types:16438}  Pertinent Labs:    Component Value Date/Time   CHOL 191 09/07/2020 1022   TRIG 95 09/07/2020 1022   CHOLHDL 2.9 09/30/2019 1134   CREATININE 1.13 (H) 09/07/2020 1022    Wt Readings from Last 3 Encounters:  10/06/20 237 lb 6 oz (107.7 kg)  09/07/20 240 lb (108.9 kg)  05/18/20 238 lb (108 kg)    -----------------------------------------------------------------------------------------   {Link to patient history deactivated due to formatting error:1}  Medications: Outpatient Medications Prior to Visit  Medication Sig  . albuterol (VENTOLIN HFA) 108 (90 Base) MCG/ACT inhaler Inhale 2 puffs into the lungs every 6 (six) hours as needed.  Marland Kitchen allopurinol (ZYLOPRIM) 100 MG tablet Take 1 tablet (100 mg total) by mouth daily.  . clobetasol (TEMOVATE) 0.05 % external solution Apply 1 application topically as needed. Due to CPAP use  . cyclobenzaprine (FLEXERIL) 5 MG tablet TAKE 1 TABLET BY MOUTH 3 TIMES DAILY AS MNEEDED FOR MUSCLE SPASMS  . fluticasone (FLONASE) 50 MCG/ACT nasal spray USE 2 SPRAYS INTO EACH NOSTRIL DAILY  . furosemide (LASIX) 40 MG tablet TAKE 1 TABLET BY MOUTH TWICE DAILY  . hydrOXYzine (ATARAX/VISTARIL) 25 MG tablet Take 1 tablet (25 mg total) by mouth 3 (three) times daily as needed.  Marland Kitchen ibuprofen (IBU) 600 MG tablet TAKE 1 TABLET BY MOUTH EVERY 8 HOURS AS NEEDED.  Marland Kitchen levothyroxine (SYNTHROID) 75 MCG tablet Take 1 tablet (75 mcg total) by mouth daily.  . montelukast (SINGULAIR) 10 MG tablet Take 1 tablet (10 mg total) by mouth at bedtime.  . NON FORMULARY CPAP nightly.  Marland Kitchen olopatadine (PATANOL) 0.1 % ophthalmic solution Place 1 drop  into both eyes daily. As needed  . Omega-3 Fatty Acids (FISH OIL) 1200 MG CAPS Take 2 capsules twice a day.  . potassium  chloride (KLOR-CON) 10 MEQ tablet TAKE TWO TABLETS TWICE DAILY  . Vitamin D, Ergocalciferol, (DRISDOL) 1.25 MG (50000 UNIT) CAPS capsule Take 1 capsule (50,000 Units total) by mouth once a week.   No facility-administered medications prior to visit.    Review of Systems  Constitutional:  Negative for activity change, appetite change, fatigue and unexpected weight change.  Eyes:  Negative for visual disturbance.  Respiratory:  Negative for chest tightness and shortness of breath.   Cardiovascular:  Negative for chest pain and palpitations.  Endocrine: Negative for cold intolerance and heat intolerance.   {Labs  Heme  Chem  Endocrine  Serology  Results Review (optional):23779}   Objective    There were no vitals taken for this visit. {Show previous vital signs (optional):23777}  Physical Exam  ***  No results found for any visits on 04/13/21.  Assessment & Plan     ***  No follow-ups on file.      {provider attestation***:1}   Lavon Paganini, MD  [facility] [phone] (phone) [phone] (fax)  [place]

## 2021-04-16 ENCOUNTER — Ambulatory Visit: Payer: PPO

## 2021-04-16 DIAGNOSIS — J4520 Mild intermittent asthma, uncomplicated: Secondary | ICD-10-CM

## 2021-04-16 NOTE — Chronic Care Management (AMB) (Signed)
Chronic Care Management   CCM RN Visit Note  04/16/2021 Name: [name] MRN: [id number] DOB: Sep 21, 1955  Subjective: [name] is a 65 y.o. year old female. The care management team was consulted for assistance with disease management and care coordination needs.    Engaged with patient by telephone for initial visit in response to provider referral for case management and care coordination services.   Consent to Services:  The patient was given the following information about Chronic Care Management services 1. CCM service includes personalized support from designated clinical staff supervised by the primary care provider, including individualized plan of care and coordination with other care providers 2. 24/7 contact phone numbers for assistance for urgent and routine care needs. 3. Service will only be billed when office clinical staff spend 20 minutes or more in a month to coordinate care. 4. Only one practitioner may furnish and bill the service in a calendar month. 5.The patient may stop CCM services at any time (effective at the end of the month) by phone call to the office staff. 6. The patient will be responsible for cost sharing (co-pay) of up to 20% of the service fee (after annual deductible is met). Patient declined need for additional service.     Assessment: Review of patient past medical history, allergies, medications, health status, including review of consultants reports, laboratory and other test data, was performed as part of comprehensive evaluation and provision of chronic care management services.   SDOH (Social Determinants of Health) assessments and interventions performed: No  CCM Care Plan  Allergies  Allergen Reactions   Penicillins     Swelling  Hives     Outpatient Encounter Medications as of 04/16/2021  Medication Sig Note   albuterol (VENTOLIN HFA) 108 (90 Base) MCG/ACT inhaler Inhale 2 puffs into the lungs every 6 (six) hours as needed.     allopurinol (ZYLOPRIM) 100 MG tablet Take 1 tablet (100 mg total) by mouth daily.    clobetasol (TEMOVATE) 0.05 % external solution Apply 1 application topically as needed. Due to CPAP use    cyclobenzaprine (FLEXERIL) 5 MG tablet TAKE 1 TABLET BY MOUTH 3 TIMES DAILY AS MNEEDED FOR MUSCLE SPASMS    fluticasone (FLONASE) 50 MCG/ACT nasal spray USE 2 SPRAYS INTO EACH NOSTRIL DAILY    furosemide (LASIX) 40 MG tablet TAKE 1 TABLET BY MOUTH TWICE DAILY 03/08/2021: Currently taking 40 mg once a day   hydrOXYzine (ATARAX/VISTARIL) 25 MG tablet Take 1 tablet (25 mg total) by mouth 3 (three) times daily as needed.    ibuprofen (IBU) 600 MG tablet TAKE 1 TABLET BY MOUTH EVERY 8 HOURS AS NEEDED.    levothyroxine (SYNTHROID) 75 MCG tablet Take 1 tablet (75 mcg total) by mouth daily.    montelukast (SINGULAIR) 10 MG tablet Take 1 tablet (10 mg total) by mouth at bedtime. 03/08/2021: Currently taking in the morning   NON FORMULARY CPAP nightly.    olopatadine (PATANOL) 0.1 % ophthalmic solution Place 1 drop into both eyes daily. As needed    Omega-3 Fatty Acids (FISH OIL) 1200 MG CAPS Take 2 capsules twice a day.    potassium chloride (KLOR-CON) 10 MEQ tablet TAKE TWO TABLETS TWICE DAILY 03/08/2021: Currently taking two tablets once a day   Vitamin D, Ergocalciferol, (DRISDOL) 1.25 MG (50000 UNIT) CAPS capsule Take 1 capsule (50,000 Units total) by mouth once a week.    No facility-administered encounter medications on file as of 04/16/2021.    Patient Active  Problem List   Diagnosis Date Noted   Abortion, spontaneous 01/28/2015   Allergic rhinitis 01/28/2015   Angular blepharoconjunctivitis 01/28/2015   Asthma 01/28/2015   History of cervical cancer 01/28/2015   Chronic kidney disease (CKD), stage III (moderate) ([facility]) 01/28/2015   Dermatitis, eczematoid 01/28/2015   Edema 01/28/2015   Esophagitis, reflux 01/28/2015   Exposure to viral hepatitis 01/28/2015   Closed fracture of foot 01/28/2015   Hiatal  hernia 01/28/2015   Decreased potassium in the blood 01/28/2015   Adult hypothyroidism 01/28/2015   Nonspecific ST-T changes 01/28/2015   Obstructive sleep apnea 01/28/2015   Borderline diabetes 01/28/2015   Hypercholesterolemia without hypertriglyceridemia 01/28/2015   Thyroid nodule 01/28/2015   Vitamin D deficiency 01/28/2015   SHORTNESS OF BREATH 02/17/2010     [name] reports doing well. Declined need for assistance with medications or care management. She has not established care with a new provider since her previous PCP's departure. Requested assistance with scheduling an appointment with a new provider at [facility]. Will coordinate with the clinic manager to confirm her new PCP and schedule a visit as requested. [name] agreed to call or contact the clinic if she requires additional outreach. The care management team will gladly assist.   [name] Care Management [facility] [phone]

## 2021-04-19 ENCOUNTER — Telehealth: Payer: Self-pay

## 2021-04-19 ENCOUNTER — Ambulatory Visit (INDEPENDENT_AMBULATORY_CARE_PROVIDER_SITE_OTHER): Payer: PPO

## 2021-04-19 DIAGNOSIS — J4520 Mild intermittent asthma, uncomplicated: Secondary | ICD-10-CM

## 2021-04-19 NOTE — Chronic Care Management (AMB) (Signed)
  Chronic Care Management   CCM RN Visit Note  04/19/2021 Name: CHELLI YERKES MRN: [id number] DOB: 04/23/1956  Subjective: [name] is a 65 y.o. year old female. Reviewed care management needs. Reports she will not require additional outreach. Requested assistance with scheduling an office visit with a new provider in December. Her PCP is no longer at the clinic.   Contacted the scheduling team. Relayed patient's request to be evaluated in December between 0800 and 1000 due to her schedule. The team was unable to schedule at the time of call but agreed to follow up with Mrs. Rumsey once available dates are confirmed.     PLAN Mrs. Headlee is aware that she will be contacted by [facility] to confirm an appointment date. Agreed to contact the clinic if her health needs change and she requires outreach. The care management team will gladly assist.   [facility] Care [facility] [phone]

## 2021-04-19 NOTE — Telephone Encounter (Signed)
Copied from [place] [id number]. Topic: Appointment Scheduling - Scheduling Inquiry for Clinic >> Apr 19, 2021  2:15 PM Tessa Lerner A wrote: Reason for CRM: The patient would like to be seen by [facility]. Rollene Rotunda or [employer]. Drubel when possible  The patient would like to be seen in the month of December between the hours of 8:00 AM and 24:09 AM   Felicia with [facility] has called on the patient's behalf  Please contact the patient for further scheduling

## 2021-05-05 DIAGNOSIS — J4520 Mild intermittent asthma, uncomplicated: Secondary | ICD-10-CM

## 2021-05-11 ENCOUNTER — Other Ambulatory Visit: Payer: Self-pay

## 2021-05-11 ENCOUNTER — Telehealth: Payer: Self-pay

## 2021-05-11 NOTE — Telephone Encounter (Signed)
Error - wrong pt

## 2021-05-12 ENCOUNTER — Ambulatory Visit (INDEPENDENT_AMBULATORY_CARE_PROVIDER_SITE_OTHER): Payer: PPO

## 2021-05-12 DIAGNOSIS — Z1231 Encounter for screening mammogram for malignant neoplasm of breast: Secondary | ICD-10-CM | POA: Diagnosis not present

## 2021-05-12 DIAGNOSIS — Z780 Asymptomatic menopausal state: Secondary | ICD-10-CM

## 2021-05-12 DIAGNOSIS — Z0000 Encounter for general adult medical examination without abnormal findings: Secondary | ICD-10-CM

## 2021-05-12 NOTE — Patient Instructions (Signed)
[name] , Thank you for taking time to come for your Medicare Wellness Visit. I appreciate your ongoing commitment to your health goals. Please review the following plan we discussed and let me know if I can assist you in the future.   Screening recommendations/referrals: Colonoscopy: cologuard 12/9618 Mammogram: 01/18/19 ordered Bone Density: 07/11/12 ordered Recommended yearly ophthalmology/optometry visit for glaucoma screening and checkup Recommended yearly dental visit for hygiene and checkup  Vaccinations: Influenza vaccine: 10/08/18- due now Pneumococcal vaccine: 06/24/15 Tdap vaccine: 10/08/18 Shingles vaccine: zostrix 08/03/12   Covid-19:07/29/19, 08/20/19  Advanced directives: no  Conditions/risks identified:   Next appointment: Follow up in one year for your annual wellness visit 05/16/22 @ 9am   Preventive Care 75 Years and Older, Female Preventive care refers to lifestyle choices and visits with your health care provider that can promote health and wellness. What does preventive care include? A yearly physical exam. This is also called an annual well check. Dental exams once or twice a year. Routine eye exams. Ask your health care provider how often you should have your eyes checked. Personal lifestyle choices, including: Daily care of your teeth and gums. Regular physical activity. Eating a healthy diet. Avoiding tobacco and drug use. Limiting alcohol use. Practicing safe sex. Taking low-dose aspirin every day. Taking vitamin and mineral supplements as recommended by your health care provider. What happens during an annual well check? The services and screenings done by your health care provider during your annual well check will depend on your age, overall health, lifestyle risk factors, and family history of disease. Counseling  Your health care provider may ask you questions about your: Alcohol use. Tobacco use. Drug use. Emotional well-being. Home and  relationship well-being. Sexual activity. Eating habits. History of falls. Memory and ability to understand (cognition). Work and work Statistician. Reproductive health. Screening  You may have the following tests or measurements: Height, weight, and BMI. Blood pressure. Lipid and cholesterol levels. These may be checked every 5 years, or more frequently if you are over 53 years old. Skin check. Lung cancer screening. You may have this screening every year starting at age 30 if you have a 30-pack-year history of smoking and currently smoke or have quit within the past 15 years. Fecal occult blood test (FOBT) of the stool. You may have this test every year starting at age 57. Flexible sigmoidoscopy or colonoscopy. You may have a sigmoidoscopy every 5 years or a colonoscopy every 10 years starting at age 15. Hepatitis C blood test. Hepatitis B blood test. Sexually transmitted disease (STD) testing. Diabetes screening. This is done by checking your blood sugar (glucose) after you have not eaten for a while (fasting). You may have this done every 1-3 years. Bone density scan. This is done to screen for osteoporosis. You may have this done starting at age 22. Mammogram. This may be done every 1-2 years. Talk to your health care provider about how often you should have regular mammograms. Talk with your health care provider about your test results, treatment options, and if necessary, the need for more tests. Vaccines  Your health care provider may recommend certain vaccines, such as: Influenza vaccine. This is recommended every year. Tetanus, diphtheria, and acellular pertussis (Tdap, Td) vaccine. You may need a Td booster every 10 years. Zoster vaccine. You may need this after age 65. Pneumococcal 13-valent conjugate (PCV13) vaccine. One dose is recommended after age 83. Pneumococcal polysaccharide (PPSV23) vaccine. One dose is recommended after age 81. Talk to your  health care provider  about which screenings and vaccines you need and how often you need them. This information is not intended to replace advice given to you by your health care provider. Make sure you discuss any questions you have with your health care provider. Document Released: 06/19/2015 Document Revised: 02/10/2016 Document Reviewed: 03/24/2015 Elsevier Interactive Patient Education  2017 [place] Prevention in the Home Falls can cause injuries. They can happen to people of all ages. There are many things you can do to make your home safe and to help prevent falls. What can I do on the outside of my home? Regularly fix the edges of walkways and driveways and fix any cracks. Remove anything that might make you trip as you walk through a door, such as a raised step or threshold. Trim any bushes or trees on the path to your home. Use bright outdoor lighting. Clear any walking paths of anything that might make someone trip, such as rocks or tools. Regularly check to see if handrails are loose or broken. Make sure that both sides of any steps have handrails. Any raised decks and porches should have guardrails on the edges. Have any leaves, snow, or ice cleared regularly. Use sand or salt on walking paths during winter. Clean up any spills in your garage right away. This includes oil or grease spills. What can I do in the bathroom? Use night lights. Install grab bars by the toilet and in the tub and shower. Do not use towel bars as grab bars. Use non-skid mats or decals in the tub or shower. If you need to sit down in the shower, use a plastic, non-slip stool. Keep the floor dry. Clean up any water that spills on the floor as soon as it happens. Remove soap buildup in the tub or shower regularly. Attach bath mats securely with double-sided non-slip rug tape. Do not have throw rugs and other things on the floor that can make you trip. What can I do in the bedroom? Use night lights. Make sure  that you have a light by your bed that is easy to reach. Do not use any sheets or blankets that are too big for your bed. They should not hang down onto the floor. Have a firm chair that has side arms. You can use this for support while you get dressed. Do not have throw rugs and other things on the floor that can make you trip. What can I do in the kitchen? Clean up any spills right away. Avoid walking on wet floors. Keep items that you use a lot in easy-to-reach places. If you need to reach something above you, use a strong step stool that has a grab bar. Keep electrical cords out of the way. Do not use floor polish or wax that makes floors slippery. If you must use wax, use non-skid floor wax. Do not have throw rugs and other things on the floor that can make you trip. What can I do with my stairs? Do not leave any items on the stairs. Make sure that there are handrails on both sides of the stairs and use them. Fix handrails that are broken or loose. Make sure that handrails are as long as the stairways. Check any carpeting to make sure that it is firmly attached to the stairs. Fix any carpet that is loose or worn. Avoid having throw rugs at the top or bottom of the stairs. If you do have throw rugs, attach them  to the floor with carpet tape. Make sure that you have a light switch at the top of the stairs and the bottom of the stairs. If you do not have them, ask someone to add them for you. What else can I do to help prevent falls? Wear shoes that: Do not have high heels. Have rubber bottoms. Are comfortable and fit you well. Are closed at the toe. Do not wear sandals. If you use a stepladder: Make sure that it is fully opened. Do not climb a closed stepladder. Make sure that both sides of the stepladder are locked into place. Ask someone to hold it for you, if possible. Clearly mark and make sure that you can see: Any grab bars or handrails. First and last steps. Where the edge of  each step is. Use tools that help you move around (mobility aids) if they are needed. These include: Canes. Walkers. Scooters. Crutches. Turn on the lights when you go into a dark area. Replace any light bulbs as soon as they burn out. Set up your furniture so you have a clear path. Avoid moving your furniture around. If any of your floors are uneven, fix them. If there are any pets around you, be aware of where they are. Review your medicines with your doctor. Some medicines can make you feel dizzy. This can increase your chance of falling. Ask your doctor what other things that you can do to help prevent falls. This information is not intended to replace advice given to you by your health care provider. Make sure you discuss any questions you have with your health care provider. Document Released: 03/19/2009 Document Revised: 10/29/2015 Document Reviewed: 06/27/2014 Elsevier Interactive Patient Education  2017 [employer].

## 2021-05-12 NOTE — Progress Notes (Signed)
Virtual Visit via Telephone Note  I connected with  [name] on 05/12/21 at  9:40 AM EST by telephone and verified that I am speaking with the correct person using two identifiers.  Location: Patient: home Provider: [facility] Persons participating in the virtual visit: [facility]   I discussed the limitations, risks, security and privacy concerns of performing an evaluation and management service by telephone and the availability of in person appointments. The patient expressed understanding and agreed to proceed.  Interactive audio and video telecommunications were attempted between this nurse and patient, however failed, due to patient having technical difficulties OR patient did not have access to video capability.  We continued and completed visit with audio only.  Some vital signs may be absent or patient reported.   Lori David, LPN  Subjective:   [name] is a 65 y.o. female who presents for Medicare Annual (Subsequent) preventive examination.  Review of Systems     Cardiac Risk Factors include: advanced age (>47men, >18 women)     Objective:    There were no vitals filed for this visit. There is no height or weight on file to calculate BMI.  Advanced Directives 05/12/2021 09/16/2019  Does Patient Have a Medical Advance Directive? No No  Would patient like information on creating a medical advance directive? No - Patient declined No - Patient declined    Current Medications (verified) Outpatient Encounter Medications as of 05/12/2021  Medication Sig   albuterol (VENTOLIN HFA) 108 (90 Base) MCG/ACT inhaler Inhale 2 puffs into the lungs every 6 (six) hours as needed.   allopurinol (ZYLOPRIM) 100 MG tablet Take 1 tablet (100 mg total) by mouth daily.   clindamycin (CLEOCIN) 300 MG capsule Take 300 mg by mouth 2 (two) times daily.   clobetasol (TEMOVATE) 0.05 % external solution Apply 1 application topically as needed. Due to CPAP use    cyclobenzaprine (FLEXERIL) 5 MG tablet TAKE 1 TABLET BY MOUTH 3 TIMES DAILY AS MNEEDED FOR MUSCLE SPASMS   fluticasone (FLONASE) 50 MCG/ACT nasal spray USE 2 SPRAYS INTO EACH NOSTRIL DAILY   furosemide (LASIX) 40 MG tablet TAKE 1 TABLET BY MOUTH TWICE DAILY   hydrOXYzine (ATARAX/VISTARIL) 25 MG tablet Take 1 tablet (25 mg total) by mouth 3 (three) times daily as needed.   ibuprofen (IBU) 600 MG tablet TAKE 1 TABLET BY MOUTH EVERY 8 HOURS AS NEEDED.   levothyroxine (SYNTHROID) 50 MCG tablet Take 50 mcg by mouth every morning.   levothyroxine (SYNTHROID) 75 MCG tablet Take 1 tablet (75 mcg total) by mouth daily.   montelukast (SINGULAIR) 10 MG tablet Take 1 tablet (10 mg total) by mouth at bedtime.   NON FORMULARY CPAP nightly.   olopatadine (PATANOL) 0.1 % ophthalmic solution Place 1 drop into both eyes daily. As needed   Omega-3 Fatty Acids (FISH OIL) 1200 MG CAPS Take 2 capsules twice a day.   potassium chloride (KLOR-CON) 10 MEQ tablet TAKE TWO TABLETS TWICE DAILY   Vitamin D, Ergocalciferol, (DRISDOL) 1.25 MG (50000 UNIT) CAPS capsule Take 1 capsule (50,000 Units total) by mouth once a week.   No facility-administered encounter medications on file as of 05/12/2021.    Allergies (verified) Penicillins   History: Past Medical History:  Diagnosis Date   Broken arm    Cervical cancer ([place])    Chronic kidney disease, stage III (moderate) (HCC)    Foot fracture 2013   Hiatal hernia    Hypopotassemia    OSA on  CPAP    Other abnormal glucose    Pure hypercholesterolemia    Reflux esophagitis    Sleep apnea    Thyroid fullness    Thyroid nodule    Unspecified vitamin D deficiency    Past Surgical History:  Procedure Laterality Date   CHOLECYSTECTOMY     ELBOW FRACTURE SURGERY Right    FOOT SURGERY Left 03/2010   GALLBLADDER SURGERY     GANGLION CYST EXCISION     TOTAL VAGINAL HYSTERECTOMY     Family History  Problem Relation Age of Onset   Heart attack Father 59       MI    Heart disease Father    CAD Father    Hypertension Brother    Diabetes Brother    Social History   Socioeconomic History   Marital status: Married    Spouse name: Not on file   Number of children: 3   Years of education: Not on file   Highest education level: Some college, no degree  Occupational History    Comment: self employeed  Tobacco Use   Smoking status: Never   Smokeless tobacco: Never  Vaping Use   Vaping Use: Never used  Substance and Sexual Activity   Alcohol use: Yes    Alcohol/week: 0.0 - 1.0 standard drinks   Drug use: No   Sexual activity: Not on file  Other Topics Concern   Not on file  Social History Narrative   Not on file   Social Determinants of Health   Financial Resource Strain: Low Risk    Difficulty of Paying Living Expenses: Not hard at all  Food Insecurity: No Food Insecurity   Worried About Charity fundraiser in the Last Year: Never true   [place] in the Last Year: Never true  Transportation Needs: No Transportation Needs   Lack of Transportation (Medical): No   Lack of Transportation (Non-Medical): No  Physical Activity: Sufficiently Active   Days of Exercise per Week: 3 days   Minutes of Exercise per Session: 50 min  Stress: No Stress Concern Present   Feeling of Stress : Not at all  Social Connections: Socially Integrated   Frequency of Communication with Friends and Family: More than three times a week   Frequency of Social Gatherings with Friends and Family: More than three times a week   Attends Religious Services: More than 4 times per year   Active Member of [employer] or Organizations: Yes   Attends Archivist Meetings: 1 to 4 times per year   Marital Status: Married    Tobacco Counseling Counseling given: Not Answered   Clinical Intake:  Pre-visit preparation completed: Yes  Pain : No/denies pain     Nutritional Risks: None Diabetes: No  How often do you need to have someone help you when you read  instructions, pamphlets, or other written materials from your doctor or pharmacy?: 1 - Never  Diabetic?no  Interpreter Needed?: No  Information entered by :: Kirke Shaggy, LPN   Activities of Daily Living In your present state of health, do you have any difficulty performing the following activities: 05/12/2021  Hearing? N  Vision? N  Difficulty concentrating or making decisions? N  Walking or climbing stairs? N  Dressing or bathing? N  Doing errands, shopping? N  Preparing Food and eating ? N  Using the Toilet? N  In the past six months, have you accidently leaked urine? N  Do you have problems  with loss of bowel control? N  Managing your Medications? N  Managing your Finances? N  Housekeeping or managing your Housekeeping? N  Some recent data might be hidden    Patient Care Team: Mar Daring, PA-C as PCP - General (Family Medicine) Murlean Iba, MD (Nephrology) Earnestine Leys, MD (Orthopedic Surgery) Carloyn Manner, MD as Referring Physician (Otolaryngology) Jannet Mantis, MD (Dermatology)  Indicate any recent Medical Services you may have received from other than [facility] providers in the past year (date may be approximate).     Assessment:   This is a routine wellness examination for [place]i.  Hearing/Vision screen No results found.  Dietary issues and exercise activities discussed: Current Exercise Habits: Home exercise routine, Type of exercise: walking, Time (Minutes): 50, Frequency (Times/Week): 4, Weekly Exercise (Minutes/Week): 200, Intensity: Mild, Exercise limited by: None identified   Goals Addressed             This Visit's Progress    DIET - EAT MORE FRUITS AND VEGETABLES         Depression Screen PHQ 2/9 Scores 05/12/2021 09/07/2020 09/16/2019 07/18/2018 08/04/2016  PHQ - 2 Score 0 0 0 0 0  PHQ- 9 Score - 0 - 0 0    Fall Risk Fall Risk  05/12/2021 03/08/2021 09/16/2019 07/18/2018 07/18/2018  Falls in the past year? 0 0 1 1 1   Number  falls in past yr: 0 0 0 0 0  Injury with Fall? 0 - 1 1 0  Risk for fall due to : No Fall Risks - - - -  Follow up Falls prevention discussed Falls prevention discussed Falls prevention discussed - -    FALL RISK PREVENTION PERTAINING TO THE HOME:  Any stairs in or around the home? Yes  If so, are there any without handrails? No  Home free of loose throw rugs in walkways, pet beds, electrical cords, etc? Yes  Adequate lighting in your home to reduce risk of falls? Yes   ASSISTIVE DEVICES UTILIZED TO PREVENT FALLS:  Life alert? No  Use of a cane, walker or w/c? No  Grab bars in the bathroom? Yes  Shower chair or bench in shower? Yes  Elevated toilet seat or a handicapped toilet? Yes   TIMED UP AND GO:  Was the test performed? No .    Cognitive Function:Normal cognitive status assessed by direct observation by this Nurse Health Advisor. No abnormalities found.          Immunizations Immunization History  Administered Date(s) Administered   Influenza Split 04/19/2006, 07/17/2009   Influenza, High Dose Seasonal PF 10/08/2018   Influenza,inj,Quad PF,6+ Mos 02/13/2013, 06/24/2015   Influenza-Unspecified 04/06/2018, 10/08/2018   PFIZER(Purple Top)SARS-COV-2 Vaccination 07/29/2019, 08/20/2019   Pneumococcal Conjugate-13 06/24/2015   Pneumococcal Polysaccharide-23 02/13/2013   Tdap 03/27/2008, 10/08/2018   Zoster, Live 08/03/2012    TDAP status: Up to date  Flu Vaccine status: Declined, Education has been provided regarding the importance of this vaccine but patient still declined. Advised may receive this vaccine at local pharmacy or Health Dept. Aware to provide a copy of the vaccination record if obtained from local pharmacy or Health Dept. Verbalized acceptance and understanding.  Pneumococcal vaccine status: Up to date  Covid-19 vaccine status: Completed vaccines  Qualifies for Shingles Vaccine? No   Zostavax completed Yes   Shingrix Completed?: No.    Education has  been provided regarding the importance of this vaccine. Patient has been advised to call insurance company to determine out of pocket  expense if they have not yet received this vaccine. Advised may also receive vaccine at local pharmacy or Health Dept. Verbalized acceptance and understanding.  Screening Tests Health Maintenance  Topic Date Due   URINE MICROALBUMIN  Never done   Zoster Vaccines- Shingrix (1 of 2) Never done   Pneumonia Vaccine 48+ Years old (3 - PPSV23 if available, else PCV20) 06/28/2018   COVID-19 Vaccine (3 - Booster for Pfizer series) 10/15/2019   INFLUENZA VACCINE  01/04/2021   MAMMOGRAM  01/17/2021   Fecal DNA (Cologuard)  12/09/2021   TETANUS/TDAP  10/07/2028   DEXA SCAN  Completed   Hepatitis C Screening  Completed   HPV VACCINES  Aged Out   COLONOSCOPY (Pts 45-38yrs Insurance coverage will need to be confirmed)  Discontinued    Health Maintenance  Health Maintenance Due  Topic Date Due   URINE MICROALBUMIN  Never done   Zoster Vaccines- Shingrix (1 of 2) Never done   Pneumonia Vaccine 80+ Years old (3 - PPSV23 if available, else PCV20) 06/28/2018   COVID-19 Vaccine (3 - Booster for [place] series) 10/15/2019   INFLUENZA VACCINE  01/04/2021   MAMMOGRAM  01/17/2021    Colorectal cancer screening: Type of screening: Cologuard. Completed 12/10/2018. Repeat every 3 years  Mammogram status: Ordered 05/12/21. Pt provided with contact info and advised to call to schedule appt.   Bone Density status: Ordered 05/12/21. Pt provided with contact info and advised to call to schedule appt.  Lung Cancer Screening: (Low Dose CT Chest recommended if Age 74-80 years, 30 pack-year currently smoking OR have quit w/in 15years.) does not qualify.    Additional Screening:  Hepatitis C Screening: does qualify; Completed 04/25/12  Vision Screening: Recommended annual ophthalmology exams for early detection of glaucoma and other disorders of the eye. Is the patient up to date  with their annual eye exam?  Yes  Who is the provider or what is the name of the office in which the patient attends annual eye exams? [facility] If pt is not established with a provider, would they like to be referred to a provider to establish care? No .   Dental Screening: Recommended annual dental exams for proper oral hygiene  Community Resource Referral / Chronic Care Management: CRR required this visit?  No   CCM required this visit?  No      Plan:     I have personally reviewed and noted the following in the patient's chart:   Medical and social history Use of alcohol, tobacco or illicit drugs  Current medications and supplements including opioid prescriptions.  Functional ability and status Nutritional status Physical activity Advanced directives List of other physicians Hospitalizations, surgeries, and ER visits in previous 12 months Vitals Screenings to include cognitive, depression, and falls Referrals and appointments  In addition, I have reviewed and discussed with patient certain preventive protocols, quality metrics, and best practice recommendations. A written personalized care plan for preventive services as well as general preventive health recommendations were provided to patient.     Lori David, LPN   07/13/7410   Nurse Notes: none

## 2021-05-25 ENCOUNTER — Other Ambulatory Visit: Payer: Self-pay | Admitting: Family Medicine

## 2021-05-25 DIAGNOSIS — S39012A Strain of muscle, fascia and tendon of lower back, initial encounter: Secondary | ICD-10-CM

## 2021-06-16 ENCOUNTER — Ambulatory Visit (INDEPENDENT_AMBULATORY_CARE_PROVIDER_SITE_OTHER): Payer: PPO | Admitting: Family Medicine

## 2021-06-16 DIAGNOSIS — R7989 Other specified abnormal findings of blood chemistry: Secondary | ICD-10-CM

## 2021-06-16 DIAGNOSIS — E034 Atrophy of thyroid (acquired): Secondary | ICD-10-CM

## 2021-06-16 DIAGNOSIS — E7800 Pure hypercholesterolemia, unspecified: Secondary | ICD-10-CM

## 2021-06-16 DIAGNOSIS — Z1211 Encounter for screening for malignant neoplasm of colon: Secondary | ICD-10-CM

## 2021-06-16 DIAGNOSIS — N1831 Chronic kidney disease, stage 3a: Secondary | ICD-10-CM

## 2021-06-16 DIAGNOSIS — R7303 Prediabetes: Secondary | ICD-10-CM

## 2021-06-16 NOTE — Progress Notes (Signed)
Complete physical exam   Patient: [name]   DOB: 1956-04-09   66 y.o. Female  MRN: [id number] Visit Date: 06/16/2021  Today's healthcare provider: Gwyneth Sprout, FNP   No chief complaint on file.  Subjective    [name] is a 66 y.o. female - did not show for exam today.   Past Medical History:  Diagnosis Date   Broken arm    Cervical cancer ([place])    Chronic kidney disease, stage III (moderate) ([place])    Foot fracture 2013   Hiatal hernia    Hypopotassemia    OSA on CPAP    Other abnormal glucose    Pure hypercholesterolemia    Reflux esophagitis    Sleep apnea    Thyroid fullness    Thyroid nodule    Unspecified vitamin D deficiency    Past Surgical History:  Procedure Laterality Date   CHOLECYSTECTOMY     ELBOW FRACTURE SURGERY Right    FOOT SURGERY Left 03/2010   GALLBLADDER SURGERY     GANGLION CYST EXCISION     TOTAL VAGINAL HYSTERECTOMY     Social History   Socioeconomic History   Marital status: Married    Spouse name: Not on file   Number of children: 3   Years of education: Not on file   Highest education level: Some college, no degree  Occupational History    Comment: self employeed  Tobacco Use   Smoking status: Never   Smokeless tobacco: Never  Vaping Use   Vaping Use: Never used  Substance and Sexual Activity   Alcohol use: Yes    Alcohol/week: 0.0 - 1.0 standard drinks   Drug use: No   Sexual activity: Not on file  Other Topics Concern   Not on file  Social History Narrative   Not on file   Social Determinants of Health   Financial Resource Strain: Low Risk    Difficulty of Paying Living Expenses: Not hard at all  Food Insecurity: No Food Insecurity   Worried About Charity fundraiser in the Last Year: Never true   [place] in the Last Year: Never true  Transportation Needs: No Transportation Needs   Lack of Transportation (Medical): No   Lack of Transportation (Non-Medical): No  Physical Activity:  Sufficiently Active   Days of Exercise per Week: 3 days   Minutes of Exercise per Session: 50 min  Stress: No Stress Concern Present   Feeling of Stress : Not at all  Social Connections: Socially Integrated   Frequency of Communication with Friends and Family: More than three times a week   Frequency of Social Gatherings with Friends and Family: More than three times a week   Attends Religious Services: More than 4 times per year   Active Member of [employer] or Organizations: Yes   Attends Archivist Meetings: 1 to 4 times per year   Marital Status: Married  Human resources officer Violence: Not At Risk   Fear of Current or Ex-Partner: No   Emotionally Abused: No   Physically Abused: No   Sexually Abused: No   Family Status  Relation Name Status   Father  Deceased at age 84   Brother  Alive       diabetes mellitus Type II   Mother  Deceased       kidney trouble   MGM  Deceased       breast cancer   Brother  Alive       epilepsy   Daughter  Alive   Daughter  Alive   Daughter  Alive   Family History  Problem Relation Age of Onset   Heart attack Father 56       MI   Heart disease Father    CAD Father    Hypertension Brother    Diabetes Brother    Allergies  Allergen Reactions   Penicillins     Swelling  Hives     Patient Care Team: Rubye Beach as PCP - General (Family Medicine) Murlean Iba, MD (Nephrology) Earnestine Leys, MD (Orthopedic Surgery) Carloyn Manner, MD as Referring Physician (Otolaryngology) Ree Edman, MD (Dermatology)   Medications: Outpatient Medications Prior to Visit  Medication Sig   albuterol (VENTOLIN HFA) 108 (90 Base) MCG/ACT inhaler Inhale 2 puffs into the lungs every 6 (six) hours as needed.   allopurinol (ZYLOPRIM) 100 MG tablet Take 1 tablet (100 mg total) by mouth daily.   clindamycin (CLEOCIN) 300 MG capsule Take 300 mg by mouth 2 (two) times daily.   clobetasol (TEMOVATE) 0.05 % external solution  Apply 1 application topically as needed. Due to CPAP use   cyclobenzaprine (FLEXERIL) 5 MG tablet TAKE 1 TABLET BY MOUTH 3 TIMES DAILY AS MNEEDED FOR MUSCLE SPASMS   fluticasone (FLONASE) 50 MCG/ACT nasal spray USE 2 SPRAYS INTO EACH NOSTRIL DAILY   furosemide (LASIX) 40 MG tablet TAKE 1 TABLET BY MOUTH TWICE DAILY   hydrOXYzine (ATARAX/VISTARIL) 25 MG tablet Take 1 tablet (25 mg total) by mouth 3 (three) times daily as needed.   ibuprofen (IBU) 600 MG tablet TAKE 1 TABLET BY MOUTH EVERY 8 HOURS AS NEEDED.   levothyroxine (SYNTHROID) 50 MCG tablet Take 50 mcg by mouth every morning.   levothyroxine (SYNTHROID) 75 MCG tablet Take 1 tablet (75 mcg total) by mouth daily.   montelukast (SINGULAIR) 10 MG tablet Take 1 tablet (10 mg total) by mouth at bedtime.   NON FORMULARY CPAP nightly.   olopatadine (PATANOL) 0.1 % ophthalmic solution Place 1 drop into both eyes daily. As needed   Omega-3 Fatty Acids (FISH OIL) 1200 MG CAPS Take 2 capsules twice a day.   potassium chloride (KLOR-CON) 10 MEQ tablet TAKE TWO TABLETS TWICE DAILY   Vitamin D, Ergocalciferol, (DRISDOL) 1.25 MG (50000 UNIT) CAPS capsule Take 1 capsule (50,000 Units total) by mouth once a week.   No facility-administered medications prior to visit.    Review of Systems    Objective    There were no vitals taken for this visit.   Physical Exam    Last depression screening scores PHQ 2/9 Scores 05/12/2021 09/07/2020 09/16/2019  PHQ - 2 Score 0 0 0  PHQ- 9 Score - 0 -   Last fall risk screening Fall Risk  05/12/2021  Falls in the past year? 0  Number falls in past yr: 0  Injury with Fall? 0  Risk for fall due to : No Fall Risks  Follow up Falls prevention discussed   Last Audit-C alcohol use screening Alcohol Use Disorder Test (AUDIT) 05/12/2021  1. How often do you have a drink containing alcohol? 2  2. How many drinks containing alcohol do you have on a typical day when you are drinking? 0  3. How often do you have six  or more drinks on one occasion? 0  AUDIT-C Score 2  Alcohol Brief Interventions/Follow-up -   A score of 3 or more in women, and 4  or more in men indicates increased risk for alcohol abuse, EXCEPT if all of the points are from question 1   No results found for any visits on 06/16/21.  Assessment & Plan    Routine Health Maintenance and Physical Exam  Exercise Activities and Dietary recommendations  Goals      DIET - EAT MORE FRUITS AND VEGETABLES     Reduce caffeine intake     Recommend to continue to cut back on sweet tea intake and increase water intake .         Immunization History  Administered Date(s) Administered   Influenza Split 04/19/2006, 07/17/2009   Influenza, High Dose Seasonal PF 10/08/2018   Influenza,inj,Quad PF,6+ Mos 02/13/2013, 06/24/2015   Influenza-Unspecified 04/06/2018, 10/08/2018   PFIZER(Purple Top)SARS-COV-2 Vaccination 07/29/2019, 08/20/2019   Pneumococcal Conjugate-13 06/24/2015   Pneumococcal Polysaccharide-23 02/13/2013   Tdap 03/27/2008, 10/08/2018   Zoster, Live 08/03/2012    Health Maintenance  Topic Date Due   URINE MICROALBUMIN  Never done   Zoster Vaccines- Shingrix (1 of 2) Never done   Pneumonia Vaccine 66+ Years old (3 - PPSV23 if available, else PCV20) 06/28/2018   COVID-19 Vaccine (3 - Booster for Pfizer series) 10/15/2019   MAMMOGRAM  01/17/2021   INFLUENZA VACCINE  09/04/2021 (Originally 01/04/2021)   Fecal DNA (Cologuard)  12/09/2021   TETANUS/TDAP  10/07/2028   DEXA SCAN  Completed   Hepatitis C Screening  Completed   HPV VACCINES  Aged Out   COLONOSCOPY (Pts 45-55yrs Insurance coverage will need to be confirmed)  Discontinued    Discussed health benefits of physical activity, and encouraged her to engage in regular exercise appropriate for her age and condition.    No follow-ups on file.     Vonna Kotyk, FNP, have reviewed all documentation for this visit. The documentation on 06/17/21 for the exam, diagnosis,  procedures, and orders are all accurate and complete.    Gwyneth Sprout, [place] [phone] (phone) [phone] (fax)  [place]

## 2021-06-21 ENCOUNTER — Telehealth: Payer: Self-pay | Admitting: Cardiovascular Disease

## 2021-06-21 NOTE — Telephone Encounter (Signed)
Was able to return call to [name] to f/u on her BP and dizziness concerns. Pt reports not prior issues/hx of dizziness of BP concerns, currently no on BP meds. Pt reports dizziness when she is bent over and comes back up to a standing position.  Pt has appt Wed 1/18 with Dr. Rockey Situ at 09:20 am  Until appt, have advised to check her BP daily and record log.   Educated pt on taking her BP and to be at rest for 15-20 mins while taking How to use a home blood pressure monitor. Be still. Measure at the same time every day. Its important to take the readings at the same time each day, such as morning and evening.  AVOID these things for 30 minutes before checking your blood pressure: Drinking caffeine. Drinking alcohol. Eating. Smoking. Exercising.  Also requested to take her BP with postural changes especially when dizzy, check BP while sitting then again while standing and record those numbers. When dizzy, needs to rest, move slow, no sudden movement and don't bend over for long periods of time. [name] verbalized understanding.  At this time all questions were address and no additional concerns at this time. [name] thankful for the return call and advice. Agreeable to plan, will be appt Wed with BP log.

## 2021-06-21 NOTE — Telephone Encounter (Signed)
Pt c/o BP issue: STAT if pt c/o blurred vision, one-sided weakness or slurred speech  1. What are your last 5 BP readings? 159/91, 161/85, 158/91, 156/85, 146/79  2. Are you having any other symptoms (ex. Dizziness, headache, blurred vision, passed out)? Slight headache, bent over felt dizzy  3. What is your BP issue? Going on for several days

## 2021-06-23 ENCOUNTER — Other Ambulatory Visit: Payer: Self-pay

## 2021-06-23 ENCOUNTER — Ambulatory Visit: Payer: PPO | Admitting: Cardiovascular Disease

## 2021-06-23 ENCOUNTER — Encounter: Payer: Self-pay | Admitting: Cardiovascular Disease

## 2021-06-23 VITALS — BP 152/80 | HR 66 | Ht 68.0 in | Wt 243.1 lb

## 2021-06-23 DIAGNOSIS — E782 Mixed hyperlipidemia: Secondary | ICD-10-CM

## 2021-06-23 DIAGNOSIS — I10 Essential (primary) hypertension: Secondary | ICD-10-CM | POA: Diagnosis not present

## 2021-06-23 DIAGNOSIS — E119 Type 2 diabetes mellitus without complications: Secondary | ICD-10-CM

## 2021-06-23 DIAGNOSIS — N1831 Chronic kidney disease, stage 3a: Secondary | ICD-10-CM | POA: Diagnosis not present

## 2021-06-23 DIAGNOSIS — Z794 Long term (current) use of insulin: Secondary | ICD-10-CM | POA: Diagnosis not present

## 2021-06-23 MED ORDER — LOSARTAN POTASSIUM 50 MG PO TABS: 50.0000 mg | ORAL_TABLET | Freq: Every day | ORAL | 3 refills | Status: DC

## 2021-06-23 NOTE — Patient Instructions (Addendum)
Medication Instructions:  Please START Losartan Start 25 mg daily for 2 weeks If blood pressure remains elevated, increase losartan up to to 50 mg daily  If you need a refill on your cardiac medications before your next appointment, please call your pharmacy.   Lab work: No new labs needed  Testing/Procedures: No new testing needed  Follow-Up: At [facility], you and your health needs are our priority.  As part of our continuing mission to provide you with exceptional heart care, we have created designated Provider Care Teams.  These Care Teams include your primary Cardiologist (physician) and Advanced Practice Providers (APPs -  Physician Assistants and Nurse Practitioners) who all work together to provide you with the care you need, when you need it.  You will need a follow up appointment in 12 months  Providers on your designated Care Team:   Murray Hodgkins, NP Christell Faith, PA-C Cadence Kathlen Mody, [place]  COVID-19 Vaccine Information can be found at: [email or web address] For questions related to vaccine distribution or appointments, please email [email or web address] or call [phone].

## 2021-06-23 NOTE — Progress Notes (Signed)
Cardiology Office Note  Date:  06/23/2021   ID:  [name], DOB 1955/11/25, MRN [id number]  PCP:  Lori Daring, PA-C   Chief Complaint  Patient presents with   6 month follow up     Patient c/o elevated blood pressure. Medications reviewed by the patient verbally.     HPI:  [name] is a 66 year old woman with past medical history of  chest pain, shortness of breath, abnormal stress test, strong family history of coronary artery disease, obesity  Seen previously by cardiology in 2014 Who presents abnormal EKG, hyperlipidemia  LOV 5/22 Recent phone call June 21, 2021, blood pressure elevated 734 up to 193 systolic over 79K dizziness when she is bent over and comes back up to a standing position  On further discussion, she reports having what felt like a Vertigo episode last Thursday, got out of bed, did not feel right, reaching for her glasses on the floor, fell over onto the floor  Lay there for a while until she was able to get up did Epley's maneuver on the bed given history of vertigo and what she remembered as appropriate therapy Able to get through the day, Hurt back at the time  High BP since then Drinking more water Still when laying down to night, laying on side, little vertigo/dizziness, has to lay on her back head straight and symptoms improve  Blood pressures sometimes up to 240 up to 973 systolic, past day or 2 may have improved some 140s Mildly elevated on today's visit 150  Lab work reviewed A1c 6.4 Total cholesterol 191 GFR 53  Weight continues to run high,  Very busy at work, does freight/shipping coordination  Continues on Lasix daily Mildly elevated renal function Previously followed by nephrology  EKG personally reviewed by myself on todays visit Normal sinus rhythm rate 66 bpm no significant ST-T wave changes  Prior records reviewed Cardiac CTA 2014 with no coronary disease, no coronary calcification  Other past medical  history reviewed Prior cardiac CT scan November 2008 showing coronary calcium score of 0, no significant coronary artery disease, normal LV and RV size and systolic function  Prior echocardiogram September 2011 was essentially normal with ejection fraction greater than 55%, normal right ventricular systolic pressure   PMH:   has a past medical history of Broken arm, Cervical cancer ([place]), Chronic kidney disease, stage III (moderate) ([place]), Foot fracture (2013), Hiatal hernia, Hypopotassemia, OSA on CPAP, Other abnormal glucose, Pure hypercholesterolemia, Reflux esophagitis, Sleep apnea, Thyroid fullness, Thyroid nodule, and Unspecified vitamin D deficiency.  PSH:    Past Surgical History:  Procedure Laterality Date   CHOLECYSTECTOMY     ELBOW FRACTURE SURGERY Right    FOOT SURGERY Left 03/2010   GALLBLADDER SURGERY     GANGLION CYST EXCISION     TOTAL VAGINAL HYSTERECTOMY      Current Outpatient Medications  Medication Sig Dispense Refill   albuterol (VENTOLIN HFA) 108 (90 Base) MCG/ACT inhaler Inhale 2 puffs into the lungs every 6 (six) hours as needed. 18 g 1   allopurinol (ZYLOPRIM) 100 MG tablet Take 1 tablet (100 mg total) by mouth daily. 90 tablet 3   clindamycin (CLEOCIN) 300 MG capsule Take 300 mg by mouth 2 (two) times daily.     clobetasol (TEMOVATE) 0.05 % external solution Apply 1 application topically as needed. Due to CPAP use     cyclobenzaprine (FLEXERIL) 5 MG tablet TAKE 1 TABLET BY MOUTH 3 TIMES DAILY AS MNEEDED FOR  MUSCLE SPASMS 90 tablet 3   fluticasone (FLONASE) 50 MCG/ACT nasal spray USE 2 SPRAYS INTO EACH NOSTRIL DAILY 48 g 1   furosemide (LASIX) 40 MG tablet TAKE 1 TABLET BY MOUTH TWICE DAILY 180 tablet 1   hydrOXYzine (ATARAX/VISTARIL) 25 MG tablet Take 1 tablet (25 mg total) by mouth 3 (three) times daily as needed. 90 tablet 3   ibuprofen (IBU) 600 MG tablet TAKE 1 TABLET BY MOUTH EVERY 8 HOURS AS NEEDED. 30 tablet 0   levothyroxine (SYNTHROID) 50 MCG tablet  Take 50 mcg by mouth every morning.     levothyroxine (SYNTHROID) 75 MCG tablet Take 1 tablet (75 mcg total) by mouth daily. 90 tablet 3   losartan (COZAAR) 50 MG tablet Take 1 tablet (50 mg total) by mouth daily. 90 tablet 3   montelukast (SINGULAIR) 10 MG tablet Take 1 tablet (10 mg total) by mouth at bedtime. 90 tablet 3   NON FORMULARY CPAP nightly.     olopatadine (PATANOL) 0.1 % ophthalmic solution Place 1 drop into both eyes daily. As needed 5 mL 5   Omega-3 Fatty Acids (FISH OIL) 1200 MG CAPS Take 2 capsules twice a day.     potassium chloride (KLOR-CON) 10 MEQ tablet TAKE TWO TABLETS TWICE DAILY 120 tablet 5   Vitamin D, Ergocalciferol, (DRISDOL) 1.25 MG (50000 UNIT) CAPS capsule Take 1 capsule (50,000 Units total) by mouth once a week. 12 capsule 3   No current facility-administered medications for this visit.   Allergies:   Penicillins   Social History:  The patient  reports that she has never smoked. She has never used smokeless tobacco. She reports current alcohol use. She reports that she does not use drugs.   Family History:   family history includes CAD in her father; Diabetes in her brother; Heart attack (age of onset: 55) in her father; Heart disease in her father; Hypertension in her brother.   Review of Systems: Review of Systems  Constitutional: Negative.   HENT: Negative.    Respiratory: Negative.    Cardiovascular: Negative.   Gastrointestinal: Negative.   Musculoskeletal: Negative.   Neurological: Negative.   Psychiatric/Behavioral: Negative.    All other systems reviewed and are negative.  PHYSICAL EXAM: VS:  BP (!) 152/80 (BP Location: Left Arm, Patient Position: Sitting, Cuff Size: Normal)    Pulse 66    Ht 5\' 8"  (1.727 m)    Wt 243 lb 2 oz (110.3 kg)    SpO2 98%    BMI 36.97 kg/m  , BMI Body mass index is 36.97 kg/m. Constitutional:  oriented to person, place, and time. No distress.  HENT:  Head: Grossly normal Eyes:  no discharge. No scleral icterus.   Neck: No JVD, no carotid bruits  Cardiovascular: Regular rate and rhythm, no murmurs appreciated Pulmonary/Chest: Clear to auscultation bilaterally, no wheezes or rails Abdominal: Soft.  no distension.  no tenderness.  Musculoskeletal: Normal range of motion Neurological:  normal muscle tone. Coordination normal. No atrophy Skin: Skin warm and dry Psychiatric: normal affect, pleasant  Recent Labs: 09/07/2020: ALT 21; BUN 18; Creatinine, Ser 1.13; Hemoglobin 12.5; Platelets 211; Potassium 4.5; Sodium 143; TSH 4.770    Lipid Panel Lab Results  Component Value Date   CHOL 191 09/07/2020   HDL 59 09/07/2020   LDLCALC 115 (H) 09/07/2020   TRIG 95 09/07/2020      Wt Readings from Last 3 Encounters:  06/23/21 243 lb 2 oz (110.3 kg)  10/06/20 237 lb 6  oz (107.7 kg)  09/07/20 240 lb (108.9 kg)    ASSESSMENT AND PLAN:  Problem List Items Addressed This Visit     Chronic kidney disease (CKD), stage III (moderate) ([place])   Other Visit Diagnoses     Type 2 diabetes mellitus without complication, with long-term current use of insulin ([place])    -  Primary   Relevant Medications   losartan (COZAAR) 50 MG tablet   Other Relevant Orders   EKG 12-Lead   Essential hypertension       Relevant Medications   losartan (COZAAR) 50 MG tablet   Other Relevant Orders   EKG 12-Lead   Mixed hyperlipidemia       Relevant Medications   losartan (COZAAR) 50 MG tablet      Essential hypertension Blood pressure elevated on today's visit Given history of renal dysfunction, recommended that she start losartan 25 mg daily with possible titration up to 50 mg daily for aggressive blood pressure control She will monitor blood pressures at home and call us with numbers  Near syncope/vertigo Symptoms concerning for vertigo, appear to have got much better since last Thursday Less likely TIA or stroke though this was discussed For any recurrent symptoms, recommend she call our  office  Hyperlipidemia Lifestyle modification at this time In the past preferred not to be on statin Prior cardiac CTA no significant disease no no recent work-up  Diabetes type 2 without complications We have encouraged continued exercise, careful diet management in an effort to lose weight. A1c trending higher 6.4   Total encounter time more than 35 minutes  Greater than 50% was spent in counseling and coordination of care with the patient    Signed, Esmond Plants, M.D., Ph.D. [place], [place]

## 2021-06-24 ENCOUNTER — Encounter: Payer: PPO | Admitting: Physician Assistant

## 2021-06-28 DIAGNOSIS — L570 Actinic keratosis: Secondary | ICD-10-CM | POA: Diagnosis not present

## 2021-06-28 DIAGNOSIS — L821 Other seborrheic keratosis: Secondary | ICD-10-CM | POA: Diagnosis not present

## 2021-06-28 DIAGNOSIS — Z85828 Personal history of other malignant neoplasm of skin: Secondary | ICD-10-CM | POA: Diagnosis not present

## 2021-06-28 DIAGNOSIS — Z872 Personal history of diseases of the skin and subcutaneous tissue: Secondary | ICD-10-CM | POA: Diagnosis not present

## 2021-06-28 DIAGNOSIS — L2389 Allergic contact dermatitis due to other agents: Secondary | ICD-10-CM | POA: Diagnosis not present

## 2021-06-28 DIAGNOSIS — L578 Other skin changes due to chronic exposure to nonionizing radiation: Secondary | ICD-10-CM | POA: Diagnosis not present

## 2021-06-28 DIAGNOSIS — Z86018 Personal history of other benign neoplasm: Secondary | ICD-10-CM | POA: Diagnosis not present

## 2021-07-14 ENCOUNTER — Other Ambulatory Visit: Payer: PPO

## 2021-07-14 ENCOUNTER — Telehealth: Payer: Self-pay

## 2021-07-14 NOTE — Telephone Encounter (Signed)
Copied from [place] [id number]. Topic: General - Other >> Jul 14, 2021  3:45 PM McGill, Nelva Bush wrote: Reason for CRM: Pt requesting call back in regards to physical. Pt stated she wants to know exactly what will be done or discussed at the physical appointment because she has an AWV scheduled in December.  Per pt request, I canceled her future appointment.  Pt requesting call back.

## 2021-07-16 ENCOUNTER — Encounter: Payer: PPO | Admitting: Physician Assistant

## 2021-08-23 ENCOUNTER — Ambulatory Visit
Admission: RE | Admit: 2021-08-23 | Discharge: 2021-08-23 | Disposition: A | Discharge status: Home / Self Care | Payer: PPO | Source: Ambulatory Visit | Attending: Family Medicine | Admitting: Family Medicine

## 2021-08-23 ENCOUNTER — Other Ambulatory Visit: Payer: Self-pay

## 2021-08-23 DIAGNOSIS — Z780 Asymptomatic menopausal state: Secondary | ICD-10-CM | POA: Insufficient documentation

## 2021-08-23 DIAGNOSIS — Z1231 Encounter for screening mammogram for malignant neoplasm of breast: Secondary | ICD-10-CM | POA: Diagnosis not present

## 2021-08-24 NOTE — Progress Notes (Signed)
Will forward to PCP. Ordered by [facility]

## 2021-08-26 ENCOUNTER — Other Ambulatory Visit: Payer: Self-pay | Admitting: Physician Assistant

## 2021-08-26 ENCOUNTER — Other Ambulatory Visit: Payer: Self-pay | Admitting: Family Medicine

## 2021-08-26 DIAGNOSIS — S39012A Strain of muscle, fascia and tendon of lower back, initial encounter: Secondary | ICD-10-CM

## 2021-08-26 DIAGNOSIS — J301 Allergic rhinitis due to pollen: Secondary | ICD-10-CM

## 2021-09-10 ENCOUNTER — Other Ambulatory Visit: Payer: Self-pay | Admitting: Family Medicine

## 2021-09-10 DIAGNOSIS — M1A09X0 Idiopathic chronic gout, multiple sites, without tophus (tophi): Secondary | ICD-10-CM

## 2021-09-10 DIAGNOSIS — E034 Atrophy of thyroid (acquired): Secondary | ICD-10-CM

## 2021-09-10 DIAGNOSIS — J4520 Mild intermittent asthma, uncomplicated: Secondary | ICD-10-CM

## 2021-09-10 DIAGNOSIS — J301 Allergic rhinitis due to pollen: Secondary | ICD-10-CM

## 2021-09-10 NOTE — Telephone Encounter (Signed)
Requested medication (s) are due for refill today: yes ? ?Requested medication (s) are on the active medication list: yes ? ?Last refill:  09/07/20 #90/3, 09/10/20 #90/3 ? ?Future visit scheduled: no ? ?Notes to clinic:  Unable to refill per protocol due to failed labs, no updated results. ? ? ?  ?Requested Prescriptions  ?Pending Prescriptions Disp Refills  ? allopurinol (ZYLOPRIM) 100 MG tablet [Pharmacy Med Name: ALLOPURINOL 100 MG TAB] 90 tablet 3  ?  Sig: TAKE 1 TABLET BY MOUTH ONCE DAILY  ?  ? Endocrinology:  Gout Agents - allopurinol Failed - 09/10/2021 11:53 AM  ?  ?  Failed - Uric Acid in normal range and within 360 days  ?  Uric Acid  ?Date Value Ref Range Status  ?09/16/2016 8.4 (H) 2.5 - 7.1 mg/dL Final  ?  Comment:  ?             Therapeutic target for gout patients: <6.0  ?  ?  ?  ?  Failed - Cr in normal range and within 360 days  ?  Creatinine, Ser  ?Date Value Ref Range Status  ?09/07/2020 1.13 (H) 0.57 - 1.00 mg/dL Final  ?  ?  ?  ?  Failed - CBC within normal limits and completed in the last 12 months  ?  WBC  ?Date Value Ref Range Status  ?09/07/2020 5.7 3.4 - 10.8 x10E3/uL Final  ? ?RBC  ?Date Value Ref Range Status  ?09/07/2020 4.51 3.77 - 5.28 x10E6/uL Final  ? ?Hemoglobin  ?Date Value Ref Range Status  ?09/07/2020 12.5 11.1 - 15.9 g/dL Final  ? ?Hematocrit  ?Date Value Ref Range Status  ?09/07/2020 38.1 34.0 - 46.6 % Final  ? ?MCHC  ?Date Value Ref Range Status  ?09/07/2020 32.8 31.5 - 35.7 g/dL Final  ? ?MCH  ?Date Value Ref Range Status  ?09/07/2020 27.7 26.6 - 33.0 pg Final  ? ?MCV  ?Date Value Ref Range Status  ?09/07/2020 85 79 - 97 fL Final  ? ?No results found for: PLTCOUNTKUC, LABPLAT, [place] ?RDW  ?Date Value Ref Range Status  ?09/07/2020 13.7 11.7 - 15.4 % Final  ? ?  ?  ?  Passed - Valid encounter within last 12 months  ?  Recent Outpatient Visits   ? ?      ? 2 months ago Elevated serum creatinine  ? [facility] Tally Joe T, FNP  ? 1 year ago Mild intermittent asthma  without complication  ? [place], [place]  ? 1 year ago Strain of lumbar region, initial encounter  ? [place], [place]  ? 1 year ago Bacterial conjunctivitis  ? [facility] [place], Clearnce Sorrel, [place]  ? 1 year ago Annual physical exam  ? [facility] Fenton Malling M, [place]  ? ?  ?  ? ?  ?  ?  ? levothyroxine (SYNTHROID) 75 MCG tablet [Pharmacy Med Name: LEVOTHYROXINE SODIUM 75 MCG TAB] 90 tablet 3  ?  Sig: TAKE 1 TABLET BY MOUTH DAILY.  ?  ? Endocrinology:  Hypothyroid Agents Failed - 09/10/2021 11:53 AM  ?  ?  Failed - TSH in normal range and within 360 days  ?  TSH  ?Date Value Ref Range Status  ?09/07/2020 4.770 (H) 0.450 - 4.500 uIU/mL Final  ?  ?  ?  ?  Passed - Valid encounter within last 12 months  ?  Recent Outpatient Visits   ? ?      ?  2 months ago Elevated serum creatinine  ? [facility] Tally Joe T, FNP  ? 1 year ago Mild intermittent asthma without complication  ? [place], [place]  ? 1 year ago Strain of lumbar region, initial encounter  ? [place], [place]  ? 1 year ago Bacterial conjunctivitis  ? [facility] [place], Clearnce Sorrel, [place]  ? 1 year ago Annual physical exam  ? [facility] Fenton Malling M, [place]  ? ?  ?  ? ?  ?  ?  ?Signed Prescriptions Disp Refills  ? montelukast (SINGULAIR) 10 MG tablet 90 tablet 0  ?  Sig: TAKE 1 TABLET BY MOUTH AT BEDTIME  ?  ? Pulmonology:  Leukotriene Inhibitors Passed - 09/10/2021 11:53 AM  ?  ?  Passed - Valid encounter within last 12 months  ?  Recent Outpatient Visits   ? ?      ? 2 months ago Elevated serum creatinine  ? [facility] Tally Joe T, FNP  ? 1 year ago Mild intermittent asthma without complication  ? [place], [place]  ? 1 year ago Strain of lumbar region, initial encounter  ?  [place], [place]  ? 1 year ago Bacterial conjunctivitis  ? [facility] [place], Clearnce Sorrel, [place]  ? 1 year ago Annual physical exam  ? [facility] Fenton Malling M, [place]  ? ?  ?  ? ?  ?  ?  ? ?

## 2021-09-10 NOTE — Telephone Encounter (Signed)
Requested Prescriptions  ?Pending Prescriptions Disp Refills  ?? allopurinol (ZYLOPRIM) 100 MG tablet [Pharmacy Med Name: ALLOPURINOL 100 MG TAB] 90 tablet 3  ?  Sig: TAKE 1 TABLET BY MOUTH ONCE DAILY  ?  ? Endocrinology:  Gout Agents - allopurinol Failed - 09/10/2021 11:53 AM  ?  ?  Failed - Uric Acid in normal range and within 360 days  ?  Uric Acid  ?Date Value Ref Range Status  ?09/16/2016 8.4 (H) 2.5 - 7.1 mg/dL Final  ?  Comment:  ?             Therapeutic target for gout patients: <6.0  ?   ?  ?  Failed - Cr in normal range and within 360 days  ?  Creatinine, Ser  ?Date Value Ref Range Status  ?09/07/2020 1.13 (H) 0.57 - 1.00 mg/dL Final  ?   ?  ?  Failed - CBC within normal limits and completed in the last 12 months  ?  WBC  ?Date Value Ref Range Status  ?09/07/2020 5.7 3.4 - 10.8 x10E3/uL Final  ? ?RBC  ?Date Value Ref Range Status  ?09/07/2020 4.51 3.77 - 5.28 x10E6/uL Final  ? ?Hemoglobin  ?Date Value Ref Range Status  ?09/07/2020 12.5 11.1 - 15.9 g/dL Final  ? ?Hematocrit  ?Date Value Ref Range Status  ?09/07/2020 38.1 34.0 - 46.6 % Final  ? ?MCHC  ?Date Value Ref Range Status  ?09/07/2020 32.8 31.5 - 35.7 g/dL Final  ? ?MCH  ?Date Value Ref Range Status  ?09/07/2020 27.7 26.6 - 33.0 pg Final  ? ?MCV  ?Date Value Ref Range Status  ?09/07/2020 85 79 - 97 fL Final  ? ?No results found for: PLTCOUNTKUC, LABPLAT, [place] ?RDW  ?Date Value Ref Range Status  ?09/07/2020 13.7 11.7 - 15.4 % Final  ? ?  ?  ?  Passed - Valid encounter within last 12 months  ?  Recent Outpatient Visits   ?      ? 2 months ago Elevated serum creatinine  ? [facility] Tally Joe T, FNP  ? 1 year ago Mild intermittent asthma without complication  ? [place], [place]  ? 1 year ago Strain of lumbar region, initial encounter  ? [place], [place]  ? 1 year ago Bacterial conjunctivitis  ? [facility] [place], Clearnce Sorrel, [place]  ? 1 year ago  Annual physical exam  ? [facility] Fenton Malling M, [place]  ?  ?  ? ?  ?  ?  ?? montelukast (SINGULAIR) 10 MG tablet [Pharmacy Med Name: MONTELUKAST SODIUM 10 MG TAB] 90 tablet 3  ?  Sig: TAKE 1 TABLET BY MOUTH AT BEDTIME  ?  ? Pulmonology:  Leukotriene Inhibitors Passed - 09/10/2021 11:53 AM  ?  ?  Passed - Valid encounter within last 12 months  ?  Recent Outpatient Visits   ?      ? 2 months ago Elevated serum creatinine  ? [facility] Tally Joe T, FNP  ? 1 year ago Mild intermittent asthma without complication  ? [place], [place]  ? 1 year ago Strain of lumbar region, initial encounter  ? [place], [place]  ? 1 year ago Bacterial conjunctivitis  ? [facility] [place], Clearnce Sorrel, [place]  ? 1 year ago Annual physical exam  ? [facility] Fenton Malling M, [place]  ?  ?  ? ?  ?  ?  ??  levothyroxine (SYNTHROID) 75 MCG tablet [Pharmacy Med Name: LEVOTHYROXINE SODIUM 75 MCG TAB] 90 tablet 3  ?  Sig: TAKE 1 TABLET BY MOUTH DAILY.  ?  ? Endocrinology:  Hypothyroid Agents Failed - 09/10/2021 11:53 AM  ?  ?  Failed - TSH in normal range and within 360 days  ?  TSH  ?Date Value Ref Range Status  ?09/07/2020 4.770 (H) 0.450 - 4.500 uIU/mL Final  ?   ?  ?  Passed - Valid encounter within last 12 months  ?  Recent Outpatient Visits   ?      ? 2 months ago Elevated serum creatinine  ? [facility] Tally Joe T, FNP  ? 1 year ago Mild intermittent asthma without complication  ? [place], [place]  ? 1 year ago Strain of lumbar region, initial encounter  ? [place], [place]  ? 1 year ago Bacterial conjunctivitis  ? [facility] [place], Clearnce Sorrel, [place]  ? 1 year ago Annual physical exam  ? [facility] [place] Fenton Malling M, [place]  ?  ?  ? ?  ?  ?  ? ?

## 2021-09-14 ENCOUNTER — Other Ambulatory Visit: Payer: Self-pay | Admitting: Family Medicine

## 2021-09-14 MED ORDER — LEVOTHYROXINE SODIUM 50 MCG PO TABS: 50.0000 ug | ORAL_TABLET | Freq: Every morning | ORAL | 0 refills | Status: DC

## 2021-09-27 ENCOUNTER — Telehealth: Payer: Self-pay | Admitting: Family Medicine

## 2021-09-27 NOTE — Telephone Encounter (Signed)
Pt is in need of a medication refill appt. Pt is declining to see Daneil Dan and has changed providers 4 times. Pt is wanting to be assigned to Dr. Jacinto Reap. ? ?Pt also indicates that her levothyroxine (SYNTHROID) 50 MCG tablet [[id number]] prescribtion was lowered. Pt states she was on '75mg'$ . Pt is aware that a medication refill appt is needed. Awaiting approval for PCP ? ?CB- [phone] ?

## 2021-09-27 NOTE — Telephone Encounter (Signed)
Medication for the 77mg was just sent in on 09/14/21, patient is aware she is due for follow up appt. Patient is wanting to changed PCP see message below, as far as I knew only APP were accepting new patients. Dr. BBrita Rompis out of town this week, will forward to her team to review to change PCP when Dr. BBrita Romphas returned.KW ?

## 2021-10-04 ENCOUNTER — Other Ambulatory Visit: Payer: Self-pay | Admitting: Family Medicine

## 2021-10-04 MED ORDER — LEVOTHYROXINE SODIUM 50 MCG PO TABS: 50.0000 ug | ORAL_TABLET | Freq: Every morning | ORAL | 0 refills | Status: DC

## 2021-10-04 NOTE — Telephone Encounter (Signed)
Patient advised. She reports she just had labs done with cardiology Dr. Rockey Situ. Patient advised no labs are showing up on epic or provider notes. Patient reports she also see specialist that takes care of her thyroid. She will gather information of doctors names.  ?

## 2021-10-04 NOTE — Telephone Encounter (Signed)
We want patient to stay on her synthroid. We should fill it until she can get in for an appointment or labs that she is due for.  Please send refill.  She is a former [employer] patient that has only seen Daneil Dan since Betances left and has not switched 4 times.  She can see either of the other APPs.  I am not able to take on new patients (to me) at this time. ?

## 2021-12-02 ENCOUNTER — Other Ambulatory Visit: Payer: Self-pay | Admitting: Family Medicine

## 2021-12-02 ENCOUNTER — Telehealth: Payer: Self-pay | Admitting: Family Medicine

## 2021-12-02 DIAGNOSIS — J4520 Mild intermittent asthma, uncomplicated: Secondary | ICD-10-CM

## 2021-12-02 DIAGNOSIS — J301 Allergic rhinitis due to pollen: Secondary | ICD-10-CM

## 2021-12-02 DIAGNOSIS — M1A09X0 Idiopathic chronic gout, multiple sites, without tophus (tophi): Secondary | ICD-10-CM

## 2021-12-02 DIAGNOSIS — E559 Vitamin D deficiency, unspecified: Secondary | ICD-10-CM

## 2021-12-02 NOTE — Telephone Encounter (Signed)
Requested medication (s) are due for refill today: yes  Requested medication (s) are on the active medication list: yes  Last refill:  vit D and KCL 09/07/20, allopurinol 09/14/21 #90/1  Future visit scheduled: no  Notes to clinic:  Unable to refill per protocol due to failed labs, no updated results.    Requested Prescriptions  Pending Prescriptions Disp Refills   allopurinol (ZYLOPRIM) 100 MG tablet [Pharmacy Med Name: ALLOPURINOL 100 MG TAB] 90 tablet 1    Sig: TAKE 1 TABLET BY MOUTH ONCE DAILY     Endocrinology:  Gout Agents - allopurinol Failed - 12/02/2021  8:55 AM      Failed - Uric Acid in normal range and within 360 days    Uric Acid  Date Value Ref Range Status  09/16/2016 8.4 (H) 2.5 - 7.1 mg/dL Final    Comment:               Therapeutic target for gout patients: <6.0         Failed - Cr in normal range and within 360 days    Creatinine, Ser  Date Value Ref Range Status  09/07/2020 1.13 (H) 0.57 - 1.00 mg/dL Final         Failed - CBC within normal limits and completed in the last 12 months    WBC  Date Value Ref Range Status  09/07/2020 5.7 3.4 - 10.8 x10E3/uL Final   RBC  Date Value Ref Range Status  09/07/2020 4.51 3.77 - 5.28 x10E6/uL Final   Hemoglobin  Date Value Ref Range Status  09/07/2020 12.5 11.1 - 15.9 g/dL Final   Hematocrit  Date Value Ref Range Status  09/07/2020 38.1 34.0 - 46.6 % Final   MCHC  Date Value Ref Range Status  09/07/2020 32.8 31.5 - 35.7 g/dL Final   [facility]  Date Value Ref Range Status  09/07/2020 27.7 26.6 - 33.0 pg Final   MCV  Date Value Ref Range Status  09/07/2020 85 79 - 97 fL Final   No results found for: "PLTCOUNTKUC", "LABPLAT", "POCPLA" RDW  Date Value Ref Range Status  09/07/2020 13.7 11.7 - 15.4 % Final         Passed - Valid encounter within last 12 months    Recent Outpatient Visits           5 months ago Elevated serum creatinine   [facility] Tally Joe T, FNP   1 year ago Mild  intermittent asthma without complication   [place], Clearnce Sorrel, PA-C   1 year ago Strain of lumbar region, initial encounter   [place], [place], [place]   1 year ago Bacterial conjunctivitis   [place], Clearnce Sorrel, [place]   2 years ago Annual physical exam   [facility] Fenton Malling M, PA-C               Vitamin D, Ergocalciferol, (DRISDOL) 1.25 MG (50000 UNIT) CAPS capsule [Pharmacy Med Name: VITAMIN D (ERGOCALCIFEROL) 1.25 MG] 12 capsule 3    Sig: TAKE 1 CAPSULE BY MOUTH ONCE WEEKLY     Endocrinology:  Vitamins - Vitamin D Supplementation 2 Failed - 12/02/2021  8:55 AM      Failed - Manual Review: Route requests for 50,000 IU strength to the provider      Failed - Ca in normal range and within 360 days    Calcium  Date Value Ref Range Status  09/07/2020  9.3 8.7 - 10.3 mg/dL Final         Failed - Vitamin D in normal range and within 360 days    Vit D, 25-Hydroxy  Date Value Ref Range Status  09/07/2020 41.4 30.0 - 100.0 ng/mL Final    Comment:    Vitamin D deficiency has been defined by the [place] practice guideline as a level of serum 25-OH vitamin D less than 20 ng/mL (1,2). The Endocrine Society went on to further define vitamin D insufficiency as a level between 21 and 29 ng/mL (2). 1. IOM (Institute of Medicine). 2010. Dietary reference    intakes for calcium and D. [place]: The    [employer]. 2. Holick MF, [place], Bischoff-Ferrari HA, et al.    Evaluation, treatment, and prevention of vitamin D    deficiency: an Endocrine Society clinical practice    guideline. JCEM. 2011 Jul; 96(7):1911-30.          Passed - Valid encounter within last 12 months    Recent Outpatient Visits           5 months ago Elevated serum creatinine   [facility] [place] [facility] Tally Joe T, FNP   1 year ago Mild  intermittent asthma without complication   [place], Clearnce Sorrel, PA-C   1 year ago Strain of lumbar region, initial encounter   [place], [place], [place]   1 year ago Bacterial conjunctivitis   [facility] Fenton Malling M, [place]   2 years ago Annual physical exam   [facility] Fenton Malling M, PA-C               potassium chloride (KLOR-CON M) 10 MEQ tablet [employer] Med Name: POTASSIUM CHLORIDE CRYS ER 10 MEQ T] 120 tablet 5    Sig: TAKE 2 [place] DAILY     Endocrinology:  Minerals - Potassium Supplementation Failed - 12/02/2021  8:55 AM      Failed - K in normal range and within 360 days    Potassium  Date Value Ref Range Status  09/07/2020 4.5 3.5 - 5.2 mmol/L Final         Failed - Cr in normal range and within 360 days    Creatinine, Ser  Date Value Ref Range Status  09/07/2020 1.13 (H) 0.57 - 1.00 mg/dL Final         Passed - Valid encounter within last 12 months    Recent Outpatient Visits           5 months ago Elevated serum creatinine   [facility] Tally Joe T, FNP   1 year ago Mild intermittent asthma without complication   [facility], Clearnce Sorrel, PA-C   1 year ago Strain of lumbar region, initial encounter   [place], [place], [place]   1 year ago Bacterial conjunctivitis   [place], Clearnce Sorrel, [place]   2 years ago Annual physical exam   [facility] Fenton Malling M, [place]              Signed Prescriptions Disp Refills   montelukast (SINGULAIR) 10 MG tablet 90 tablet 1    Sig: TAKE 1 TABLET BY MOUTH AT BEDTIME     Pulmonology:  Leukotriene Inhibitors Passed - 12/02/2021  8:55 AM      Passed - Valid encounter within last 12 months  Recent Outpatient Visits           5 months ago Elevated serum creatinine   [facility] Tally Joe T, FNP   1 year ago Mild intermittent asthma without complication   [place], PA-C   1 year ago Strain of lumbar region, initial encounter   [place], [place], [place]   1 year ago Bacterial conjunctivitis   [place], Clearnce Sorrel, [place]   2 years ago Annual physical exam   [facility] [place], [place]

## 2021-12-02 NOTE — Telephone Encounter (Signed)
Requested Prescriptions  Pending Prescriptions Disp Refills  . allopurinol (ZYLOPRIM) 100 MG tablet [Pharmacy Med Name: ALLOPURINOL 100 MG TAB] 90 tablet 1    Sig: TAKE 1 TABLET BY MOUTH ONCE DAILY     Endocrinology:  Gout Agents - allopurinol Failed - 12/02/2021  8:55 AM      Failed - Uric Acid in normal range and within 360 days    Uric Acid  Date Value Ref Range Status  09/16/2016 8.4 (H) 2.5 - 7.1 mg/dL Final    Comment:               Therapeutic target for gout patients: <6.0         Failed - Cr in normal range and within 360 days    Creatinine, Ser  Date Value Ref Range Status  09/07/2020 1.13 (H) 0.57 - 1.00 mg/dL Final         Failed - CBC within normal limits and completed in the last 12 months    WBC  Date Value Ref Range Status  09/07/2020 5.7 3.4 - 10.8 x10E3/uL Final   RBC  Date Value Ref Range Status  09/07/2020 4.51 3.77 - 5.28 x10E6/uL Final   Hemoglobin  Date Value Ref Range Status  09/07/2020 12.5 11.1 - 15.9 g/dL Final   Hematocrit  Date Value Ref Range Status  09/07/2020 38.1 34.0 - 46.6 % Final   MCHC  Date Value Ref Range Status  09/07/2020 32.8 31.5 - 35.7 g/dL Final   [facility]  Date Value Ref Range Status  09/07/2020 27.7 26.6 - 33.0 pg Final   MCV  Date Value Ref Range Status  09/07/2020 85 79 - 97 fL Final   No results found for: "PLTCOUNTKUC", "LABPLAT", "POCPLA" RDW  Date Value Ref Range Status  09/07/2020 13.7 11.7 - 15.4 % Final         Passed - Valid encounter within last 12 months    Recent Outpatient Visits          5 months ago Elevated serum creatinine   [facility] Tally Joe T, FNP   1 year ago Mild intermittent asthma without complication   [place], Clearnce Sorrel, PA-C   1 year ago Strain of lumbar region, initial encounter   [place], [place], [place]   1 year ago Bacterial conjunctivitis   [facility] Fenton Malling M, [place]   2  years ago Annual physical exam   [facility] Fenton Malling M, PA-C             . montelukast (SINGULAIR) 10 MG tablet [Pharmacy Med Name: MONTELUKAST SODIUM 10 MG TAB] 90 tablet 0    Sig: TAKE 1 TABLET BY MOUTH AT BEDTIME     Pulmonology:  Leukotriene Inhibitors Passed - 12/02/2021  8:55 AM      Passed - Valid encounter within last 12 months    Recent Outpatient Visits          5 months ago Elevated serum creatinine   [facility] Tally Joe T, FNP   1 year ago Mild intermittent asthma without complication   [place], Clearnce Sorrel, PA-C   1 year ago Strain of lumbar region, initial encounter   [place], [place], [place]   1 year ago Bacterial conjunctivitis   [place], Clearnce Sorrel, [place]   2 years ago Annual physical exam   [facility] [place], [place]  M, PA-C             . Vitamin D, Ergocalciferol, (DRISDOL) 1.25 MG (50000 UNIT) CAPS capsule [Pharmacy Med Name: VITAMIN D (ERGOCALCIFEROL) 1.25 MG] 12 capsule 3    Sig: TAKE 1 CAPSULE BY MOUTH ONCE WEEKLY     Endocrinology:  Vitamins - Vitamin D Supplementation 2 Failed - 12/02/2021  8:55 AM      Failed - Manual Review: Route requests for 50,000 IU strength to the provider      Failed - Ca in normal range and within 360 days    Calcium  Date Value Ref Range Status  09/07/2020 9.3 8.7 - 10.3 mg/dL Final         Failed - Vitamin D in normal range and within 360 days    Vit D, 25-Hydroxy  Date Value Ref Range Status  09/07/2020 41.4 30.0 - 100.0 ng/mL Final    Comment:    Vitamin D deficiency has been defined by the Institute of Medicine and an Endocrine Society practice guideline as a level of serum 25-OH vitamin D less than 20 ng/mL (1,2). The Endocrine Society went on to further define vitamin D insufficiency as a level between 21 and 29 ng/mL (2). 1. IOM (Institute of Medicine). 2010.  Dietary reference    intakes for calcium and D. [place]: The    [employer]. 2. Holick MF, [place], Bischoff-Ferrari HA, et al.    Evaluation, treatment, and prevention of vitamin D    deficiency: an Endocrine Society clinical practice    guideline. JCEM. 2011 Jul; 96(7):1911-30.          Passed - Valid encounter within last 12 months    Recent Outpatient Visits          5 months ago Elevated serum creatinine   [facility] Tally Joe T, FNP   1 year ago Mild intermittent asthma without complication   [place], Clearnce Sorrel, PA-C   1 year ago Strain of lumbar region, initial encounter   [place], [place], [place]   1 year ago Bacterial conjunctivitis   [facility] Fenton Malling M, [place]   2 years ago Annual physical exam   [facility] Fenton Malling M, [place]             . potassium chloride (KLOR-CON M) 10 MEQ tablet [Pharmacy Med Name: POTASSIUM CHLORIDE CRYS ER 10 MEQ T] 120 tablet 5    Sig: TAKE 2 [place] DAILY     Endocrinology:  Minerals - Potassium Supplementation Failed - 12/02/2021  8:55 AM      Failed - K in normal range and within 360 days    Potassium  Date Value Ref Range Status  09/07/2020 4.5 3.5 - 5.2 mmol/L Final         Failed - Cr in normal range and within 360 days    Creatinine, Ser  Date Value Ref Range Status  09/07/2020 1.13 (H) 0.57 - 1.00 mg/dL Final         Passed - Valid encounter within last 12 months    Recent Outpatient Visits          5 months ago Elevated serum creatinine   [facility] Tally Joe T, FNP   1 year ago Mild intermittent asthma without complication   [place], [place]   1 year ago Strain of lumbar region, initial encounter  [place], [place], [place]   1 year ago Bacterial conjunctivitis    [place], Clearnce Sorrel, [place]   2 years ago Annual physical exam   [facility] Fenton Malling [place], [place]

## 2021-12-02 NOTE — Telephone Encounter (Signed)
[facility] faxed refill request for the following medications:  potassium chloride (KLOR-CON) 10 MEQ tablet   Please advise.

## 2021-12-10 ENCOUNTER — Ambulatory Visit: Payer: Self-pay | Admitting: *Deleted

## 2021-12-10 ENCOUNTER — Other Ambulatory Visit: Payer: Self-pay | Admitting: Family Medicine

## 2021-12-10 DIAGNOSIS — E559 Vitamin D deficiency, unspecified: Secondary | ICD-10-CM

## 2021-12-10 MED ORDER — POTASSIUM CHLORIDE CRYS ER 10 MEQ PO TBCR: 10.0000 meq | EXTENDED_RELEASE_TABLET | Freq: Two times a day (BID) | ORAL | 0 refills | Status: DC

## 2021-12-10 MED ORDER — VITAMIN D (ERGOCALCIFEROL) 1.25 MG (50000 UNIT) PO CAPS: 50000.0000 [IU] | ORAL_CAPSULE | ORAL | 0 refills | Status: DC

## 2021-12-10 NOTE — Telephone Encounter (Signed)
Summary: medication ?   Christine from [facility] needs clear directions on potassium, states take 1 tab by mouth  daily, then 2 tablets by mouth take 2 twice daily. Please call back [phone]       Called [facility] and spoke to [facility], pharmacist regarding potassium order. Please call pharmacy back today to clarify Klor-Con dose .      Reason for Disposition  [1] Caller has medicine question about med NOT prescribed by PCP AND [2] triager unable to answer question (e.g., compatibility with other med, storage)  Answer Assessment - Initial Assessment Questions 1. NAME of MEDICATION: "What medicine are you calling about?"     potassium klor-con 10 mEq per pharmacy at [place] 2. QUESTION: "What is your question?" (e.g., double dose of medicine, side effect)1 tablet twice a day and then take 2 tablets twice a day      Order reads take potassium - 1 tablet (19mq)by mouth 2 times a day and then take 2 tablets twice daily.  3. PRESCRIBING HCP: "Who prescribed it?" Reason: if prescribed by specialist, call should be referred to that group.     PCP 4. SYMPTOMS: "Do you have any symptoms?"     na 5. SEVERITY: If symptoms are present, ask "Are they mild, moderate or severe?"     na 6. PREGNANCY:  "Is there any chance that you are pregnant?" "When was your last menstrual period?"     na  Protocols used: Medication Question Call-A-AH

## 2021-12-10 NOTE — Telephone Encounter (Addendum)
We received another fax from [facility] requesting potassium chloride (KLOR-CON) 10 MEQ tablet  again.  I do not see that it has been sent or a response. Please advise  We also received a fax from [facility] requesting a refill on Vitamin D, Ergocalciferol, (DRISDOL) 1.25 MG (50000 UNIT) CAPS capsule

## 2021-12-13 ENCOUNTER — Other Ambulatory Visit: Payer: Self-pay | Admitting: Family Medicine

## 2021-12-13 MED ORDER — POTASSIUM CHLORIDE CRYS ER 10 MEQ PO TBCR: 20.0000 meq | EXTENDED_RELEASE_TABLET | Freq: Two times a day (BID) | ORAL | 0 refills | Status: DC

## 2021-12-16 ENCOUNTER — Ambulatory Visit (INDEPENDENT_AMBULATORY_CARE_PROVIDER_SITE_OTHER): Payer: PPO | Admitting: Family Medicine

## 2021-12-16 ENCOUNTER — Telehealth: Payer: Self-pay | Admitting: Family Medicine

## 2021-12-16 ENCOUNTER — Encounter: Payer: Self-pay | Admitting: Family Medicine

## 2021-12-16 ENCOUNTER — Other Ambulatory Visit: Payer: Self-pay | Admitting: Family Medicine

## 2021-12-16 VITALS — BP 132/64 | HR 56 | Temp 97.8°F | Resp 16 | Ht 68.0 in | Wt 236.0 lb

## 2021-12-16 DIAGNOSIS — N1831 Chronic kidney disease, stage 3a: Secondary | ICD-10-CM | POA: Diagnosis not present

## 2021-12-16 DIAGNOSIS — R601 Generalized edema: Secondary | ICD-10-CM | POA: Diagnosis not present

## 2021-12-16 DIAGNOSIS — E7800 Pure hypercholesterolemia, unspecified: Secondary | ICD-10-CM | POA: Diagnosis not present

## 2021-12-16 DIAGNOSIS — R7303 Prediabetes: Secondary | ICD-10-CM | POA: Diagnosis not present

## 2021-12-16 DIAGNOSIS — E034 Atrophy of thyroid (acquired): Secondary | ICD-10-CM

## 2021-12-16 DIAGNOSIS — Z20822 Contact with and (suspected) exposure to covid-19: Secondary | ICD-10-CM | POA: Insufficient documentation

## 2021-12-16 DIAGNOSIS — S39012D Strain of muscle, fascia and tendon of lower back, subsequent encounter: Secondary | ICD-10-CM | POA: Diagnosis not present

## 2021-12-16 DIAGNOSIS — R7989 Other specified abnormal findings of blood chemistry: Secondary | ICD-10-CM | POA: Diagnosis not present

## 2021-12-16 DIAGNOSIS — E559 Vitamin D deficiency, unspecified: Secondary | ICD-10-CM

## 2021-12-16 DIAGNOSIS — J4520 Mild intermittent asthma, uncomplicated: Secondary | ICD-10-CM

## 2021-12-16 DIAGNOSIS — G4733 Obstructive sleep apnea (adult) (pediatric): Secondary | ICD-10-CM | POA: Diagnosis not present

## 2021-12-16 DIAGNOSIS — L409 Psoriasis, unspecified: Secondary | ICD-10-CM | POA: Diagnosis not present

## 2021-12-16 DIAGNOSIS — Z0000 Encounter for general adult medical examination without abnormal findings: Secondary | ICD-10-CM

## 2021-12-16 DIAGNOSIS — S39012A Strain of muscle, fascia and tendon of lower back, initial encounter: Secondary | ICD-10-CM | POA: Insufficient documentation

## 2021-12-16 DIAGNOSIS — Z1211 Encounter for screening for malignant neoplasm of colon: Secondary | ICD-10-CM | POA: Diagnosis not present

## 2021-12-16 MED ORDER — ALBUTEROL SULFATE HFA 108 (90 BASE) MCG/ACT IN AERS: 2.0000 | INHALATION_SPRAY | Freq: Four times a day (QID) | RESPIRATORY_TRACT | 1 refills | Status: DC | PRN

## 2021-12-16 MED ORDER — CLOBETASOL PROPIONATE 0.05 % EX SOLN: 1.0000 | CUTANEOUS | 5 refills | Status: DC | PRN

## 2021-12-16 MED ORDER — IBUPROFEN 600 MG PO TABS
ORAL_TABLET | ORAL | 3 refills | Status: DC
Start: 2021-12-16 — End: 2022-08-15

## 2021-12-16 MED ORDER — FUROSEMIDE 40 MG PO TABS: 40.0000 mg | ORAL_TABLET | Freq: Every day | ORAL | 3 refills | Status: DC

## 2021-12-16 NOTE — Assessment & Plan Note (Signed)
UTD on dental; UTD on vision Discussed vaccinations No cervix, >65 Mammogram/DEXA UTD Things to do to keep yourself healthy  - Exercise at least 30-45 minutes a day, 3-4 days a week.  - Eat a low-fat diet with lots of fruits and vegetables, up to 7-9 servings per day.  - Seatbelts can save your life. Wear them always.  - Smoke detectors on every level of your home, check batteries every year.  - Eye Doctor - have an eye exam every 1-2 years  - Safe sex - if you may be exposed to STDs, use a condom.  - Alcohol -  If you drink, do it moderately, less than 2 drinks per day.  - Axis. Choose someone to speak for you if you are not able.  - Depression is common in our stressful world.If you're feeling down or losing interest in things you normally enjoy, please come in for a visit.  - Violence - If anyone is threatening or hurting you, please call immediately.

## 2021-12-16 NOTE — Assessment & Plan Note (Signed)
Chronic, stable Continues to use CPAP nightly Complaints of scalp irritation d/t strap of CPAP; treated with topical steroid PRN

## 2021-12-16 NOTE — Assessment & Plan Note (Signed)
Chronic, stable Use of NSAIDs to assist Denies further work up at this time  Refer to ortho/sports med if necessary

## 2021-12-16 NOTE — Assessment & Plan Note (Signed)
Chronic, stable Reports Rx strength treatment for years; discussed annual lab monitoring given Vit D is fat soluble

## 2021-12-16 NOTE — Progress Notes (Signed)
Established patient visit   Patient: [name]   DOB: 11-Jun-1955   66 y.o. Female  MRN: [id number] Visit Date: 12/16/2021  Today's healthcare provider: Gwyneth Sprout, FNP  Patient presents for new patient visit to establish care.  Introduced to Designer, jewellery role and practice setting.  All questions answered.  Discussed provider/patient relationship and expectations.   I,Tiffany J Bragg,acting as a scribe for Gwyneth Sprout, FNP.,have documented all relevant documentation on the behalf of Gwyneth Sprout, FNP,as directed by  Gwyneth Sprout, FNP while in the presence of Gwyneth Sprout, FNP.   Chief Complaint  Patient presents with   Hypertension   Hypothyroidism   Subjective    HPI  Hypothyroid, follow-up  Lab Results  Component Value Date   TSH 4.770 (H) 09/07/2020   TSH 3.910 09/30/2019   TSH 3.280 12/10/2018   T4TOTAL 6.9 12/10/2018    Wt Readings from Last 3 Encounters:  12/16/21 236 lb (107 kg)  06/23/21 243 lb 2 oz (110.3 kg)  10/06/20 237 lb 6 oz (107.7 kg)    She was last seen for hypothyroid 6 months ago.  Management since that visit includes continue medication. She reports excellent compliance with treatment. She is not having side effects.   Symptoms: No change in energy level No constipation  No diarrhea No heat / cold intolerance  No nervousness No palpitations  No weight changes    -----------------------------------------------------------------------------------------  Hypertension, follow-up  BP Readings from Last 3 Encounters:  12/16/21 132/64  06/23/21 (!) 152/80  10/06/20 124/78   Wt Readings from Last 3 Encounters:  12/16/21 236 lb (107 kg)  06/23/21 243 lb 2 oz (110.3 kg)  10/06/20 237 lb 6 oz (107.7 kg)     She was last seen for hypertension 6 months ago.  BP at that visit was 152/80. Management since that visit includes continue medication.  She reports excellent compliance with treatment. She is not having side  effects.  She is following a Regular diet. She is exercising. She does not smoke.  Use of agents associated with hypertension: thyroid hormones.   Outside blood pressures are around 130/70. Symptoms: No chest pain No chest pressure  No palpitations No syncope  No dyspnea No orthopnea  Yes paroxysmal nocturnal dyspnea Yes lower extremity edema   Pertinent labs Lab Results  Component Value Date   CHOL 191 09/07/2020   HDL 59 09/07/2020   LDLCALC 115 (H) 09/07/2020   TRIG 95 09/07/2020   CHOLHDL 2.9 09/30/2019   Lab Results  Component Value Date   NA 143 09/07/2020   K 4.5 09/07/2020   CREATININE 1.13 (H) 09/07/2020   EGFR 53 (L) 09/07/2020   GLUCOSE 111 (H) 09/07/2020   TSH 4.770 (H) 09/07/2020     The 10-year ASCVD risk score (Arnett DK, et al., 2019) is: 19.2%  ---------------------------------------------------------------------------------------------------   Medications: Outpatient Medications Prior to Visit  Medication Sig   albuterol (VENTOLIN HFA) 108 (90 Base) MCG/ACT inhaler Inhale 2 puffs into the lungs every 6 (six) hours as needed.   allopurinol (ZYLOPRIM) 100 MG tablet TAKE 1 TABLET BY MOUTH ONCE DAILY   fluticasone (FLONASE) 50 MCG/ACT nasal spray USE 2 SPRAYS INTO EACH NOSTRIL ONCE DAILY AS DIRECTED.   levothyroxine (SYNTHROID) 50 MCG tablet Take 1 tablet (50 mcg total) by mouth every morning. Patient is due for annual thyroid labs. Please complete in prior to completion of this Rx.   losartan (COZAAR) 50 MG  tablet Take 1 tablet (50 mg total) by mouth daily.   montelukast (SINGULAIR) 10 MG tablet TAKE 1 TABLET BY MOUTH AT BEDTIME   NON FORMULARY CPAP nightly.   potassium chloride (KLOR-CON M) 10 MEQ tablet Take 2 tablets (20 mEq total) by mouth 2 (two) times daily.   Vitamin D, Ergocalciferol, (DRISDOL) 1.25 MG (50000 UNIT) CAPS capsule Take 1 capsule (50,000 Units total) by mouth once a week.   [DISCONTINUED] clobetasol (TEMOVATE) 0.05 % external  solution Apply 1 application topically as needed. Due to CPAP use   [DISCONTINUED] cyclobenzaprine (FLEXERIL) 5 MG tablet TAKE 1 TABLET BY MOUTH 3 TIMES DAILY AS MNEEDED FOR MUSCLE SPASMS   [DISCONTINUED] furosemide (LASIX) 40 MG tablet TAKE 1 TABLET BY MOUTH TWICE DAILY   [DISCONTINUED] hydrOXYzine (ATARAX/VISTARIL) 25 MG tablet Take 1 tablet (25 mg total) by mouth 3 (three) times daily as needed.   [DISCONTINUED] ibuprofen (IBU) 600 MG tablet TAKE 1 TABLET BY MOUTH EVERY 8 HOURS AS NEEDED.   [DISCONTINUED] olopatadine (PATANOL) 0.1 % ophthalmic solution Place 1 drop into both eyes daily. As needed   [DISCONTINUED] Omega-3 Fatty Acids (FISH OIL) 1200 MG CAPS Take 2 capsules twice a day.   [DISCONTINUED] clindamycin (CLEOCIN) 300 MG capsule Take 300 mg by mouth 2 (two) times daily.   No facility-administered medications prior to visit.    Review of Systems  Last CBC Lab Results  Component Value Date   WBC 5.7 09/07/2020   HGB 12.5 09/07/2020   HCT 38.1 09/07/2020   MCV 85 09/07/2020   MCH 27.7 09/07/2020   RDW 13.7 09/07/2020   PLT 211 72/53/6644   Last metabolic panel Lab Results  Component Value Date   GLUCOSE 111 (H) 09/07/2020   NA 143 09/07/2020   K 4.5 09/07/2020   CL 104 09/07/2020   CO2 25 09/07/2020   BUN 18 09/07/2020   CREATININE 1.13 (H) 09/07/2020   EGFR 53 (L) 09/07/2020   CALCIUM 9.3 09/07/2020   PROT 7.0 09/07/2020   ALBUMIN 4.3 09/07/2020   LABGLOB 2.7 09/07/2020   AGRATIO 1.6 09/07/2020   BILITOT 0.5 09/07/2020   ALKPHOS 98 09/07/2020   AST 18 09/07/2020   ALT 21 09/07/2020   Last lipids Lab Results  Component Value Date   CHOL 191 09/07/2020   HDL 59 09/07/2020   LDLCALC 115 (H) 09/07/2020   TRIG 95 09/07/2020   CHOLHDL 2.9 09/30/2019   Last hemoglobin A1c Lab Results  Component Value Date   HGBA1C 6.4 (H) 09/07/2020   Last thyroid functions Lab Results  Component Value Date   TSH 4.770 (H) 09/07/2020   T4TOTAL 6.9 12/10/2018   Last  vitamin D Lab Results  Component Value Date   VD25OH 41.4 09/07/2020       Objective    BP 132/64 (BP Location: Right Arm, Patient Position: Sitting, Cuff Size: Normal)   Pulse (!) 56   Temp 97.8 F (36.6 C) (Oral)   Resp 16   Ht '5\' 8"'  (1.727 m)   Wt 236 lb (107 kg)   SpO2 98%   BMI 35.88 kg/m  BP Readings from Last 3 Encounters:  12/16/21 132/64  06/23/21 (!) 152/80  10/06/20 124/78   Wt Readings from Last 3 Encounters:  12/16/21 236 lb (107 kg)  06/23/21 243 lb 2 oz (110.3 kg)  10/06/20 237 lb 6 oz (107.7 kg)   SpO2 Readings from Last 3 Encounters:  12/16/21 98%  06/23/21 98%  10/06/20 98%  Physical Exam Vitals and nursing note reviewed.  Constitutional:      General: She is not in acute distress.    Appearance: Normal appearance. She is obese. She is not ill-appearing, toxic-appearing or diaphoretic.  HENT:     Head: Normocephalic and atraumatic.     Right Ear: External ear normal.     Left Ear: External ear normal.     Nose: Nose normal.     Mouth/Throat:     Mouth: Mucous membranes are moist.  Eyes:     Extraocular Movements: Extraocular movements intact.     Conjunctiva/sclera: Conjunctivae normal.     Pupils: Pupils are equal, round, and reactive to light.  Neck:     Thyroid: No thyroid mass, thyromegaly or thyroid tenderness.  Cardiovascular:     Rate and Rhythm: Normal rate and regular rhythm.     Pulses: Normal pulses.     Heart sounds: Normal heart sounds. No murmur heard.    No friction rub. No gallop.  Pulmonary:     Effort: Pulmonary effort is normal. No respiratory distress.     Breath sounds: Normal breath sounds. No stridor. No wheezing, rhonchi or rales.  Chest:     Chest wall: No tenderness.     Comments: Breasts: risk and benefit of breast self-exam was discussed, not examined, patient declines to have breast exam  Abdominal:     General: Bowel sounds are normal.     Palpations: Abdomen is soft.  Genitourinary:    Comments:  Declines exam; no complaints Musculoskeletal:        General: No swelling, tenderness, deformity or signs of injury. Normal range of motion.     Cervical back: Normal range of motion and neck supple.     Right lower leg: No edema.     Left lower leg: No edema.  Lymphadenopathy:     Cervical: No cervical adenopathy.  Skin:    General: Skin is warm and dry.     Capillary Refill: Capillary refill takes less than 2 seconds.     Coloration: Skin is not jaundiced or pale.     Findings: No bruising, erythema, lesion or rash.  Neurological:     General: No focal deficit present.     Mental Status: She is alert and oriented to person, place, and time. Mental status is at baseline.     Cranial Nerves: No cranial nerve deficit.     Sensory: No sensory deficit.     Motor: No weakness.     Coordination: Coordination normal.  Psychiatric:        Mood and Affect: Mood normal.        Behavior: Behavior normal.        Thought Content: Thought content normal.        Judgment: Judgment normal.       No results found for any visits on 12/16/21.  Assessment & Plan     Problem List Items Addressed This Visit       Respiratory   Obstructive sleep apnea    Chronic, stable Continues to use CPAP nightly Complaints of scalp irritation d/t strap of CPAP; treated with topical steroid PRN        Endocrine   Hypothyroidism due to acquired atrophy of thyroid   Relevant Orders   TSH + free T4     Musculoskeletal and Integument   Psoriasis    Chronic, intermittent, stable Related to use of CPAP/strap along neck/hair line Declines referral to derm  at this time       Relevant Medications   clobetasol (TEMOVATE) 0.05 % external solution   Strain of lumbar region    Chronic, stable Use of NSAIDs to assist Denies further work up at this time  Refer to ortho/sports med if necessary       Relevant Medications   ibuprofen (IBU) 600 MG tablet     Genitourinary   Chronic kidney disease  (CKD), stage III (moderate) (HCC)    Chronic, stable Has reduced Lasix from BID to QD d/t assist with eGFR/Creatinine Followed by cardiology Repeat CMP      Relevant Orders   Comprehensive metabolic panel   CBC with Differential/Platelet     Other   Annual physical exam - Primary    UTD on dental; UTD on vision Discussed vaccinations No cervix, >65 Mammogram/DEXA UTD Things to do to keep yourself healthy  - Exercise at least 30-45 minutes a day, 3-4 days a week.  - Eat a low-fat diet with lots of fruits and vegetables, up to 7-9 servings per day.  - Seatbelts can save your life. Wear them always.  - Smoke detectors on every level of your home, check batteries every year.  - Eye Doctor - have an eye exam every 1-2 years  - Safe sex - if you may be exposed to STDs, use a condom.  - Alcohol -  If you drink, do it moderately, less than 2 drinks per day.  - [place]. Choose someone to speak for you if you are not able.  - Depression is common in our stressful world.If you're feeling down or losing interest in things you normally enjoy, please come in for a visit.  - Violence - If anyone is threatening or hurting you, please call immediately.        Colon cancer screening    Due for 3 year colon cancer screening; denies changes in stool color, consistency, character       Relevant Orders   Cologuard   Edema    Chronic, stable 2+ RLE 3+ LLE, history of injury to LLE Associated with use of daily Lasix Followed by cardiology; also wears compression tights in place of TEDS to assist       Relevant Medications   furosemide (LASIX) 40 MG tablet   Elevated LDL cholesterol level    Chronic, previous elevated Repeat FLP I recommend diet low in saturated fat and regular exercise - 30 min at least 5 times per week       Relevant Orders   Lipid panel   Elevated serum creatinine    Chronic, stable Associated with CKD Stage 3 Repeat CMP Ensure adequate  hydration with water intake       Relevant Orders   Comprehensive metabolic panel   Exposure to COVID-19 virus    Denies fevering; history of vaccination Request for PCR for antibody levels Declines referral to ID at this time       Relevant Orders   Novel Coronavirus, NAA ([facility])   Prediabetes    Chronic, stable with diet/exercise Repeat A1c Continue to recommend balanced, lower carb meals. Smaller meal size, adding snacks. Choosing water as drink of choice and increasing purposeful exercise. Recommend use of Metformin 750 mg BID if agreeable to assist       Relevant Orders   Hemoglobin A1c   Vitamin D deficiency    Chronic, stable Reports Rx strength treatment for years; discussed annual lab monitoring given  Vit D is fat soluble       Relevant Orders   Vitamin D (25 hydroxy)     Return in about 5 months (around 05/18/2022) for annual examination- AWV.      Vonna Kotyk, FNP, have reviewed all documentation for this visit. The documentation on 12/16/21 for the exam, diagnosis, procedures, and orders are all accurate and complete.    Gwyneth Sprout, [place] [phone] (phone) [phone] (fax)  [place]

## 2021-12-16 NOTE — Telephone Encounter (Signed)
[facility] faxed refill request for the following medications:  albuterol (VENTOLIN HFA) 108 (90 Base) MCG/ACT inhaler   Please advise.

## 2021-12-16 NOTE — Assessment & Plan Note (Signed)
Chronic, intermittent, stable Related to use of CPAP/strap along neck/hair line Declines referral to derm at this time

## 2021-12-16 NOTE — Assessment & Plan Note (Signed)
Due for 3 year colon cancer screening; denies changes in stool color, consistency, character

## 2021-12-16 NOTE — Assessment & Plan Note (Signed)
Chronic, stable Associated with CKD Stage 3 Repeat CMP Ensure adequate hydration with water intake

## 2021-12-16 NOTE — Assessment & Plan Note (Signed)
Chronic, stable with diet/exercise Repeat A1c Continue to recommend balanced, lower carb meals. Smaller meal size, adding snacks. Choosing water as drink of choice and increasing purposeful exercise. Recommend use of Metformin 750 mg BID if agreeable to assist

## 2021-12-16 NOTE — Assessment & Plan Note (Signed)
Chronic, previous elevated Repeat FLP I recommend diet low in saturated fat and regular exercise - 30 min at least 5 times per week

## 2021-12-16 NOTE — Assessment & Plan Note (Signed)
Chronic, stable Has reduced Lasix from BID to QD d/t assist with eGFR/Creatinine Followed by cardiology Repeat CMP

## 2021-12-16 NOTE — Assessment & Plan Note (Signed)
Chronic, stable 2+ RLE 3+ LLE, history of injury to LLE Associated with use of daily Lasix Followed by cardiology; also wears compression tights in place of TEDS to assist

## 2021-12-16 NOTE — Patient Instructions (Signed)
The CDC recommends two doses of Shingrix (the new shingles vaccine) separated by 2 to 6 months for adults age 68 years and older. I recommend checking with your insurance plan regarding coverage for this vaccine.    

## 2021-12-16 NOTE — Assessment & Plan Note (Signed)
Denies fevering; history of vaccination Request for PCR for antibody levels Declines referral to ID at this time

## 2021-12-17 ENCOUNTER — Other Ambulatory Visit: Payer: Self-pay | Admitting: Family Medicine

## 2021-12-17 ENCOUNTER — Encounter: Payer: Self-pay | Admitting: Family Medicine

## 2021-12-17 DIAGNOSIS — N1831 Chronic kidney disease, stage 3a: Secondary | ICD-10-CM

## 2021-12-17 DIAGNOSIS — E559 Vitamin D deficiency, unspecified: Secondary | ICD-10-CM

## 2021-12-17 MED ORDER — LEVOTHYROXINE SODIUM 50 MCG PO TABS: 50.0000 ug | ORAL_TABLET | Freq: Every morning | ORAL | 3 refills | Status: DC

## 2021-12-17 MED ORDER — DAPAGLIFLOZIN PROPANEDIOL 10 MG PO TABS: 10.0000 mg | ORAL_TABLET | Freq: Every day | ORAL | 3 refills | Status: DC

## 2021-12-17 MED ORDER — VITAMIN D (ERGOCALCIFEROL) 1.25 MG (50000 UNIT) PO CAPS
50000.0000 [IU] | ORAL_CAPSULE | ORAL | 0 refills | Status: DC
Start: 2021-12-17 — End: 2022-07-18

## 2021-12-17 NOTE — Progress Notes (Signed)
Creatinine and eGFR remains low, consistent to where it was 1 year ago. Recommend follow up with kidney specialist, nephrologist. Can add faxiga to assist.  Cholesterol is improved; however, LDL is above goal >100. I recommend diet low in saturated fat and regular exercise - 30 min at least 5 times per week  A1c is also improved; however, remains pre-diabetic. Continue to recommend balanced, lower carb meals. Smaller meal size, adding snacks. Choosing water as drink of choice and increasing purposeful exercise.  Vit D is decreased; OK to continue weekly supplement for 6 months.  TSH is stabilized. OK to continue current dose.  Normal cell count.  COVID AB is pending.  Please let us know if you have any questions.  Thank you, Gwyneth Sprout, [address] [place], [place] [address] [phone] (phone) [phone] (fax) [place]

## 2021-12-21 LAB — LIPID PANEL
Chol/HDL Ratio: 2.9 ratio (ref 0.0–4.4)
Cholesterol, Total: 180 mg/dL (ref 100–199)
HDL: 63 mg/dL (ref >39)
LDL Chol Calc (NIH): 104 mg/dL — ABNORMAL HIGH (ref 0–99)
Triglycerides: 72 mg/dL (ref 0–149)
VLDL Cholesterol Cal: 13 mg/dL (ref 5–40)

## 2021-12-21 LAB — COMPREHENSIVE METABOLIC PANEL
ALT: 15 IU/L (ref 0–32)
AST: 19 IU/L (ref 0–40)
Albumin/Globulin Ratio: 2.1 (ref 1.2–2.2)
Albumin: 4.5 g/dL (ref 3.9–4.9)
Alkaline Phosphatase: 84 IU/L (ref 44–121)
BUN/Creatinine Ratio: 17 (ref 12–28)
BUN: 20 mg/dL (ref 8–27)
Bilirubin Total: 0.6 mg/dL (ref 0.0–1.2)
CO2: 24 mmol/L (ref 20–29)
Calcium: 9.3 mg/dL (ref 8.7–10.3)
Chloride: 101 mmol/L (ref 96–106)
Creatinine, Ser: 1.17 mg/dL — ABNORMAL HIGH (ref 0.57–1.00)
Globulin, Total: 2.1 g/dL (ref 1.5–4.5)
Glucose: 97 mg/dL (ref 70–99)
Potassium: 4.2 mmol/L (ref 3.5–5.2)
Sodium: 142 mmol/L (ref 134–144)
Total Protein: 6.6 g/dL (ref 6.0–8.5)
eGFR: 51 mL/min/{1.73_m2} — ABNORMAL LOW (ref >59)

## 2021-12-21 LAB — TSH+FREE T4
Free T4: 1.5 ng/dL (ref 0.82–1.77)
TSH: 1.47 u[IU]/mL (ref 0.450–4.500)

## 2021-12-21 LAB — CBC WITH DIFFERENTIAL/PLATELET
Basophils Absolute: 0 10*3/uL (ref 0.0–0.2)
Basos: 1 %
EOS (ABSOLUTE): 0.2 10*3/uL (ref 0.0–0.4)
Eos: 3 %
Hematocrit: 39.6 % (ref 34.0–46.6)
Hemoglobin: 12.8 g/dL (ref 11.1–15.9)
Immature Grans (Abs): 0 10*3/uL (ref 0.0–0.1)
Immature Granulocytes: 0 %
Lymphocytes Absolute: 1 10*3/uL (ref 0.7–3.1)
Lymphs: 21 %
MCH: 28.6 pg (ref 26.6–33.0)
MCHC: 32.3 g/dL (ref 31.5–35.7)
MCV: 88 fL (ref 79–97)
Monocytes Absolute: 0.4 10*3/uL (ref 0.1–0.9)
Monocytes: 8 %
Neutrophils Absolute: 3.3 10*3/uL (ref 1.4–7.0)
Neutrophils: 67 %
Platelets: 178 10*3/uL (ref 150–450)
RBC: 4.48 x10E6/uL (ref 3.77–5.28)
RDW: 13.2 % (ref 11.7–15.4)
WBC: 4.9 10*3/uL (ref 3.4–10.8)

## 2021-12-21 LAB — HEMOGLOBIN A1C
Est. average glucose Bld gHb Est-mCnc: 128 mg/dL
Hgb A1c MFr Bld: 6.1 % — ABNORMAL HIGH (ref 4.8–5.6)

## 2021-12-21 LAB — SARS-COV-2 ANTIBODIES: SARS-CoV-2 Antibodies: NEGATIVE

## 2021-12-21 LAB — VITAMIN D 25 HYDROXY (VIT D DEFICIENCY, FRACTURES): Vit D, 25-Hydroxy: 32.5 ng/mL (ref 30.0–100.0)

## 2021-12-21 NOTE — Progress Notes (Signed)
Antibody count is now negative. Likely transient elevation prior. No further lab work needed. Gwyneth Sprout, [place] [address] [place], [place] [address] [phone] (phone) [phone] (fax) [place]

## 2021-12-22 ENCOUNTER — Ambulatory Visit: Payer: Self-pay | Admitting: *Deleted

## 2021-12-22 NOTE — Telephone Encounter (Signed)
  Answer Assessment - Initial Assessment Questions 1. REASON FOR CALL or QUESTION: "What is your reason for calling today?" or "How can I best help you?" or "What question do you have that I can help answer?"     States she did not know anything about starting Wilder Glade and now she has paid $90 for it.  Protocols used: Information Only Call - No Triage-A-AH  Pt very upset after returning from pharmacy with $90 bottle of Farxiga. She states she was not told that she was being put on new medication. The note on lab results states that pt is going to see her nephrologist. Will hold off on starting Farxiga. Pt states that must mean the provider is saying will hold off because she states she was not told of new medication. She would like a call back from Tally Joe, [place]. She is knowledgeable about her labs and is questioning if GFR is improved why this would be needed.

## 2021-12-23 NOTE — Telephone Encounter (Signed)
Patient advised.

## 2022-01-12 ENCOUNTER — Other Ambulatory Visit: Payer: Self-pay | Admitting: Family Medicine

## 2022-01-12 NOTE — Telephone Encounter (Signed)
Requested Prescriptions  Pending Prescriptions Disp Refills  . potassium chloride (KLOR-CON M) 10 MEQ tablet [Pharmacy Med Name: POTASSIUM CHLORIDE CRYS ER 10 MEQ T] 120 tablet 0    Sig: TAKE 2 TABLETS BY MOUTH TWICE DAILY     Endocrinology:  Minerals - Potassium Supplementation Failed - 01/12/2022  1:39 PM      Failed - Cr in normal range and within 360 days    Creatinine, Ser  Date Value Ref Range Status  12/16/2021 1.17 (H) 0.57 - 1.00 mg/dL Final         Passed - K in normal range and within 360 days    Potassium  Date Value Ref Range Status  12/16/2021 4.2 3.5 - 5.2 mmol/L Final         Passed - Valid encounter within last 12 months    Recent Outpatient Visits          3 weeks ago Annual physical exam   [facility] Tally Joe T, FNP   7 months ago Elevated serum creatinine   [facility] Tally Joe T, FNP   1 year ago Mild intermittent asthma without complication   [place], PA-C   1 year ago Strain of lumbar region, initial encounter   [place], [place], [place]   1 year ago Bacterial conjunctivitis   [facility] [place], [place], [place]

## 2022-01-18 DIAGNOSIS — Z1211 Encounter for screening for malignant neoplasm of colon: Secondary | ICD-10-CM | POA: Diagnosis not present

## 2022-01-19 DIAGNOSIS — R7303 Prediabetes: Secondary | ICD-10-CM | POA: Diagnosis not present

## 2022-01-19 DIAGNOSIS — R600 Localized edema: Secondary | ICD-10-CM | POA: Diagnosis not present

## 2022-01-19 DIAGNOSIS — I10 Essential (primary) hypertension: Secondary | ICD-10-CM | POA: Diagnosis not present

## 2022-01-19 DIAGNOSIS — N1831 Chronic kidney disease, stage 3a: Secondary | ICD-10-CM | POA: Diagnosis not present

## 2022-01-19 DIAGNOSIS — I129 Hypertensive chronic kidney disease with stage 1 through stage 4 chronic kidney disease, or unspecified chronic kidney disease: Secondary | ICD-10-CM | POA: Diagnosis not present

## 2022-01-24 LAB — COLOGUARD: COLOGUARD: NEGATIVE

## 2022-01-25 NOTE — Progress Notes (Signed)
Negative cologuard; repeat in 3 years.  [name], [address] [place], [place] [address] [phone] (phone) [phone] (fax) [place]

## 2022-02-28 ENCOUNTER — Other Ambulatory Visit: Payer: Self-pay | Admitting: Family Medicine

## 2022-03-17 ENCOUNTER — Other Ambulatory Visit: Payer: Self-pay | Admitting: Family Medicine

## 2022-03-17 DIAGNOSIS — J301 Allergic rhinitis due to pollen: Secondary | ICD-10-CM

## 2022-03-17 DIAGNOSIS — J4520 Mild intermittent asthma, uncomplicated: Secondary | ICD-10-CM

## 2022-04-13 ENCOUNTER — Ambulatory Visit: Payer: PPO | Admitting: Family Medicine

## 2022-04-26 ENCOUNTER — Other Ambulatory Visit: Payer: Self-pay | Admitting: Family Medicine

## 2022-04-26 NOTE — Telephone Encounter (Signed)
Requested Prescriptions  Pending Prescriptions Disp Refills   potassium chloride (KLOR-CON M) 10 MEQ tablet [Pharmacy Med Name: POTASSIUM CHLORIDE CRYS ER 10 MEQ T] 120 tablet 0    Sig: TAKE 2 TABLETS BY MOUTH TWICE DAILY     Endocrinology:  Minerals - Potassium Supplementation Failed - 04/26/2022 11:27 AM      Failed - Cr in normal range and within 360 days    Creatinine, Ser  Date Value Ref Range Status  12/16/2021 1.17 (H) 0.57 - 1.00 mg/dL Final         Passed - K in normal range and within 360 days    Potassium  Date Value Ref Range Status  12/16/2021 4.2 3.5 - 5.2 mmol/L Final         Passed - Valid encounter within last 12 months    Recent Outpatient Visits           4 months ago Annual physical exam   [facility] Tally Joe T, FNP   10 months ago Elevated serum creatinine   [facility] Tally Joe T, FNP   1 year ago Mild intermittent asthma without complication   [place], PA-C   1 year ago Strain of lumbar region, initial encounter   [facility] [place], [place], [place]   2 years ago Bacterial conjunctivitis   [facility] [place], [place], [place]

## 2022-05-16 ENCOUNTER — Ambulatory Visit (INDEPENDENT_AMBULATORY_CARE_PROVIDER_SITE_OTHER): Payer: PPO

## 2022-05-16 VITALS — Ht 68.0 in | Wt 236.0 lb

## 2022-05-16 DIAGNOSIS — Z0000 Encounter for general adult medical examination without abnormal findings: Secondary | ICD-10-CM

## 2022-05-16 NOTE — Patient Instructions (Signed)
[name] , Thank you for taking time to come for your Medicare Wellness Visit. I appreciate your ongoing commitment to your health goals. Please review the following plan we discussed and let me know if I can assist you in the future.   Screening recommendations/referrals: Colonoscopy: Cologuard 01/24/22 Mammogram: 08/23/21 Bone Density: 08/23/21 Recommended yearly ophthalmology/optometry visit for glaucoma screening and checkup Recommended yearly dental visit for hygiene and checkup  Vaccinations: Influenza vaccine: n/d Pneumococcal vaccine: 06/24/15 Tdap vaccine: 10/08/18 Shingles vaccine: Zostavax 08/03/12   Covid-19:07/29/19, 08/20/19  Advanced directives: no  Conditions/risks identified: none  Next appointment: Follow up in one year for your annual wellness visit 05/18/23 @ 8:45 am by phone   Preventive Care 65 Years and Older, Female Preventive care refers to lifestyle choices and visits with your health care provider that can promote health and wellness. What does preventive care include? A yearly physical exam. This is also called an annual well check. Dental exams once or twice a year. Routine eye exams. Ask your health care provider how often you should have your eyes checked. Personal lifestyle choices, including: Daily care of your teeth and gums. Regular physical activity. Eating a healthy diet. Avoiding tobacco and drug use. Limiting alcohol use. Practicing safe sex. Taking low-dose aspirin every day. Taking vitamin and mineral supplements as recommended by your health care provider. What happens during an annual well check? The services and screenings done by your health care provider during your annual well check will depend on your age, overall health, lifestyle risk factors, and family history of disease. Counseling  Your health care provider may ask you questions about your: Alcohol use. Tobacco use. Drug use. Emotional well-being. Home and relationship  well-being. Sexual activity. Eating habits. History of falls. Memory and ability to understand (cognition). Work and work Statistician. Reproductive health. Screening  You may have the following tests or measurements: Height, weight, and BMI. Blood pressure. Lipid and cholesterol levels. These may be checked every 5 years, or more frequently if you are over 55 years old. Skin check. Lung cancer screening. You may have this screening every year starting at age 19 if you have a 30-pack-year history of smoking and currently smoke or have quit within the past 15 years. Fecal occult blood test (FOBT) of the stool. You may have this test every year starting at age 9. Flexible sigmoidoscopy or colonoscopy. You may have a sigmoidoscopy every 5 years or a colonoscopy every 10 years starting at age 46. Hepatitis C blood test. Hepatitis B blood test. Sexually transmitted disease (STD) testing. Diabetes screening. This is done by checking your blood sugar (glucose) after you have not eaten for a while (fasting). You may have this done every 1-3 years. Bone density scan. This is done to screen for osteoporosis. You may have this done starting at age 98. Mammogram. This may be done every 1-2 years. Talk to your health care provider about how often you should have regular mammograms. Talk with your health care provider about your test results, treatment options, and if necessary, the need for more tests. Vaccines  Your health care provider may recommend certain vaccines, such as: Influenza vaccine. This is recommended every year. Tetanus, diphtheria, and acellular pertussis (Tdap, Td) vaccine. You may need a Td booster every 10 years. Zoster vaccine. You may need this after age 3. Pneumococcal 13-valent conjugate (PCV13) vaccine. One dose is recommended after age 90. Pneumococcal polysaccharide (PPSV23) vaccine. One dose is recommended after age 49. Talk to your health  care provider about which  screenings and vaccines you need and how often you need them. This information is not intended to replace advice given to you by your health care provider. Make sure you discuss any questions you have with your health care provider. Document Released: 06/19/2015 Document Revised: 02/10/2016 Document Reviewed: 03/24/2015 Elsevier Interactive Patient Education  2017 [place] Prevention in the Home Falls can cause injuries. They can happen to people of all ages. There are many things you can do to make your home safe and to help prevent falls. What can I do on the outside of my home? Regularly fix the edges of walkways and driveways and fix any cracks. Remove anything that might make you trip as you walk through a door, such as a raised step or threshold. Trim any bushes or trees on the path to your home. Use bright outdoor lighting. Clear any walking paths of anything that might make someone trip, such as rocks or tools. Regularly check to see if handrails are loose or broken. Make sure that both sides of any steps have handrails. Any raised decks and porches should have guardrails on the edges. Have any leaves, snow, or ice cleared regularly. Use sand or salt on walking paths during winter. Clean up any spills in your garage right away. This includes oil or grease spills. What can I do in the bathroom? Use night lights. Install grab bars by the toilet and in the tub and shower. Do not use towel bars as grab bars. Use non-skid mats or decals in the tub or shower. If you need to sit down in the shower, use a plastic, non-slip stool. Keep the floor dry. Clean up any water that spills on the floor as soon as it happens. Remove soap buildup in the tub or shower regularly. Attach bath mats securely with double-sided non-slip rug tape. Do not have throw rugs and other things on the floor that can make you trip. What can I do in the bedroom? Use night lights. Make sure that you have a  light by your bed that is easy to reach. Do not use any sheets or blankets that are too big for your bed. They should not hang down onto the floor. Have a firm chair that has side arms. You can use this for support while you get dressed. Do not have throw rugs and other things on the floor that can make you trip. What can I do in the kitchen? Clean up any spills right away. Avoid walking on wet floors. Keep items that you use a lot in easy-to-reach places. If you need to reach something above you, use a strong step stool that has a grab bar. Keep electrical cords out of the way. Do not use floor polish or wax that makes floors slippery. If you must use wax, use non-skid floor wax. Do not have throw rugs and other things on the floor that can make you trip. What can I do with my stairs? Do not leave any items on the stairs. Make sure that there are handrails on both sides of the stairs and use them. Fix handrails that are broken or loose. Make sure that handrails are as long as the stairways. Check any carpeting to make sure that it is firmly attached to the stairs. Fix any carpet that is loose or worn. Avoid having throw rugs at the top or bottom of the stairs. If you do have throw rugs, attach them to  the floor with carpet tape. Make sure that you have a light switch at the top of the stairs and the bottom of the stairs. If you do not have them, ask someone to add them for you. What else can I do to help prevent falls? Wear shoes that: Do not have high heels. Have rubber bottoms. Are comfortable and fit you well. Are closed at the toe. Do not wear sandals. If you use a stepladder: Make sure that it is fully opened. Do not climb a closed stepladder. Make sure that both sides of the stepladder are locked into place. Ask someone to hold it for you, if possible. Clearly mark and make sure that you can see: Any grab bars or handrails. First and last steps. Where the edge of each step  is. Use tools that help you move around (mobility aids) if they are needed. These include: Canes. Walkers. Scooters. Crutches. Turn on the lights when you go into a dark area. Replace any light bulbs as soon as they burn out. Set up your furniture so you have a clear path. Avoid moving your furniture around. If any of your floors are uneven, fix them. If there are any pets around you, be aware of where they are. Review your medicines with your doctor. Some medicines can make you feel dizzy. This can increase your chance of falling. Ask your doctor what other things that you can do to help prevent falls. This information is not intended to replace advice given to you by your health care provider. Make sure you discuss any questions you have with your health care provider. Document Released: 03/19/2009 Document Revised: 10/29/2015 Document Reviewed: 06/27/2014 Elsevier Interactive Patient Education  2017 [employer].

## 2022-05-16 NOTE — Progress Notes (Signed)
Virtual Visit via Telephone Note  I connected with  [name] on 05/16/22 at  9:00 AM EST by telephone and verified that I am speaking with the correct person using two identifiers.  Location: Patient: home Provider: [facility] Persons participating in the virtual visit: [place]   I discussed the limitations, risks, security and privacy concerns of performing an evaluation and management service by telephone and the availability of in person appointments. The patient expressed understanding and agreed to proceed.  Interactive audio and video telecommunications were attempted between this nurse and patient, however failed, due to patient having technical difficulties OR patient did not have access to video capability.  We continued and completed visit with audio only.  Some vital signs may be absent or patient reported.   Dionisio David, LPN  Subjective:   [name] is a 66 y.o. female who presents for Medicare Annual (Subsequent) preventive examination.  Review of Systems     Cardiac Risk Factors include: advanced age (>58mn, >>7women)     Objective:    There were no vitals filed for this visit. There is no height or weight on file to calculate BMI.     05/16/2022    9:05 AM 05/12/2021    9:48 AM 09/16/2019    9:49 AM  Advanced Directives  Does Patient Have a Medical Advance Directive? No No No  Would patient like information on creating a medical advance directive? No - Patient declined No - Patient declined No - Patient declined    Current Medications (verified) Outpatient Encounter Medications as of 05/16/2022  Medication Sig   albuterol (VENTOLIN HFA) 108 (90 Base) MCG/ACT inhaler Inhale 2 puffs into the lungs every 6 (six) hours as needed.   allopurinol (ZYLOPRIM) 100 MG tablet TAKE 1 TABLET BY MOUTH ONCE DAILY   clobetasol (TEMOVATE) 0.05 % external solution Apply 1 Application topically as needed.   dapagliflozin propanediol (FARXIGA) 10  MG TABS tablet Take 1 tablet (10 mg total) by mouth daily before breakfast.   fluticasone (FLONASE) 50 MCG/ACT nasal spray USE 2 SPRAYS INTO EACH NOSTRIL ONCE DAILY AS DIRECTED.   furosemide (LASIX) 40 MG tablet Take 1 tablet (40 mg total) by mouth daily.   ibuprofen (IBU) 600 MG tablet TAKE 1 TABLET BY MOUTH EVERY 8 HOURS AS NEEDED.   levothyroxine (SYNTHROID) 50 MCG tablet Take 1 tablet (50 mcg total) by mouth every morning.   losartan (COZAAR) 50 MG tablet Take 1 tablet (50 mg total) by mouth daily.   montelukast (SINGULAIR) 10 MG tablet TAKE 1 TABLET BY MOUTH AT BEDTIME   NON FORMULARY CPAP nightly.   potassium chloride (KLOR-CON M) 10 MEQ tablet TAKE 2 TABLETS BY MOUTH TWICE DAILY   Vitamin D, Ergocalciferol, (DRISDOL) 1.25 MG (50000 UNIT) CAPS capsule Take 1 capsule (50,000 Units total) by mouth once a week. Repeat labs following 6 months of weekly use.   No facility-administered encounter medications on file as of 05/16/2022.    Allergies (verified) Penicillins   History: Past Medical History:  Diagnosis Date   Broken arm    Cervical cancer ([facility]    Chronic kidney disease, stage III (moderate) ([facility]    Foot fracture 2013   Hiatal hernia    Hypopotassemia    OSA on CPAP    Other abnormal glucose    Pure hypercholesterolemia    Reflux esophagitis    Sleep apnea    Thyroid fullness    Thyroid nodule  Unspecified vitamin D deficiency    Past Surgical History:  Procedure Laterality Date   CHOLECYSTECTOMY     ELBOW FRACTURE SURGERY Right    FOOT SURGERY Left 03/2010   GALLBLADDER SURGERY     GANGLION CYST EXCISION     TOTAL VAGINAL HYSTERECTOMY     Family History  Problem Relation Age of Onset   Heart attack Father 58       MI   Heart disease Father    CAD Father    Breast cancer Maternal Grandmother    Hypertension Brother    Diabetes Brother    Social History   Socioeconomic History   Marital status: Married    Spouse name: Not on file   Number of  children: 3   Years of education: Not on file   Highest education level: Some college, no degree  Occupational History    Comment: self employeed  Tobacco Use   Smoking status: Never   Smokeless tobacco: Never  Vaping Use   Vaping Use: Never used  Substance and Sexual Activity   Alcohol use: Yes    Alcohol/week: 0.0 - 1.0 standard drinks of alcohol   Drug use: No   Sexual activity: Not on file  Other Topics Concern   Not on file  Social History Narrative   Not on file   Social Determinants of Health   Financial Resource Strain: Low Risk  (05/16/2022)   Overall Financial Resource Strain (CARDIA)    Difficulty of Paying Living Expenses: Not hard at all  Food Insecurity: No Food Insecurity (05/16/2022)   Hunger Vital Sign    Worried About Running Out of Food in the Last Year: Never true    Ran Out of Food in the Last Year: Never true  Transportation Needs: No Transportation Needs (05/16/2022)   PRAPARE - Hydrologist (Medical): No    Lack of Transportation (Non-Medical): No  Physical Activity: Sufficiently Active (05/16/2022)   Exercise Vital Sign    Days of Exercise per Week: 3 days    Minutes of Exercise per Session: 50 min  Stress: No Stress Concern Present (05/16/2022)   [place]    Feeling of Stress : Not at all  Social Connections: [place] (05/16/2022)   Social Connection and Isolation Panel [NHANES]    Frequency of Communication with Friends and Family: More than three times a week    Frequency of Social Gatherings with Friends and Family: Three times a week    Attends Religious Services: More than 4 times per year    Active Member of Clubs or Organizations: Yes    Attends Music therapist: More than 4 times per year    Marital Status: Married    Tobacco Counseling Counseling given: Not Answered   Clinical Intake:  Pre-visit preparation  completed: Yes  Pain : No/denies pain     Nutritional Risks: None Diabetes: No  How often do you need to have someone help you when you read instructions, pamphlets, or other written materials from your doctor or pharmacy?: 1 - Never  Diabetic?no  Interpreter Needed?: No  Information entered by :: Kirke Shaggy, LPN   Activities of Daily Living    05/16/2022    9:05 AM 12/16/2021    9:32 AM  In your present state of health, do you have any difficulty performing the following activities:  Hearing? 0 0  Vision? 0 0  Difficulty  concentrating or making decisions? 0 0  Walking or climbing stairs? 0 0  Dressing or bathing? 0 0  Doing errands, shopping? 0 0  Preparing Food and eating ? N   Using the Toilet? N   In the past six months, have you accidently leaked urine? N   Do you have problems with loss of bowel control? N   Managing your Medications? N   Managing your Finances? N   Housekeeping or managing your Housekeeping? N     Patient Care Team: Gwyneth Sprout, FNP as PCP - General (Family Medicine) Murlean Iba, MD (Nephrology) Earnestine Leys, MD (Orthopedic Surgery) Carloyn Manner, MD as Referring Physician (Otolaryngology) Ree Edman, MD (Dermatology)  Indicate any recent Medical Services you may have received from other than [facility] providers in the past year (date may be approximate).     Assessment:   This is a routine wellness examination for Anicia.  Hearing/Vision screen Hearing Screening - Comments:: No aids Vision Screening - Comments:: Readers- [facility]  Dietary issues and exercise activities discussed: Current Exercise Habits: Home exercise routine, Type of exercise: walking, Time (Minutes): 50, Frequency (Times/Week): 3, Weekly Exercise (Minutes/Week): 150, Intensity: Mild   Goals Addressed             This Visit's Progress    DIET - EAT MORE FRUITS AND VEGETABLES         Depression Screen    05/16/2022    9:03 AM  12/16/2021    9:32 AM 05/12/2021    9:46 AM 09/07/2020    9:34 AM 09/16/2019    9:46 AM 07/18/2018    9:19 AM 08/04/2016   10:24 AM  PHQ 2/9 Scores  PHQ - 2 Score 0 0 0 0 0 0 0  PHQ- 9 Score 0 1  0  0 0    Fall Risk    05/16/2022    9:05 AM 12/16/2021    9:32 AM 05/12/2021    9:49 AM 03/08/2021    9:15 AM 09/16/2019    9:50 AM  Fall Risk   Falls in the past year? 0 0 0 0 1  Number falls in past yr: 0 0 0 0 0  Injury with Fall? 0 0 0  1  Risk for fall due to : No Fall Risks  No Fall Risks    Follow up Falls prevention discussed;Falls evaluation completed  Falls prevention discussed Falls prevention discussed Falls prevention discussed    FALL RISK PREVENTION PERTAINING TO THE HOME:  Any stairs in or around the home? Yes  If so, are there any without handrails? No  Home free of loose throw rugs in walkways, pet beds, electrical cords, etc? Yes  Adequate lighting in your home to reduce risk of falls? Yes   ASSISTIVE DEVICES UTILIZED TO PREVENT FALLS:  Life alert? No  Use of a cane, walker or w/c? No  Grab bars in the bathroom? Yes  Shower chair or bench in shower? Yes  Elevated toilet seat or a handicapped toilet? Yes   Cognitive Function:        05/16/2022    9:07 AM  6CIT Screen  What Year? 0 points  What month? 0 points  What time? 0 points  Count back from 20 0 points  Months in reverse 0 points  Repeat phrase 0 points  Total Score 0 points    Immunizations Immunization History  Administered Date(s) Administered   Influenza Split 04/19/2006, 07/17/2009   Influenza, High Dose  Seasonal PF 10/08/2018   Influenza,inj,Quad PF,6+ Mos 02/13/2013, 06/24/2015   Influenza-Unspecified 04/06/2018, 10/08/2018   PFIZER(Purple Top)SARS-COV-2 Vaccination 07/29/2019, 08/20/2019   Pneumococcal Conjugate-13 06/24/2015   Pneumococcal Polysaccharide-23 02/13/2013   Tdap 03/27/2008, 10/08/2018   Zoster, Live 08/03/2012    TDAP status: Up to date  Flu Vaccine status: Declined,  Education has been provided regarding the importance of this vaccine but patient still declined. Advised may receive this vaccine at local pharmacy or Health Dept. Aware to provide a copy of the vaccination record if obtained from local pharmacy or Health Dept. Verbalized acceptance and understanding.  Pneumococcal vaccine status: Up to date  Covid-19 vaccine status: Completed vaccines  Qualifies for Shingles Vaccine? Yes   Zostavax completed Yes   Shingrix Completed?: No.    Education has been provided regarding the importance of this vaccine. Patient has been advised to call insurance company to determine out of pocket expense if they have not yet received this vaccine. Advised may also receive vaccine at local pharmacy or Health Dept. Verbalized acceptance and understanding.  Screening Tests Health Maintenance  Topic Date Due   Diabetic kidney evaluation - Urine ACR  Never done   Zoster Vaccines- Shingrix (1 of 2) Never done   Pneumonia Vaccine 41+ Years old (3 - PPSV23 or PCV20) 06/28/2018   INFLUENZA VACCINE  01/04/2022   COVID-19 Vaccine (3 - 2023-24 season) 02/04/2022   MAMMOGRAM  08/24/2022   Diabetic kidney evaluation - eGFR measurement  12/17/2022   Medicare Annual Wellness (AWV)  05/17/2023   Fecal DNA (Cologuard)  01/18/2025   DTaP/Tdap/Td (3 - Td or Tdap) 10/07/2028   DEXA SCAN  Completed   Hepatitis C Screening  Completed   HPV VACCINES  Aged Out   COLONOSCOPY (Pts 45-28yr Insurance coverage will need to be confirmed)  Discontinued    Health Maintenance  Health Maintenance Due  Topic Date Due   Diabetic kidney evaluation - Urine ACR  Never done   Zoster Vaccines- Shingrix (1 of 2) Never done   Pneumonia Vaccine 68 Years old (3 - PPSV23 or PCV20) 06/28/2018   INFLUENZA VACCINE  01/04/2022   COVID-19 Vaccine (3 - 2023-24 season) 02/04/2022    Colorectal cancer screening: Type of screening: Cologuard. Completed 01/24/22. Repeat every 3 years  Mammogram status:  Completed 08/23/21. Repeat every year  Bone Density status: Completed 08/23/21. Results reflect: Bone density results: NORMAL. Repeat every 5 years.  Lung Cancer Screening: (Low Dose CT Chest recommended if Age 68-80years, 30 pack-year currently smoking OR have quit w/in 15years.) does not qualify.   Additional Screening:  Hepatitis C Screening: does qualify; Completed 04/25/12  Vision Screening: Recommended annual ophthalmology exams for early detection of glaucoma and other disorders of the eye. Is the patient up to date with their annual eye exam?  Yes  Who is the provider or what is the name of the office in which the patient attends annual eye exams? [place] [facility] is not established with a provider, would they like to be referred to a provider to establish care? No .   Dental Screening: Recommended annual dental exams for proper oral hygiene  Community Resource Referral / Chronic Care Management: CRR required this visit?  No   CCM required this visit?  No      Plan:     I have personally reviewed and noted the following in the patient's chart:   Medical and social history Use of alcohol, tobacco or illicit drugs  Current medications  and supplements including opioid prescriptions. Patient is not currently taking opioid prescriptions. Functional ability and status Nutritional status Physical activity Advanced directives List of other physicians Hospitalizations, surgeries, and ER visits in previous 12 months Vitals Screenings to include cognitive, depression, and falls Referrals and appointments  In addition, I have reviewed and discussed with patient certain preventive protocols, quality metrics, and best practice recommendations. A written personalized care plan for preventive services as well as general preventive health recommendations were provided to patient.     Dionisio David, LPN   [id number]   Nurse Notes: none

## 2022-06-02 ENCOUNTER — Other Ambulatory Visit: Payer: Self-pay | Admitting: Family Medicine

## 2022-06-02 MED ORDER — OLOPATADINE HCL 0.1 % OP SOLN: 1.0000 [drp] | Freq: Two times a day (BID) | OPHTHALMIC | 12 refills | Status: DC

## 2022-06-13 DIAGNOSIS — L2389 Allergic contact dermatitis due to other agents: Secondary | ICD-10-CM | POA: Diagnosis not present

## 2022-07-16 ENCOUNTER — Other Ambulatory Visit: Payer: Self-pay | Admitting: Family Medicine

## 2022-07-16 DIAGNOSIS — E559 Vitamin D deficiency, unspecified: Secondary | ICD-10-CM

## 2022-08-10 ENCOUNTER — Encounter: Payer: Self-pay | Admitting: Family Medicine

## 2022-08-11 ENCOUNTER — Other Ambulatory Visit: Payer: Self-pay | Admitting: Family Medicine

## 2022-08-14 ENCOUNTER — Other Ambulatory Visit: Payer: Self-pay | Admitting: Cardiovascular Disease

## 2022-08-14 DIAGNOSIS — Z23 Encounter for immunization: Secondary | ICD-10-CM | POA: Insufficient documentation

## 2022-08-14 DIAGNOSIS — Z1231 Encounter for screening mammogram for malignant neoplasm of breast: Secondary | ICD-10-CM | POA: Insufficient documentation

## 2022-08-14 DIAGNOSIS — Z780 Asymptomatic menopausal state: Secondary | ICD-10-CM | POA: Insufficient documentation

## 2022-08-14 MED ORDER — LOSARTAN POTASSIUM 50 MG PO TABS: 50.0000 mg | ORAL_TABLET | Freq: Every day | ORAL | 1 refills | Status: DC

## 2022-08-14 NOTE — Progress Notes (Unsigned)
I,Sha'taria Tyson,acting as a Education administrator for Gwyneth Sprout, FNP.,have documented all relevant documentation on the behalf of Gwyneth Sprout, FNP,as directed by  Gwyneth Sprout, FNP while in the presence of Gwyneth Sprout, FNP.  Established patient visit  Patient: [name]   DOB: Oct 10, 1955   67 y.o. Female  MRN: [id number] Visit Date: 08/15/2022  Today's healthcare provider: Gwyneth Sprout, FNP  Re Introduced to nurse practitioner role and practice setting.  All questions answered.  Discussed provider/patient relationship and expectations.  Subjective    HPI  -Declined all vaccines. Advised if ineterested in Shingles vaccine to receive at pharmacy.   Hypothyroid, follow-up  Lab Results  Component Value Date   TSH 1.470 12/16/2021   TSH 4.770 (H) 09/07/2020   TSH 3.910 09/30/2019   FREET4 1.50 12/16/2021   T4TOTAL 6.9 12/10/2018    Wt Readings from Last 3 Encounters:  08/15/22 240 lb 8 oz (109.1 kg)  05/16/22 236 lb (107 kg)  12/16/21 236 lb (107 kg)    She was last seen for hypothyroid 8 months ago.  Management since that visit includes synthroid 50 mcg. She reports excellent compliance with treatment. She is not having side effects.   Symptoms: No change in energy level No constipation  No diarrhea No heat / cold intolerance  No nervousness No palpitations  No weight changes    Vitamin D deficiency, follow-up  Lab Results  Component Value Date   VD25OH 32.5 12/16/2021   VD25OH 41.4 09/07/2020   VD25OH 33.9 12/10/2018   CALCIUM 9.3 12/16/2021   CALCIUM 9.3 09/07/2020        Wt Readings from Last 3 Encounters:  08/15/22 240 lb 8 oz (109.1 kg)  05/16/22 236 lb (107 kg)  12/16/21 236 lb (107 kg)    She was last seen for vitamin D deficiency 8 months ago.  Management since that visit includes continue current treatment. She reports excellent compliance with treatment. She is not having side effects.   Symptoms: No change in energy level No numbness or  tingling  No bone pain No unexplained fracture   ---------------------------------------------------------------------------------------------------  ---------------------------------------------------------------------------------------------------  Medications: Outpatient Medications Prior to Visit  Medication Sig   dapagliflozin propanediol (FARXIGA) 10 MG TABS tablet Take 1 tablet (10 mg total) by mouth daily before breakfast.   levothyroxine (SYNTHROID) 50 MCG tablet Take 1 tablet (50 mcg total) by mouth every morning.   losartan (COZAAR) 50 MG tablet Take 1 tablet (50 mg total) by mouth daily.   NON FORMULARY CPAP nightly.   olopatadine (PATANOL) 0.1 % ophthalmic solution Place 1 drop into both eyes 2 (two) times daily.   [DISCONTINUED] albuterol (VENTOLIN HFA) 108 (90 Base) MCG/ACT inhaler Inhale 2 puffs into the lungs every 6 (six) hours as needed.   [DISCONTINUED] allopurinol (ZYLOPRIM) 100 MG tablet TAKE 1 TABLET BY MOUTH ONCE DAILY   [DISCONTINUED] clobetasol (TEMOVATE) 0.05 % external solution Apply 1 Application topically as needed.   [DISCONTINUED] fluticasone (FLONASE) 50 MCG/ACT nasal spray USE 2 SPRAYS INTO EACH NOSTRIL ONCE DAILY AS DIRECTED.   [DISCONTINUED] furosemide (LASIX) 40 MG tablet Take 1 tablet (40 mg total) by mouth daily.   [DISCONTINUED] ibuprofen (IBU) 600 MG tablet TAKE 1 TABLET BY MOUTH EVERY 8 HOURS AS NEEDED.   [DISCONTINUED] montelukast (SINGULAIR) 10 MG tablet TAKE 1 TABLET BY MOUTH AT BEDTIME   [DISCONTINUED] potassium chloride (KLOR-CON M) 10 MEQ tablet TAKE TWO TABLETS TWICE A DAY   [DISCONTINUED] Vitamin D,  Ergocalciferol, (DRISDOL) 1.25 MG (50000 UNIT) CAPS capsule TAKE 1 CAPSULE BY MOUTH ONCE A WEEK   No facility-administered medications prior to visit.    Review of Systems  Last CBC Lab Results  Component Value Date   WBC 4.9 12/16/2021   HGB 12.8 12/16/2021   HCT 39.6 12/16/2021   MCV 88 12/16/2021   MCH 28.6 12/16/2021   RDW 13.2  12/16/2021   PLT 178 [id number]   Last metabolic panel Lab Results  Component Value Date   GLUCOSE 97 12/16/2021   NA 142 12/16/2021   K 4.2 12/16/2021   CL 101 12/16/2021   CO2 24 12/16/2021   BUN 20 12/16/2021   CREATININE 1.17 (H) 12/16/2021   EGFR 51 (L) 12/16/2021   CALCIUM 9.3 12/16/2021   PROT 6.6 12/16/2021   ALBUMIN 4.5 12/16/2021   LABGLOB 2.1 12/16/2021   AGRATIO 2.1 12/16/2021   BILITOT 0.6 12/16/2021   ALKPHOS 84 12/16/2021   AST 19 12/16/2021   ALT 15 12/16/2021   Last lipids Lab Results  Component Value Date   CHOL 180 12/16/2021   HDL 63 12/16/2021   LDLCALC 104 (H) 12/16/2021   TRIG 72 12/16/2021   CHOLHDL 2.9 12/16/2021   Last hemoglobin A1c Lab Results  Component Value Date   HGBA1C 6.1 (H) 12/16/2021   Last thyroid functions Lab Results  Component Value Date   TSH 1.470 12/16/2021   T4TOTAL 6.9 12/10/2018   Last vitamin D Lab Results  Component Value Date   VD25OH 32.5 12/16/2021       Objective    BP (!) 133/52 (BP Location: Right Arm, Patient Position: Sitting, Cuff Size: Large)   Pulse (!) 56   Wt 240 lb 8 oz (109.1 kg)   SpO2 100%   BMI 36.57 kg/m   BP Readings from Last 3 Encounters:  08/15/22 (!) 133/52  12/16/21 132/64  06/23/21 (!) 152/80   Wt Readings from Last 3 Encounters:  08/15/22 240 lb 8 oz (109.1 kg)  05/16/22 236 lb (107 kg)  12/16/21 236 lb (107 kg)   SpO2 Readings from Last 3 Encounters:  08/15/22 100%  12/16/21 98%  06/23/21 98%      Physical Exam Vitals and nursing note reviewed.  Constitutional:      General: She is not in acute distress.    Appearance: Normal appearance. She is obese. She is not ill-appearing, toxic-appearing or diaphoretic.  HENT:     Head: Normocephalic and atraumatic.  Cardiovascular:     Rate and Rhythm: Regular rhythm. Bradycardia present.     Pulses: Normal pulses.     Heart sounds: Normal heart sounds. No murmur heard.    No friction rub. No gallop.  Pulmonary:      Effort: Pulmonary effort is normal. No respiratory distress.     Breath sounds: Normal breath sounds. No stridor. No wheezing, rhonchi or rales.  Chest:     Chest wall: No tenderness.  Musculoskeletal:        General: Swelling present. No tenderness, deformity or signs of injury. Normal range of motion.     Right lower leg: 2+ Pitting Edema present.     Left lower leg: 2+ Pitting Edema present.  Skin:    General: Skin is warm and dry.     Capillary Refill: Capillary refill takes less than 2 seconds.     Coloration: Skin is not jaundiced or pale.     Findings: No bruising, erythema, lesion or rash.  Neurological:  General: No focal deficit present.     Mental Status: She is alert and oriented to person, place, and time. Mental status is at baseline.     Cranial Nerves: No cranial nerve deficit.     Sensory: No sensory deficit.     Motor: No weakness.     Coordination: Coordination normal.  Psychiatric:        Mood and Affect: Mood normal.        Behavior: Behavior normal.        Thought Content: Thought content normal.        Judgment: Judgment normal.     No results found for any visits on 08/15/22.  Assessment & Plan     Problem List Items Addressed This Visit       Endocrine   Hypothyroidism due to acquired atrophy of thyroid    Chronic, stable Slight weight gain Repeat TSH Previously on 50 mcg synthroid       Relevant Orders   TSH     Musculoskeletal and Integument   Psoriasis    Chronic, stable Request for refills       Relevant Medications   clobetasol (TEMOVATE) 0.05 % external solution   Strain of lumbar region    Chronic lumbar pain, request for refills; condition stable.      Relevant Medications   ibuprofen (IBU) 600 MG tablet     Genitourinary   Chronic kidney disease (CKD), stage III (moderate) (HCC) - Primary    Chronic, stable Followed by cards and nephro LE edema +2 on exam; pt notes "playing catchup" from weekend and traveling; did  not take fluid pill(s) over w/e      Relevant Orders   Basic Metabolic Panel (BMET)     Other   Class 2 severe obesity due to excess calories with serious comorbidity and body mass index (BMI) of 36.0 to 36.9 in adult [facility])    Body mass index is 36.57 kg/m. Continue to recommend balanced, lower carb meals. Smaller meal size, adding snacks. Choosing water as drink of choice and increasing purposeful exercise.       Edema    Chronic, +2 today Continue lasix with supplemental K+      Relevant Medications   furosemide (LASIX) 40 MG tablet   Elevated LDL cholesterol level    Chronic, previously elevated Repeat LP Goal LDL <100      Relevant Orders   Lipid panel   Encounter for screening mammogram for malignant neoplasm of breast    Due for screening for mammogram, denies breast concerns, provided with phone number to call and schedule appointment for mammogram. Encouraged to repeat breast cancer screening every 1-2 years. Please call and schedule your mammogram:  [facility]  [place], [place],  [place]  [id number] Get Driving Directions Main: [phone]  Sunday:Closed Monday:7:20 AM - 5:00 PM Tuesday:7:20 AM - 5:00 PM Wednesday:7:20 AM - 5:00 PM Thursday:7:20 AM - 5:00 PM Friday:7:20 AM - 4:30 PM Saturday:Closed       Relevant Orders   MM 3D SCREENING MAMMOGRAM BILATERAL BREAST   Gout    Chronic, stable Request for refills      Relevant Medications   allopurinol (ZYLOPRIM) 100 MG tablet   RESOLVED: Need for pneumococcal vaccine   RESOLVED: Postmenopausal   Prediabetes    Chronic, stable Repeat A1c      Relevant Orders   Basic Metabolic Panel (BMET)   CBC  Hemoglobin A1c   Vitamin D deficiency    Chronic, unknown Repeat labs for continued supplements       Relevant Medications   Vitamin D, Ergocalciferol, (DRISDOL) 1.25 MG (50000 UNIT) CAPS capsule   Other Relevant  Orders   Vitamin D (25 hydroxy)   Other Visit Diagnoses     Mild intermittent asthma without complication       Relevant Medications   albuterol (VENTOLIN HFA) 108 (90 Base) MCG/ACT inhaler   montelukast (SINGULAIR) 10 MG tablet   Seasonal allergic rhinitis due to pollen       Relevant Medications   fluticasone (FLONASE) 50 MCG/ACT nasal spray   montelukast (SINGULAIR) 10 MG tablet      Return in about 4 months (around 12/15/2022) for annual examination.     Vonna Kotyk, FNP, have reviewed all documentation for this visit. The documentation on 08/15/22 for the exam, diagnosis, procedures, and orders are all accurate and complete.  Gwyneth Sprout, [place] [phone] (phone) [phone] (fax)  [place]

## 2022-08-15 ENCOUNTER — Ambulatory Visit (INDEPENDENT_AMBULATORY_CARE_PROVIDER_SITE_OTHER): Payer: PPO | Admitting: Family Medicine

## 2022-08-15 VITALS — BP 133/52 | HR 56 | Wt 240.5 lb

## 2022-08-15 DIAGNOSIS — L409 Psoriasis, unspecified: Secondary | ICD-10-CM | POA: Diagnosis not present

## 2022-08-15 DIAGNOSIS — Z6836 Body mass index (BMI) 36.0-36.9, adult: Secondary | ICD-10-CM

## 2022-08-15 DIAGNOSIS — N1831 Chronic kidney disease, stage 3a: Secondary | ICD-10-CM | POA: Diagnosis not present

## 2022-08-15 DIAGNOSIS — J301 Allergic rhinitis due to pollen: Secondary | ICD-10-CM

## 2022-08-15 DIAGNOSIS — Z1231 Encounter for screening mammogram for malignant neoplasm of breast: Secondary | ICD-10-CM | POA: Diagnosis not present

## 2022-08-15 DIAGNOSIS — R7303 Prediabetes: Secondary | ICD-10-CM

## 2022-08-15 DIAGNOSIS — E034 Atrophy of thyroid (acquired): Secondary | ICD-10-CM | POA: Diagnosis not present

## 2022-08-15 DIAGNOSIS — R601 Generalized edema: Secondary | ICD-10-CM

## 2022-08-15 DIAGNOSIS — Z23 Encounter for immunization: Secondary | ICD-10-CM | POA: Diagnosis not present

## 2022-08-15 DIAGNOSIS — Z780 Asymptomatic menopausal state: Secondary | ICD-10-CM

## 2022-08-15 DIAGNOSIS — E7800 Pure hypercholesterolemia, unspecified: Secondary | ICD-10-CM | POA: Diagnosis not present

## 2022-08-15 DIAGNOSIS — E559 Vitamin D deficiency, unspecified: Secondary | ICD-10-CM

## 2022-08-15 DIAGNOSIS — M1A09X0 Idiopathic chronic gout, multiple sites, without tophus (tophi): Secondary | ICD-10-CM | POA: Diagnosis not present

## 2022-08-15 DIAGNOSIS — S39012D Strain of muscle, fascia and tendon of lower back, subsequent encounter: Secondary | ICD-10-CM

## 2022-08-15 DIAGNOSIS — N1832 Chronic kidney disease, stage 3b: Secondary | ICD-10-CM

## 2022-08-15 DIAGNOSIS — J4520 Mild intermittent asthma, uncomplicated: Secondary | ICD-10-CM | POA: Diagnosis not present

## 2022-08-15 MED ORDER — MONTELUKAST SODIUM 10 MG PO TABS: 10.0000 mg | ORAL_TABLET | Freq: Every day | ORAL | 3 refills | Status: DC

## 2022-08-15 MED ORDER — VITAMIN D (ERGOCALCIFEROL) 1.25 MG (50000 UNIT) PO CAPS: 50000.0000 [IU] | ORAL_CAPSULE | ORAL | 0 refills | Status: DC

## 2022-08-15 MED ORDER — FUROSEMIDE 40 MG PO TABS: 40.0000 mg | ORAL_TABLET | Freq: Every day | ORAL | 3 refills | Status: DC

## 2022-08-15 MED ORDER — IBUPROFEN 600 MG PO TABS: ORAL_TABLET | ORAL | 3 refills | Status: AC

## 2022-08-15 MED ORDER — CLOBETASOL PROPIONATE 0.05 % EX SOLN: 1.0000 | CUTANEOUS | 5 refills | Status: DC | PRN

## 2022-08-15 MED ORDER — FLUTICASONE PROPIONATE 50 MCG/ACT NA SUSP: NASAL | 12 refills | Status: DC

## 2022-08-15 MED ORDER — POTASSIUM CHLORIDE CRYS ER 10 MEQ PO TBCR: 20.0000 meq | EXTENDED_RELEASE_TABLET | Freq: Two times a day (BID) | ORAL | 3 refills | Status: DC

## 2022-08-15 MED ORDER — ALBUTEROL SULFATE HFA 108 (90 BASE) MCG/ACT IN AERS: 2.0000 | INHALATION_SPRAY | Freq: Four times a day (QID) | RESPIRATORY_TRACT | 5 refills | Status: DC | PRN

## 2022-08-15 MED ORDER — ALLOPURINOL 100 MG PO TABS: 100.0000 mg | ORAL_TABLET | Freq: Every day | ORAL | 3 refills | Status: DC

## 2022-08-15 NOTE — Assessment & Plan Note (Signed)
Chronic, stable Request for refills 

## 2022-08-15 NOTE — Assessment & Plan Note (Signed)
Chronic, unknown Repeat labs for continued supplements

## 2022-08-15 NOTE — Assessment & Plan Note (Signed)
Due for screening for mammogram, denies breast concerns, provided with phone number to call and schedule appointment for mammogram. Encouraged to repeat breast cancer screening every 1-2 years.  Please call and schedule your mammogram:  [facility]  [address], [address] [facility] [place],  [place]  [address] Get Driving Directions Main: [phone]  Sunday:Closed Monday:7:20 AM - 5:00 PM Tuesday:7:20 AM - 5:00 PM Wednesday:7:20 AM - 5:00 PM Thursday:7:20 AM - 5:00 PM Friday:7:20 AM - 4:30 PM Saturday:Closed  

## 2022-08-15 NOTE — Assessment & Plan Note (Signed)
Chronic, +2 today Continue lasix with supplemental K+

## 2022-08-15 NOTE — Assessment & Plan Note (Signed)
Body mass index is 36.57 kg/m. Continue to recommend balanced, lower carb meals. Smaller meal size, adding snacks. Choosing water as drink of choice and increasing purposeful exercise.

## 2022-08-15 NOTE — Assessment & Plan Note (Signed)
Chronic, stable Slight weight gain Repeat TSH Previously on 50 mcg synthroid

## 2022-08-15 NOTE — Assessment & Plan Note (Signed)
Chronic, stable Followed by cards and nephro LE edema +2 on exam; pt notes "playing catchup" from weekend and traveling; did not take fluid pill(s) over w/e

## 2022-08-15 NOTE — Assessment & Plan Note (Signed)
Chronic, stable Repeat A1c

## 2022-08-15 NOTE — Assessment & Plan Note (Signed)
Chronic lumbar pain, request for refills; condition stable.

## 2022-08-15 NOTE — Assessment & Plan Note (Signed)
Chronic, previously elevated Repeat LP Goal LDL <100

## 2022-08-15 NOTE — Patient Instructions (Signed)
Please contact [phone] to schedule your mammogram. You will be asked your location preference to have procedure performed. You have two options listed below.  1) [facility] located at [address] [place], [place] [address] 2) [facility] [place] located at [address] [place],  [address]  Upon results being received our office will contact you. As well as all results can be viewed through your MyChart. Please feel free to contact us if you have any further questions or concerns.   

## 2022-08-16 LAB — BASIC METABOLIC PANEL
BUN/Creatinine Ratio: 19 (ref 12–28)
BUN: 25 mg/dL (ref 8–27)
CO2: 26 mmol/L (ref 20–29)
Calcium: 9.8 mg/dL (ref 8.7–10.3)
Chloride: 100 mmol/L (ref 96–106)
Creatinine, Ser: 1.31 mg/dL — ABNORMAL HIGH (ref 0.57–1.00)
Glucose: 127 mg/dL — ABNORMAL HIGH (ref 70–99)
Potassium: 3.7 mmol/L (ref 3.5–5.2)
Sodium: 142 mmol/L (ref 134–144)
eGFR: 44 mL/min/{1.73_m2} — ABNORMAL LOW (ref >59)

## 2022-08-16 LAB — LIPID PANEL
Chol/HDL Ratio: 2.8 ratio (ref 0.0–4.4)
Cholesterol, Total: 199 mg/dL (ref 100–199)
HDL: 70 mg/dL (ref >39)
LDL Chol Calc (NIH): 115 mg/dL — ABNORMAL HIGH (ref 0–99)
Triglycerides: 76 mg/dL (ref 0–149)
VLDL Cholesterol Cal: 14 mg/dL (ref 5–40)

## 2022-08-16 LAB — CBC
Hematocrit: 40.3 % (ref 34.0–46.6)
Hemoglobin: 13.2 g/dL (ref 11.1–15.9)
MCH: 28.3 pg (ref 26.6–33.0)
MCHC: 32.8 g/dL (ref 31.5–35.7)
MCV: 87 fL (ref 79–97)
Platelets: 200 10*3/uL (ref 150–450)
RBC: 4.66 x10E6/uL (ref 3.77–5.28)
RDW: 12.9 % (ref 11.7–15.4)
WBC: 6 10*3/uL (ref 3.4–10.8)

## 2022-08-16 LAB — TSH: TSH: 5.37 u[IU]/mL — ABNORMAL HIGH (ref 0.450–4.500)

## 2022-08-16 LAB — HEMOGLOBIN A1C
Est. average glucose Bld gHb Est-mCnc: 140 mg/dL
Hgb A1c MFr Bld: 6.5 % — ABNORMAL HIGH (ref 4.8–5.6)

## 2022-08-16 LAB — VITAMIN D 25 HYDROXY (VIT D DEFICIENCY, FRACTURES): Vit D, 25-Hydroxy: 48.6 ng/mL (ref 30.0–100.0)

## 2022-08-17 ENCOUNTER — Other Ambulatory Visit: Payer: Self-pay | Admitting: Physician Assistant

## 2022-08-17 DIAGNOSIS — E034 Atrophy of thyroid (acquired): Secondary | ICD-10-CM

## 2022-08-17 MED ORDER — LEVOTHYROXINE SODIUM 75 MCG PO TABS: 75.0000 ug | ORAL_TABLET | Freq: Every day | ORAL | 3 refills | Status: DC

## 2022-08-17 NOTE — Progress Notes (Signed)
Blood count is normal. Kidney function is slightly worsened. Advised to drink a lot of water. Liver enzymes are normal. Sodium, potassium, and calcium are normal. Thyroid is just slightly under-corrected. May benefit by increasing levothyroxine to 64mg from 515m. I can send that in if agreeable. Will need to recheck thyroid in 8 weeks. Cholesterol increased just slightly. Would not make changes, just limit fatty foods, red meats, and sugars in diet. A1c increased to now 6.5. Again dietary changes can help. If you agree, I could call in a medication for DMII. Vit D is normal. JaMardene SpeakPALake Region Healthcare CorpMMS BuLifecare Behavioral Health [phone]

## 2022-10-13 ENCOUNTER — Ambulatory Visit: Payer: PPO | Admitting: Family Medicine

## 2022-11-04 ENCOUNTER — Ambulatory Visit: Payer: PPO | Admitting: Family Medicine

## 2022-11-09 ENCOUNTER — Ambulatory Visit (INDEPENDENT_AMBULATORY_CARE_PROVIDER_SITE_OTHER): Payer: PPO | Admitting: Family Medicine

## 2022-11-09 ENCOUNTER — Telehealth: Payer: Self-pay | Admitting: Family Medicine

## 2022-11-09 ENCOUNTER — Encounter: Payer: Self-pay | Admitting: Family Medicine

## 2022-11-09 VITALS — BP 123/76 | HR 55 | Temp 98.2°F | Resp 12 | Ht 68.0 in | Wt 240.0 lb

## 2022-11-09 DIAGNOSIS — Z6836 Body mass index (BMI) 36.0-36.9, adult: Secondary | ICD-10-CM

## 2022-11-09 DIAGNOSIS — E7800 Pure hypercholesterolemia, unspecified: Secondary | ICD-10-CM

## 2022-11-09 DIAGNOSIS — H01116 Allergic dermatitis of left eye, unspecified eyelid: Secondary | ICD-10-CM

## 2022-11-09 DIAGNOSIS — N1832 Chronic kidney disease, stage 3b: Secondary | ICD-10-CM

## 2022-11-09 DIAGNOSIS — R7303 Prediabetes: Secondary | ICD-10-CM | POA: Diagnosis not present

## 2022-11-09 DIAGNOSIS — N1831 Chronic kidney disease, stage 3a: Secondary | ICD-10-CM | POA: Diagnosis not present

## 2022-11-09 DIAGNOSIS — E034 Atrophy of thyroid (acquired): Secondary | ICD-10-CM

## 2022-11-09 LAB — POCT GLYCOSYLATED HEMOGLOBIN (HGB A1C)
Est. average glucose Bld gHb Est-mCnc: 126
Hemoglobin A1C: 6 % — AB (ref 4.0–5.6)

## 2022-11-09 MED ORDER — CLOCORTOLONE PIVALATE 0.1 % EX CREA
1.0000 | TOPICAL_CREAM | Freq: Two times a day (BID) | CUTANEOUS | 0 refills | Status: DC
Start: 2022-11-09 — End: 2024-01-31

## 2022-11-09 NOTE — Progress Notes (Signed)
I,Sulibeya S Dimas,acting as a Neurosurgeon for Jacky Kindle, FNP.,have documented all relevant documentation on the behalf of Jacky Kindle, FNP,as directed by  Jacky Kindle, FNP while in the presence of Jacky Kindle, FNP.   Established patient visit  Patient: [name]   DOB: May 02, 1956   66 y.o. Female  MRN: [id number] Visit Date: 11/09/2022  Today's healthcare provider: Jacky Kindle, FNP  Re Introduced to nurse practitioner role and practice setting.  All questions answered.  Discussed provider/patient relationship and expectations.  Subjective    HPI  Hypothyroid, follow-up  Lab Results  Component Value Date   TSH 5.370 (H) 08/15/2022   TSH 1.470 12/16/2021   TSH 4.770 (H) 09/07/2020   FREET4 1.50 12/16/2021   T4TOTAL 6.9 12/10/2018    Wt Readings from Last 3 Encounters:  11/09/22 240 lb (108.9 kg)  08/15/22 240 lb 8 oz (109.1 kg)  05/16/22 236 lb (107 kg)    She was last seen for hypothyroid 3 months ago.  Management since that visit includes no changes continue levothyroxine 75 mcg daily She reports excellent compliance with treatment. She is not having side effects.   Symptoms: Yes change in energy level No constipation  No diarrhea No heat / cold intolerance  No nervousness No palpitations  No weight changes    -----------------------------------------------------------------------------------------  Medications: Outpatient Medications Prior to Visit  Medication Sig   albuterol (VENTOLIN HFA) 108 (90 Base) MCG/ACT inhaler Inhale 2 puffs into the lungs every 6 (six) hours as needed.   allopurinol (ZYLOPRIM) 100 MG tablet Take 1 tablet (100 mg total) by mouth daily.   clobetasol (TEMOVATE) 0.05 % external solution Apply 1 Application topically as needed.   dapagliflozin propanediol (FARXIGA) 10 MG TABS tablet Take 1 tablet (10 mg total) by mouth daily before breakfast.   fluticasone (FLONASE) 50 MCG/ACT nasal spray USE 2 SPRAYS INTO EACH NOSTRIL ONCE  DAILY AS DIRECTED.   furosemide (LASIX) 40 MG tablet Take 1 tablet (40 mg total) by mouth daily.   ibuprofen (IBU) 600 MG tablet TAKE 1 TABLET BY MOUTH EVERY 8 HOURS AS NEEDED.   levothyroxine (SYNTHROID) 75 MCG tablet Take 1 tablet (75 mcg total) by mouth daily.   losartan (COZAAR) 50 MG tablet Take 1 tablet (50 mg total) by mouth daily.   montelukast (SINGULAIR) 10 MG tablet Take 1 tablet (10 mg total) by mouth at bedtime.   NON FORMULARY CPAP nightly.   olopatadine (PATANOL) 0.1 % ophthalmic solution Place 1 drop into both eyes 2 (two) times daily.   potassium chloride (KLOR-CON M) 10 MEQ tablet Take 2 tablets (20 mEq total) by mouth 2 (two) times daily.   Vitamin D, Ergocalciferol, (DRISDOL) 1.25 MG (50000 UNIT) CAPS capsule Take 1 capsule (50,000 Units total) by mouth once a week.   No facility-administered medications prior to visit.    Review of Systems  Constitutional:  Positive for fatigue. Negative for appetite change, chills and unexpected weight change.  Respiratory:  Negative for cough, chest tightness and shortness of breath.   Gastrointestinal:  Negative for abdominal pain, constipation, diarrhea, nausea and vomiting.  Endocrine: Negative for cold intolerance and heat intolerance.   Discussed the use of AI scribe software for clinical note transcription with the patient, who gave verbal consent to proceed.  History of Present Illness   The patient, with a history of kidney function decline, elevated cholesterol, increased A1c, and low functioning thyroid, presents for a follow-up visit. The patient's  creatinine had elevated from 1.17 to 1.31 since last summer, indicating a decrease in kidney function. The patient's cholesterol had also slightly increased from 104 to 115. The patient's A1c had increased from 6.1 to 6.5, the highest it has been. The patient's thyroid-stimulating hormone (TSH) had also increased, indicating low thyroid function. The patient had been prescribed a  lower dose of thyroid medication by mistake but has since returned to the correct dose of 75.  The patient also mentions a skin condition that affects their eyelid, causing inflammation and blistering. The patient has been using a sample medication from their dermatologist, which has been effective. The patient also mentions a plateau in their weight loss efforts, despite making dietary changes. The patient does not take ibuprofen daily, only as needed, and denies any alcohol use.  The patient has been making efforts to improve their health, including drinking more water and wearing compression socks for swelling in their legs. The patient also mentions having a home glucose monitoring device and is aware of the need for fasting checks.      Last CBC Lab Results  Component Value Date   WBC 6.0 08/15/2022   HGB 13.2 08/15/2022   HCT 40.3 08/15/2022   MCV 87 08/15/2022   MCH 28.3 08/15/2022   RDW 12.9 08/15/2022   PLT 200 08/15/2022   Last metabolic panel Lab Results  Component Value Date   GLUCOSE 127 (H) 08/15/2022   NA 142 08/15/2022   K 3.7 08/15/2022   CL 100 08/15/2022   CO2 26 08/15/2022   BUN 25 08/15/2022   CREATININE 1.31 (H) 08/15/2022   EGFR 44 (L) 08/15/2022   CALCIUM 9.8 08/15/2022   PROT 6.6 12/16/2021   ALBUMIN 4.5 12/16/2021   LABGLOB 2.1 12/16/2021   AGRATIO 2.1 12/16/2021   BILITOT 0.6 12/16/2021   ALKPHOS 84 12/16/2021   AST 19 12/16/2021   ALT 15 12/16/2021   Last lipids Lab Results  Component Value Date   CHOL 199 08/15/2022   HDL 70 08/15/2022   LDLCALC 115 (H) 08/15/2022   TRIG 76 08/15/2022   CHOLHDL 2.8 08/15/2022   Last hemoglobin A1c Lab Results  Component Value Date   HGBA1C 6.0 (A) 11/09/2022   Last thyroid functions Lab Results  Component Value Date   TSH 5.370 (H) 08/15/2022   T4TOTAL 6.9 12/10/2018   Last vitamin D Lab Results  Component Value Date   VD25OH 48.6 08/15/2022   Last vitamin B12 and Folate No results found  for: "VITAMINB12", "FOLATE"     Objective    BP 123/76 (BP Location: Left Arm, Patient Position: Sitting, Cuff Size: Large)   Pulse (!) 55   Temp 98.2 F (36.8 C) (Temporal)   Resp 12   Ht 5\' 8"  (1.727 m)   Wt 240 lb (108.9 kg)   BMI 36.49 kg/m   BP Readings from Last 3 Encounters:  11/09/22 123/76  08/15/22 (!) 133/52  12/16/21 132/64   Wt Readings from Last 3 Encounters:  11/09/22 240 lb (108.9 kg)  08/15/22 240 lb 8 oz (109.1 kg)  05/16/22 236 lb (107 kg)   SpO2 Readings from Last 3 Encounters:  08/15/22 100%  12/16/21 98%  06/23/21 98%   Results for orders placed or performed in visit on 11/09/22  POCT glycosylated hemoglobin (Hb A1C)  Result Value Ref Range   Hemoglobin A1C 6.0 (A) 4.0 - 5.6 %   Est. average glucose Bld gHb Est-mCnc 126     Assessment &  Plan    Assessment and Plan    Chronic Kidney Disease: Elevated creatinine from 1.17 to 1.31 and decreased GFR from 51 to 44 mL/min. Discussed potential causes including ibuprofen use and diabetes. -Check kidney function today. -Consider reducing ibuprofen use.  Hyperlipidemia: Slight increase in LDL from 104 to 115. -Check lipid panel today.  Prediabetes: A1c increased from 6.1 to 6.5, but recent fingerstick showed improvement to 6.0. -Check A1c and glucose today. -Continue lifestyle modifications including increased water intake.  Hypothyroidism: Elevated TSH indicating low thyroid function. Patient recently corrected medication dose from 50 to 75. -Check TSH today. -Continue Levothyroxine daily.  Weight Management: Patient reports weight plateau despite dietary modifications. -Consider using a food tracking app to monitor macronutrients. -Consider starting Bupropion and Naltrexone to help with cravings and appetite.  Allergic Dermatitis: Patient reports intermittent eyelid inflammation and blistering. -Prescribe Cloderm cream. -Ensure patient is maintaining good hygiene with makeup and  brushes.  Lower Extremity Edema: Patient reports worse in heat and left worse than right. Wearing compression socks and elevating legs when possible. -Continue current management strategies.  Follow-up in 3 months or sooner depending on lab results.      No follow-ups on file.     Leilani Merl, FNP, have reviewed all documentation for this visit. The documentation on 11/09/22 for the exam, diagnosis, procedures, and orders are all accurate and complete.  Jacky Kindle, FNP  [facility] [phone] (phone) [phone] (fax)  [facility]

## 2022-11-09 NOTE — Patient Instructions (Signed)
The CDC recommends two doses of Shingrix (the new shingles vaccine) separated by 2 to 6 months for adults age 69 years and older. I recommend checking with your insurance plan regarding coverage for this vaccine.    

## 2022-11-09 NOTE — Telephone Encounter (Signed)
[facility] is requesting prior authorization Key: [id number] Name: Kaneshiro Clocortolone Pivalate Pump 0.1% Cream has been rejected and requires PA

## 2022-11-09 NOTE — Telephone Encounter (Signed)
Start PA-Key: [id number]) - [id number] Clocortolone Pivalate Pump 0.1% cream Approval is awaiting

## 2022-11-10 ENCOUNTER — Other Ambulatory Visit: Payer: Self-pay | Admitting: Family Medicine

## 2022-11-10 LAB — COMPREHENSIVE METABOLIC PANEL
ALT: 13 IU/L (ref 0–32)
AST: 16 IU/L (ref 0–40)
Albumin/Globulin Ratio: 2.2 (ref 1.2–2.2)
Albumin: 4.6 g/dL (ref 3.9–4.9)
Alkaline Phosphatase: 93 IU/L (ref 44–121)
BUN/Creatinine Ratio: 18 (ref 12–28)
BUN: 23 mg/dL (ref 8–27)
Bilirubin Total: 0.5 mg/dL (ref 0.0–1.2)
CO2: 24 mmol/L (ref 20–29)
Calcium: 9.8 mg/dL (ref 8.7–10.3)
Chloride: 105 mmol/L (ref 96–106)
Creatinine, Ser: 1.29 mg/dL — ABNORMAL HIGH (ref 0.57–1.00)
Globulin, Total: 2.1 g/dL (ref 1.5–4.5)
Glucose: 107 mg/dL — ABNORMAL HIGH (ref 70–99)
Potassium: 4.4 mmol/L (ref 3.5–5.2)
Sodium: 146 mmol/L — ABNORMAL HIGH (ref 134–144)
Total Protein: 6.7 g/dL (ref 6.0–8.5)
eGFR: 45 mL/min/{1.73_m2} — ABNORMAL LOW (ref >59)

## 2022-11-10 LAB — TSH+FREE T4
Free T4: 1.46 ng/dL (ref 0.82–1.77)
TSH: 2.38 u[IU]/mL (ref 0.450–4.500)

## 2022-11-10 LAB — LIPID PANEL
Chol/HDL Ratio: 3 ratio (ref 0.0–4.4)
Cholesterol, Total: 192 mg/dL (ref 100–199)
HDL: 65 mg/dL (ref >39)
LDL Chol Calc (NIH): 111 mg/dL — ABNORMAL HIGH (ref 0–99)
Triglycerides: 87 mg/dL (ref 0–149)
VLDL Cholesterol Cal: 16 mg/dL (ref 5–40)

## 2022-11-10 NOTE — Progress Notes (Signed)
Labs relatively unchanged. Kidney dysfunction remains borderline CKD 3a/3b. The 10-year ASCVD risk score (Arnett DK, et al., 2019) is: 18.4%. Continue recommendation for Statin based medication.  Stroke and heart attack risk remains elevated at 19% in 10 years. I continue to recommend diet low in saturated fat and regular exercise - 30 min at least 5 times per week. We will continue same thyroid dose for 1 year. Reach back out if needed.

## 2022-11-11 ENCOUNTER — Telehealth: Payer: Self-pay | Admitting: Family Medicine

## 2022-11-11 ENCOUNTER — Other Ambulatory Visit: Payer: Self-pay | Admitting: Family Medicine

## 2022-11-11 DIAGNOSIS — H01116 Allergic dermatitis of left eye, unspecified eyelid: Secondary | ICD-10-CM

## 2022-11-11 NOTE — Telephone Encounter (Signed)
[facility] advised of approved PA for clocortolone pivalate 0.1% cream. Co-pay $200. Patient request a different option with reasonable co-pay.  Please advise.

## 2022-11-11 NOTE — Telephone Encounter (Signed)
Medication Refill - Medication:  Clocortolone Pivalate (CLODERM) 0.1 % cream [[id number]]   Patient is wanting to change the pharmacy below Has the patient contacted their pharmacy? Yes.   (Agent: If no, request that the patient contact the pharmacy for the refill. If patient does not wish to contact the pharmacy document the reason why and proceed with request.) (Agent: If yes, when and what did the pharmacy advise?)  Preferred Pharmacy (with phone number or street name): [id number] - [place], [place] - [address] [[id number]]  Has the patient been seen for an appointment in the last year OR does the patient have an upcoming appointment? Yes.    Agent: Please be advised that RX refills may take up to 3 business days. We ask that you follow-up with your pharmacy.

## 2022-11-11 NOTE — Telephone Encounter (Signed)
Requested Prescriptions  Refused Prescriptions Disp Refills   Clocortolone Pivalate (CLODERM) 0.1 % cream 75 g 0    Sig: Apply 1 Application topically 2 (two) times daily.     Off-Protocol Failed - 11/11/2022  4:59 PM      Failed - Medication not assigned to a protocol, review manually.      Passed - Valid encounter within last 12 months    Recent Outpatient Visits           2 days ago Prediabetes   [facility] Merita Norton T, FNP   2 months ago Stage 3a chronic kidney disease [facility])   [facility] Jacky Kindle, FNP   11 months ago Annual physical exam   [facility] Jacky Kindle, FNP   1 year ago Elevated serum creatinine   [facility] Merita Norton T, FNP   2 years ago Mild intermittent asthma without complication   [facility] Joycelyn Man M, [place]

## 2022-11-11 NOTE — Telephone Encounter (Signed)
-----   Message from Jacky Kindle, FNP sent at 11/10/2022  8:35 AM EDT ----- PA for medication was approved.  ----- Message ----- From: Unknown F[place] Sent: 11/10/2022   8:28 AM EDT To: Jacky Kindle, FNP  Please review incoming fax

## 2022-11-11 NOTE — Telephone Encounter (Signed)
Pt is calling to request weight loss medication as discussed to be sent to  [id number] Nicholes Rough, [place] - [address] AT [facility] OF [address] Phone: [phone]  Fax: [phone]

## 2022-11-15 NOTE — Telephone Encounter (Signed)
Spoke to pt-given info to call [facility] for the rate

## 2022-11-15 NOTE — Telephone Encounter (Signed)
Spoke to pt --scheduled appt 11/18/22 w/ Suzie Portela, NP.

## 2022-11-18 ENCOUNTER — Telehealth (INDEPENDENT_AMBULATORY_CARE_PROVIDER_SITE_OTHER): Payer: PPO | Admitting: Family Medicine

## 2022-11-18 DIAGNOSIS — Z91199 Patient's noncompliance with other medical treatment and regimen due to unspecified reason: Secondary | ICD-10-CM

## 2022-11-18 NOTE — Progress Notes (Signed)
Pt noted she was unable to attend d/t conference. Appt was not cancelled.  Jacky Kindle, FNP  [facility] [address] [place], [place] [address] [phone] (phone) [phone] (fax) [facility]

## 2023-01-23 DIAGNOSIS — I10 Essential (primary) hypertension: Secondary | ICD-10-CM | POA: Diagnosis not present

## 2023-01-23 DIAGNOSIS — R600 Localized edema: Secondary | ICD-10-CM | POA: Diagnosis not present

## 2023-01-23 DIAGNOSIS — R7303 Prediabetes: Secondary | ICD-10-CM | POA: Diagnosis not present

## 2023-01-23 DIAGNOSIS — N1831 Chronic kidney disease, stage 3a: Secondary | ICD-10-CM | POA: Diagnosis not present

## 2023-03-14 DIAGNOSIS — H2513 Age-related nuclear cataract, bilateral: Secondary | ICD-10-CM | POA: Diagnosis not present

## 2023-03-21 ENCOUNTER — Ambulatory Visit: Payer: PPO

## 2023-03-21 VITALS — Ht 68.0 in | Wt 240.0 lb

## 2023-03-21 DIAGNOSIS — Z0000 Encounter for general adult medical examination without abnormal findings: Secondary | ICD-10-CM | POA: Diagnosis not present

## 2023-03-21 DIAGNOSIS — Z1231 Encounter for screening mammogram for malignant neoplasm of breast: Secondary | ICD-10-CM

## 2023-03-21 NOTE — Patient Instructions (Signed)
[name] , Thank you for taking time to come for your Medicare Wellness Visit. I appreciate your ongoing commitment to your health goals. Please review the following plan we discussed and let me know if I can assist you in the future.   Referrals/Orders/Follow-Ups/Clinician Recommendations:   You have an order for:  []   2D Mammogram  [x]   3D Mammogram  []   Bone Density     Please call for appointment:  [facility]  [address] [place] [address] [phone]  [facility] [address] [place], [place] [address] [phone]  [facility] [address] Geanie Logan [place], [place] [address] [phone]    Make sure to wear two-piece clothing.  No lotions, powders, or deodorants the day of the appointment. Make sure to bring picture ID and insurance card.  Bring list of medications you are currently taking including any supplements.   Schedule your [place] screening mammogram through MyChart!   Log into your MyChart account.  Go to 'Visit' (or 'Appointments' if on mobile App) --> Schedule an Appointment  Under 'Select a Reason for Visit' choose the Mammogram Screening option.  Complete the pre-visit questions and select the time and place that best fits your schedule.    This is a list of the screening recommended for you and due dates:  Health Maintenance  Topic Date Due   Zoster (Shingles) Vaccine (1 of 2) 06/29/2003   Pneumonia Vaccine (3 of 3 - PPSV23 or PCV20) 06/23/2020   Mammogram  08/24/2022   Flu Shot  01/05/2023   COVID-19 Vaccine (3 - 2023-24 season) 02/05/2023   Medicare Annual Wellness Visit  03/20/2024   Cologuard (Stool DNA test)  01/18/2025   DTaP/Tdap/Td vaccine (3 - Td or Tdap) 10/07/2028   DEXA scan (bone density measurement)  Completed   Hepatitis C Screening  Completed   HPV Vaccine  Aged Out   Colon Cancer  Screening  Discontinued    Advanced directives: (Declined) Advance directive discussed with you today. Even though you declined this today, please call our office should you change your mind, and we can give you the proper paperwork for you to fill out. Pt says she has the paperwork and will complete soon.  Next Medicare Annual Wellness Visit scheduled for next year: Yes  03/25/24 @ 1:40pm telephone

## 2023-03-21 NOTE — Progress Notes (Signed)
Subjective:   [name] is a 67 y.o. female who presents for Medicare Annual (Subsequent) preventive examination.  Visit Complete: Virtual I connected with  [name] on 03/21/23 by a audio enabled telemedicine application and verified that I am speaking with the correct person using two identifiers.  Patient Location: Home  Provider Location: Office/Clinic  I discussed the limitations of evaluation and management by telemedicine. The patient expressed understanding and agreed to proceed.  Vital Signs: Because this visit was a virtual/telehealth visit, some criteria may be missing or patient reported. Any vitals not documented were not able to be obtained and vitals that have been documented are patient reported.  Patient Medicare AWV questionnaire was completed by the patient on 03/21/23; I have confirmed that all information answered by patient is correct and no changes since this date. Cardiac Risk Factors include: advanced age (>34men, >33 women);obesity (BMI >30kg/m2)    Objective:    Today's Vitals   03/21/23 1338  Weight: 240 lb (108.9 kg)  Height: 5\' 8"  (1.727 m)   Body mass index is 36.49 kg/m.     03/21/2023    1:48 PM 05/16/2022    9:05 AM 05/12/2021    9:48 AM 09/16/2019    9:49 AM  Advanced Directives  Does Patient Have a Medical Advance Directive? No No No No  Would patient like information on creating a medical advance directive?  No - Patient declined No - Patient declined No - Patient declined    Current Medications (verified) Outpatient Encounter Medications as of 03/21/2023  Medication Sig   albuterol (VENTOLIN HFA) 108 (90 Base) MCG/ACT inhaler Inhale 2 puffs into the lungs every 6 (six) hours as needed.   allopurinol (ZYLOPRIM) 100 MG tablet Take 1 tablet (100 mg total) by mouth daily.   clobetasol (TEMOVATE) 0.05 % external solution Apply 1 Application topically as needed.   Clocortolone Pivalate (CLODERM) 0.1 % cream Apply 1 Application  topically 2 (two) times daily.   dapagliflozin propanediol (FARXIGA) 10 MG TABS tablet Take 1 tablet (10 mg total) by mouth daily before breakfast.   fluticasone (FLONASE) 50 MCG/ACT nasal spray USE 2 SPRAYS INTO EACH NOSTRIL ONCE DAILY AS DIRECTED.   furosemide (LASIX) 40 MG tablet Take 1 tablet (40 mg total) by mouth daily.   ibuprofen (IBU) 600 MG tablet TAKE 1 TABLET BY MOUTH EVERY 8 HOURS AS NEEDED.   levothyroxine (SYNTHROID) 75 MCG tablet Take 1 tablet (75 mcg total) by mouth daily.   losartan (COZAAR) 50 MG tablet Take 1 tablet (50 mg total) by mouth daily.   metFORMIN (GLUCOPHAGE) 500 MG tablet Take by mouth.   montelukast (SINGULAIR) 10 MG tablet Take 1 tablet (10 mg total) by mouth at bedtime.   NON FORMULARY CPAP nightly.   olopatadine (PATANOL) 0.1 % ophthalmic solution Place 1 drop into both eyes 2 (two) times daily.   potassium chloride (KLOR-CON M) 10 MEQ tablet Take 2 tablets (20 mEq total) by mouth 2 (two) times daily.   Vitamin D, Ergocalciferol, (DRISDOL) 1.25 MG (50000 UNIT) CAPS capsule Take 1 capsule (50,000 Units total) by mouth once a week.   No facility-administered encounter medications on file as of 03/21/2023.    Allergies (verified) Penicillins   History: Past Medical History:  Diagnosis Date   Allergy 1975   Penicillin   Broken arm    Cervical cancer (HCC)    Chronic kidney disease, stage III (moderate) (HCC)    Foot fracture 2013   Hiatal  hernia    Hypopotassemia    OSA on CPAP    Other abnormal glucose    Pure hypercholesterolemia    Reflux esophagitis    Sleep apnea    Thyroid fullness    Thyroid nodule    Unspecified vitamin D deficiency    Past Surgical History:  Procedure Laterality Date   ABDOMINAL HYSTERECTOMY  2007   CHOLECYSTECTOMY     ELBOW FRACTURE SURGERY Right    FOOT SURGERY Left 03/06/2010   FRACTURE SURGERY  2010   Not sure of date. Broken elbow   GALLBLADDER SURGERY     GANGLION CYST EXCISION     TOTAL VAGINAL  HYSTERECTOMY     Family History  Problem Relation Age of Onset   Cancer Mother    Kidney disease Mother    Heart attack Father 32       MI   Heart disease Father    CAD Father    Early death Father    Breast cancer Maternal Grandmother    Hypertension Brother    Diabetes Brother    Social History   Socioeconomic History   Marital status: Married    Spouse name: Not on file   Number of children: 3   Years of education: Not on file   Highest education level: Associate degree: occupational, Scientist, product/process development, or vocational program  Occupational History    Comment: self employeed  Tobacco Use   Smoking status: Never   Smokeless tobacco: Never  Vaping Use   Vaping status: Never Used  Substance and Sexual Activity   Alcohol use: Yes    Alcohol/week: 0.0 - 1.0 standard drinks of alcohol    Comment: 1-2 some weeks   Drug use: No   Sexual activity: Not Currently    Birth control/protection: None  Other Topics Concern   Not on file  Social History Narrative   Not on file   Social Determinants of Health   Financial Resource Strain: Low Risk  (03/21/2023)   Overall Financial Resource Strain (CARDIA)    Difficulty of Paying Living Expenses: Not very hard  Food Insecurity: No Food Insecurity (03/21/2023)   Hunger Vital Sign    Worried About Running Out of Food in the Last Year: Never true    Ran Out of Food in the Last Year: Never true  Transportation Needs: No Transportation Needs (03/21/2023)   PRAPARE - Administrator, Civil Service (Medical): No    Lack of Transportation (Non-Medical): No  Physical Activity: Sufficiently Active (03/21/2023)   Exercise Vital Sign    Days of Exercise per Week: 3 days    Minutes of Exercise per Session: 60 min  Stress: No Stress Concern Present (03/21/2023)   [employer] of Occupational Health - Occupational Stress Questionnaire    Feeling of Stress : Not at all  Social Connections: Socially Integrated (03/21/2023)   Social  Connection and Isolation Panel [NHANES]    Frequency of Communication with Friends and Family: More than three times a week    Frequency of Social Gatherings with Friends and Family: Twice a week    Attends Religious Services: More than 4 times per year    Active Member of [employer] or Organizations: Yes    Attends Engineer, structural: More than 4 times per year    Marital Status: Married    Tobacco Counseling Counseling given: Not Answered   Clinical Intake:  Pre-visit preparation completed: Yes  Pain : No/denies pain  BMI - recorded: 36.49 Nutritional Status: BMI > 30  Obese Nutritional Risks: None Diabetes: No  How often do you need to have someone help you when you read instructions, pamphlets, or other written materials from your doctor or pharmacy?: 1 - Never  Interpreter Needed?: No  Comments: lives with husband Information entered by :: B.Elmus Mathes,LPN   Activities of Daily Living    03/21/2023    1:33 PM 11/09/2022   10:20 AM  In your present state of health, do you have any difficulty performing the following activities:  Hearing? 0 0  Vision? 0 0  Difficulty concentrating or making decisions? 0 0  Walking or climbing stairs? 0 0  Dressing or bathing? 0 0  Doing errands, shopping? 0 0  Preparing Food and eating ? N   Using the Toilet? N   In the past six months, have you accidently leaked urine? N   Do you have problems with loss of bowel control? N   Managing your Medications? N   Managing your Finances? N   Housekeeping or managing your Housekeeping? N     Patient Care Team: Jacky Kindle, FNP as PCP - General (Family Medicine) Mosetta Pigeon, MD (Nephrology) Deeann Saint, MD (Orthopedic Surgery) Bud Face, MD as Referring Physician (Otolaryngology) Jesusita Oka, MD (Dermatology)  Indicate any recent Medical Services you may have received from other than [facility] providers in the past year (date may be approximate).      Assessment:   This is a routine wellness examination for [name].  Hearing/Vision screen Hearing Screening - Comments:: Pt says her hearing is pretty good Vision Screening - Comments:: Pt says her vision is good;readers only [facility]   Goals Addressed             This Visit's Progress    COMPLETED: DIET - EAT MORE FRUITS AND VEGETABLES   On track    COMPLETED: DIET - EAT MORE FRUITS AND VEGETABLES   On track    COMPLETED: Reduce caffeine intake   On track    Recommend to continue to cut back on sweet tea intake and increase water intake .        Depression Screen    03/21/2023    1:45 PM 11/09/2022   10:20 AM 05/16/2022    9:03 AM 12/16/2021    9:32 AM 05/12/2021    9:46 AM 09/07/2020    9:34 AM 09/16/2019    9:46 AM  PHQ 2/9 Scores  PHQ - 2 Score 0 0 0 0 0 0 0  PHQ- 9 Score  0 0 1  0     Fall Risk    03/21/2023    1:33 PM 11/09/2022   10:20 AM 05/16/2022    9:05 AM 12/16/2021    9:32 AM 05/12/2021    9:49 AM  Fall Risk   Falls in the past year? 0 0 0 0 0  Number falls in past yr: 0 0 0 0 0  Injury with Fall? 0 0 0 0 0  Risk for fall due to : No Fall Risks No Fall Risks No Fall Risks  No Fall Risks  Follow up Falls prevention discussed;Education provided Falls evaluation completed Falls prevention discussed;Falls evaluation completed  Falls prevention discussed    MEDICARE RISK AT HOME: Medicare Risk at Home Any stairs in or around the home?: Yes If so, are there any without handrails?: No Home free of loose throw rugs in walkways, pet beds, electrical cords, etc?: Yes  Adequate lighting in your home to reduce risk of falls?: Yes Life alert?: No Use of a cane, walker or w/c?: No Grab bars in the bathroom?: No Shower chair or bench in shower?: Yes Elevated toilet seat or a handicapped toilet?: Yes  TIMED UP AND GO:  Was the test performed?  No    Cognitive Function:        03/21/2023    1:50 PM 05/16/2022    9:07 AM  6CIT Screen  What  Year? 0 points 0 points  What month? 0 points 0 points  What time? 0 points 0 points  Count back from 20 0 points 0 points  Months in reverse 0 points 0 points  Repeat phrase 0 points 0 points  Total Score 0 points 0 points    Immunizations Immunization History  Administered Date(s) Administered   Influenza Split 04/19/2006, 07/17/2009   Influenza, High Dose Seasonal PF 10/08/2018   Influenza,inj,Quad PF,6+ Mos 02/13/2013, 06/24/2015   Influenza-Unspecified 04/06/2018, 10/08/2018   PFIZER(Purple Top)SARS-COV-2 Vaccination 07/29/2019, 08/20/2019   Pneumococcal Conjugate-13 06/24/2015   Pneumococcal Polysaccharide-23 02/13/2013   Tdap 03/27/2008, 10/08/2018   Zoster, Live 08/03/2012    TDAP status: Up to date  Flu Vaccine status: Declined, Education has been provided regarding the importance of this vaccine but patient still declined. Advised may receive this vaccine at local pharmacy or Health Dept. Aware to provide a copy of the vaccination record if obtained from local pharmacy or Health Dept. Verbalized acceptance and understanding.  Pneumococcal vaccine status: Up to date  Covid-19 vaccine status: Completed vaccines  Qualifies for Shingles Vaccine? Yes   Zostavax completed No   Shingrix Completed?: No.    Education has been provided regarding the importance of this vaccine. Patient has been advised to call insurance company to determine out of pocket expense if they have not yet received this vaccine. Advised may also receive vaccine at local pharmacy or Health Dept. Verbalized acceptance and understanding.  Screening Tests Health Maintenance  Topic Date Due   Zoster Vaccines- Shingrix (1 of 2) 06/29/2003   Pneumonia Vaccine 67+ Years old (3 of 3 - PPSV23 or PCV20) 06/23/2020   MAMMOGRAM  08/24/2022   INFLUENZA VACCINE  01/05/2023   COVID-19 Vaccine (3 - 2023-24 season) 02/05/2023   Medicare Annual Wellness (AWV)  03/20/2024   Fecal DNA (Cologuard)  01/18/2025    DTaP/Tdap/Td (3 - Td or Tdap) 10/07/2028   DEXA SCAN  Completed   Hepatitis C Screening  Completed   HPV VACCINES  Aged Out   Colonoscopy  Discontinued    Health Maintenance  Health Maintenance Due  Topic Date Due   Zoster Vaccines- Shingrix (1 of 2) 06/29/2003   Pneumonia Vaccine 72+ Years old (3 of 3 - PPSV23 or PCV20) 06/23/2020   MAMMOGRAM  08/24/2022   INFLUENZA VACCINE  01/05/2023   COVID-19 Vaccine (3 - 2023-24 season) 02/05/2023    Colorectal cancer screening: Type of screening: Cologuard. Completed 01/18/22. Repeat every 3 years  Mammogram status: Ordered yes. Pt provided with contact info and advised to call to schedule appt.   Bone Density status: Completed 08/23/21. Results reflect: Bone density results: NORMAL. Repeat every 5 years.  Lung Cancer Screening: (Low Dose CT Chest recommended if Age 69-80 years, 20 pack-year currently smoking OR have quit w/in 15years.) does not qualify.   Lung Cancer Screening Referral: no  Additional Screening:  Hepatitis C Screening: does not qualify; Completed 04/25/22  Vision Screening: Recommended annual ophthalmology exams for early detection  of glaucoma and other disorders of the eye. Is the patient up to date with their annual eye exam?  Yes  Who is the provider or what is the name of the office in which the patient attends annual eye exams? Dr Dellie Burns If pt is not established with a provider, would they like to be referred to a provider to establish care? No .   Dental Screening: Recommended annual dental exams for proper oral hygiene  Diabetic Foot Exam: n/a  Community Resource Referral / Chronic Care Management: CRR required this visit?  No   CCM required this visit?  No    Plan:     I have personally reviewed and noted the following in the patient's chart:   Medical and social history Use of alcohol, tobacco or illicit drugs  Current medications and supplements including opioid prescriptions. Patient is not  currently taking opioid prescriptions. Functional ability and status Nutritional status Physical activity Advanced directives List of other physicians Hospitalizations, surgeries, and ER visits in previous 12 months Vitals Screenings to include cognitive, depression, and falls Referrals and appointments  In addition, I have reviewed and discussed with patient certain preventive protocols, quality metrics, and best practice recommendations. A written personalized care plan for preventive services as well as general preventive health recommendations were provided to patient.    Sue Lush, LPN   [id number]   After Visit Summary: (MyChart) Due to this being a telephonic visit, the after visit summary with patients personalized plan was offered to patient via MyChart   Nurse Notes: The patient states she is doing well and has no concerns or questions at this time.  Pt last CPE 07/2021. Pt declines to schedule CPE appt today: says she will call back when she has her schedule.  **MMG order placed

## 2023-05-10 ENCOUNTER — Encounter: Payer: Self-pay | Admitting: Family Medicine

## 2023-06-08 ENCOUNTER — Other Ambulatory Visit: Payer: Self-pay | Admitting: Family Medicine

## 2023-06-08 ENCOUNTER — Other Ambulatory Visit: Payer: Self-pay | Admitting: Cardiovascular Disease

## 2023-06-08 DIAGNOSIS — E559 Vitamin D deficiency, unspecified: Secondary | ICD-10-CM

## 2023-06-08 NOTE — Telephone Encounter (Signed)
**Note De-identified  [place] Obfuscation** Please advise 

## 2023-06-15 NOTE — Progress Notes (Signed)
 Cardiology Office Note  Date:  06/16/2023   ID:  Rogan, Ecklund [birth year], MRN [id number]  PCP:  Emilio Kelly DASEN, FNP   Chief Complaint  Patient presents with   12 month follow up     Doing well.     HPI:  [name] is a 68 year old woman with past medical history of chest pain, shortness of breath, abnormal stress test,  obesity  Seen previously by cardiology in 2014 Diabetes type 2 Who presents abnormal EKG, hyperlipidemia  LOV 1/23  Works at home Drinks lots of [employer] Lasix  daily Trace ankle swelling Sits at a computer at home when working  Active at [employer] with grandchildren, lots of sports activities Recently walked 2 miles for exercise Denies chest pain concerning for angina, no shortness of breath  EKG personally reviewed by myself on todays visit EKG Interpretation Date/Time:  Friday June 16 2023 10:25:29 EST Ventricular Rate:  61 PR Interval:  206 QRS Duration:  94 QT Interval:  438 QTC Calculation: 440 R Axis:   11  Text Interpretation: Normal sinus rhythm Incomplete right bundle branch block When compared with ECG of 26-Mar-2010 11:43, No significant change was found Confirmed by Perla Lye [phone]) on 06/16/2023 10:28:28 AM   Lab work reviewed A1c 6.0 Total cholesterol 192 LDL 111  Cardiac CTA 2014 with no coronary disease, no coronary calcification  Other past medical history reviewed Prior cardiac CT scan November 2008 showing coronary calcium score of 0, no significant coronary artery disease, normal LV and RV size and systolic function  Prior echocardiogram September 2011 was essentially normal with ejection fraction greater than 55%, normal right ventricular systolic pressure   PMH:   has a past medical history of Allergy (1975), Broken arm, Cervical cancer (HCC), Chronic kidney disease, stage III (moderate) (HCC), Foot fracture (2013), Hiatal hernia, Hypopotassemia, OSA on CPAP, Other abnormal glucose, Pure  hypercholesterolemia, Reflux esophagitis, Sleep apnea, Thyroid  fullness, Thyroid  nodule, and Unspecified vitamin D  deficiency.  PSH:    Past Surgical History:  Procedure Laterality Date   ABDOMINAL HYSTERECTOMY  2007   CHOLECYSTECTOMY     ELBOW FRACTURE SURGERY Right    FOOT SURGERY Left 03/06/2010   FRACTURE SURGERY  2010   Not sure of date. Broken elbow   GALLBLADDER SURGERY     GANGLION CYST EXCISION     TOTAL VAGINAL HYSTERECTOMY      Current Outpatient Medications  Medication Sig Dispense Refill   albuterol  (VENTOLIN  HFA) 108 (90 Base) MCG/ACT inhaler Inhale 2 puffs into the lungs every 6 (six) hours as needed. 18 g 5   allopurinol  (ZYLOPRIM ) 100 MG tablet Take 1 tablet (100 mg total) by mouth daily. 90 tablet 3   clobetasol  (TEMOVATE ) 0.05 % external solution Apply 1 Application topically as needed. 25 mL 5   Clocortolone  Pivalate (CLODERM ) 0.1 % cream Apply 1 Application topically 2 (two) times daily. 75 g 0   dapagliflozin  propanediol (FARXIGA ) 10 MG TABS tablet Take 1 tablet (10 mg total) by mouth daily before breakfast. 90 tablet 3   fluticasone  (FLONASE ) 50 MCG/ACT nasal spray USE 2 SPRAYS INTO EACH NOSTRIL ONCE DAILY AS DIRECTED. 16 g 12   furosemide  (LASIX ) 40 MG tablet Take 1 tablet (40 mg total) by mouth daily. 90 tablet 3   ibuprofen  (IBU) 600 MG tablet TAKE 1 TABLET BY MOUTH EVERY 8 HOURS AS NEEDED. 270 tablet 3   levothyroxine  (SYNTHROID ) 75 MCG tablet Take 1 tablet (75 mcg total) by mouth  daily. 90 tablet 3   losartan  (COZAAR ) 50 MG tablet TAKE 1 TABLET BY MOUTH DAILY. 90 tablet 1   metFORMIN (GLUCOPHAGE) 500 MG tablet Take 500 mg by mouth daily with breakfast.     montelukast  (SINGULAIR ) 10 MG tablet Take 1 tablet (10 mg total) by mouth at bedtime. 90 tablet 3   NON FORMULARY CPAP nightly.     olopatadine  (PATANOL) 0.1 % ophthalmic solution Place 1 drop into both eyes 2 (two) times daily. 5 mL 12   potassium chloride  (KLOR-CON  M) 10 MEQ tablet Take 20 mEq by mouth  daily.     Vitamin D , Ergocalciferol , 50000 units CAPS Take 1 each by mouth once a week. 13 capsule 0   No current facility-administered medications for this visit.   Allergies:   Penicillins   Social History:  The patient  reports that she has never smoked. She has never used smokeless tobacco. She reports current alcohol use. She reports that she does not use drugs.   Family History:   family history includes Breast cancer in her maternal grandmother; CAD in her father; Cancer in her mother; Diabetes in her brother; Early death in her father; Heart attack (age of onset: 78) in her father; Heart disease in her father; Hypertension in her brother; Kidney disease in her mother.   Review of Systems: Review of Systems  Constitutional: Negative.   HENT: Negative.    Respiratory: Negative.    Cardiovascular: Negative.   Gastrointestinal: Negative.   Musculoskeletal: Negative.   Neurological: Negative.   Psychiatric/Behavioral: Negative.    All other systems reviewed and are negative.   PHYSICAL EXAM: VS:  BP 118/60 (BP Location: Left Arm, Patient Position: Sitting, Cuff Size: Large)   Pulse 61   Ht 5' 8 (1.727 m)   Wt 238 lb 4 oz (108.1 kg)   SpO2 98%   BMI 36.23 kg/m  , BMI Body mass index is 36.23 kg/m. Constitutional:  oriented to person, place, and time. No distress.  HENT:  Head: Grossly normal Eyes:  no discharge. No scleral icterus.  Neck: No JVD, no carotid bruits  Cardiovascular: Regular rate and rhythm, no murmurs appreciated Pulmonary/Chest: Clear to auscultation bilaterally, no wheezes or rails Abdominal: Soft.  no distension.  no tenderness.  Musculoskeletal: Normal range of motion Neurological:  normal muscle tone. Coordination normal. No atrophy Skin: Skin warm and dry Psychiatric: normal affect, pleasant  Recent Labs: 08/15/2022: Hemoglobin 13.2; Platelets 200 11/09/2022: ALT 13; BUN 23; Creatinine, Ser 1.29; Potassium 4.4; Sodium 146; TSH 2.380    Lipid  Panel Lab Results  Component Value Date   CHOL 192 11/09/2022   HDL 65 11/09/2022   LDLCALC 111 (H) 11/09/2022   TRIG 87 11/09/2022      Wt Readings from Last 3 Encounters:  06/16/23 238 lb 4 oz (108.1 kg)  03/21/23 240 lb (108.9 kg)  11/09/22 240 lb (108.9 kg)    ASSESSMENT AND PLAN:  Problem List Items Addressed This Visit   None Visit Diagnoses       Type 2 diabetes mellitus without complication, with long-term current use of insulin (HCC)    -  Primary     Stage 3a chronic kidney disease (HCC)         Essential hypertension       Relevant Orders   EKG 12-Lead (Completed)     Mixed hyperlipidemia           Essential hypertension Blood pressure is well controlled on today's  visit. No changes made to the medications.  Near syncope/vertigo No recent near-syncope or vertigo symptoms  Hyperlipidemia In the past preferred not to be on statin Remote cardiac CTA no significant disease  We have ordered updated calcium scoring for risk stratification  Diabetes type 2 without complications We have encouraged continued exercise, careful diet management  A1c 6.0   Signed, Velinda Lunger, M.D., Ph.D. [facility] [place], [place] [phone]

## 2023-06-16 ENCOUNTER — Encounter: Payer: Self-pay | Admitting: Cardiovascular Disease

## 2023-06-16 ENCOUNTER — Ambulatory Visit: Payer: PPO | Attending: Cardiovascular Disease | Admitting: Cardiovascular Disease

## 2023-06-16 VITALS — BP 118/60 | HR 61 | Ht 68.0 in | Wt 238.2 lb

## 2023-06-16 DIAGNOSIS — Z794 Long term (current) use of insulin: Secondary | ICD-10-CM

## 2023-06-16 DIAGNOSIS — E119 Type 2 diabetes mellitus without complications: Secondary | ICD-10-CM | POA: Diagnosis not present

## 2023-06-16 DIAGNOSIS — E782 Mixed hyperlipidemia: Secondary | ICD-10-CM

## 2023-06-16 DIAGNOSIS — I10 Essential (primary) hypertension: Secondary | ICD-10-CM

## 2023-06-16 DIAGNOSIS — N1831 Chronic kidney disease, stage 3a: Secondary | ICD-10-CM | POA: Diagnosis not present

## 2023-06-16 NOTE — Patient Instructions (Addendum)
 Medication Instructions:  No changes  If you need a refill on your cardiac medications before your next appointment, please call your pharmacy.   Lab work: No new labs needed  Testing/Procedures: CT coronary calcium  score.   - $99 out of pocket cost at the time of your test - Call [phone] to schedule at your convenience.  [facility] [address] [place], [place] [address]   Coronary CalciumScan A coronary calcium  scan is an imaging test used to look for deposits of calcium  and other fatty materials (plaques) in the inner lining of the blood vessels of the heart (coronary arteries). These deposits of calcium  and plaques can partly clog and narrow the coronary arteries without producing any symptoms or warning signs. This puts a person at risk for a heart attack. This test can detect these deposits before symptoms develop. Tell a health care provider about: Any allergies you have. All medicines you are taking, including vitamins, herbs, eye drops, creams, and over-the-counter medicines. Any problems you or family members have had with anesthetic medicines. Any blood disorders you have. Any surgeries you have had. Any medical conditions you have. Whether you are pregnant or may be pregnant. What are the risks? Generally, this is a safe procedure. However, problems may occur, including: Harm to a pregnant woman and her unborn baby. This test involves the use of radiation. Radiation exposure can be dangerous to a pregnant woman and her unborn baby. If you are pregnant, you generally should not have this procedure done. Slight increase in the risk of cancer. This is because of the radiation involved in the test. What happens before the procedure? No preparation is needed for this procedure. What happens during the procedure? You will undress and remove any jewelry around your neck or chest. You will put on a hospital gown. Sticky  electrodes will be placed on your chest. The electrodes will be connected to an electrocardiogram (ECG) machine to record a tracing of the electrical activity of your heart. A CT scanner will take pictures of your heart. During this time, you will be asked to lie still and hold your breath for 2-3 seconds while a picture of your heart is being taken. The procedure may vary among health care providers and hospitals. What happens after the procedure? You can get dressed. You can return to your normal activities. It is up to you to get the results of your test. Ask your health care provider, or the department that is doing the test, when your results will be ready. Summary A coronary calcium  scan is an imaging test used to look for deposits of calcium  and other fatty materials (plaques) in the inner lining of the blood vessels of the heart (coronary arteries). Generally, this is a safe procedure. Tell your health care provider if you are pregnant or may be pregnant. No preparation is needed for this procedure. A CT scanner will take pictures of your heart. You can return to your normal activities after the scan is done. This information is not intended to replace advice given to you by your health care provider. Make sure you discuss any questions you have with your health care provider. Document Released: 11/19/2007 Document Revised: 04/11/2016 Document Reviewed: 04/11/2016 Elsevier Interactive Patient Education  2017 [employer].   Follow-Up: At [facility], you and your health needs are our priority.  As part of our continuing mission to provide you with exceptional heart care, we have created designated Provider  Care Teams.  These Care Teams include your primary Cardiologist (physician) and Advanced Practice Providers (APPs -  Physician Assistants and Nurse Practitioners) who all work together to provide you with the care you need, when you need it.  You will need a follow up appointment in  12 months  Providers on your designated Care Team:   Lonni Meager, NP Bernardino Bring, PA-C Cadence [place], [place]  COVID-19 Vaccine Information can be found at: PodExchange.nl For questions related to vaccine distribution or appointments, please email vaccine@[email or web address] or call [phone].

## 2023-06-27 ENCOUNTER — Telehealth: Payer: Self-pay | Admitting: Physician Assistant

## 2023-06-27 NOTE — Telephone Encounter (Signed)
[facility] is requesting refill olopatadine (PATANOL) 0.1 % ophthalmic solution   Please advise

## 2023-07-06 DIAGNOSIS — L02212 Cutaneous abscess of back [any part, except buttock]: Secondary | ICD-10-CM | POA: Diagnosis not present

## 2023-07-06 DIAGNOSIS — L570 Actinic keratosis: Secondary | ICD-10-CM | POA: Diagnosis not present

## 2023-07-14 NOTE — Telephone Encounter (Signed)
 Received another refill request for this medication from [facility].  Please advise.

## 2023-07-14 NOTE — Telephone Encounter (Signed)
 Please, check with pt that her pharmacy requested a refill for olopatadine  eye drops. It was prescribed by [name] in 2022. If she has problems with eyes, I will be glad to have an appointment with her.

## 2023-07-18 NOTE — Telephone Encounter (Signed)
Patient reports she has been using as needed since last prescribed. She has requested a refill because her current supply actually expired in 05/2023. She has been outdoors and her eyes get irritated sometimes due to this.   Do you want to refill or require an office visit?

## 2023-07-24 ENCOUNTER — Other Ambulatory Visit: Payer: Self-pay | Admitting: Physician Assistant

## 2023-07-24 DIAGNOSIS — J301 Allergic rhinitis due to pollen: Secondary | ICD-10-CM

## 2023-07-24 MED ORDER — OLOPATADINE HCL 0.1 % OP SOLN: 1.0000 [drp] | Freq: Two times a day (BID) | OPHTHALMIC | 0 refills | Status: DC

## 2023-08-10 DIAGNOSIS — I10 Essential (primary) hypertension: Secondary | ICD-10-CM | POA: Diagnosis not present

## 2023-08-10 DIAGNOSIS — I129 Hypertensive chronic kidney disease with stage 1 through stage 4 chronic kidney disease, or unspecified chronic kidney disease: Secondary | ICD-10-CM | POA: Diagnosis not present

## 2023-08-10 DIAGNOSIS — N1831 Chronic kidney disease, stage 3a: Secondary | ICD-10-CM | POA: Diagnosis not present

## 2023-08-10 DIAGNOSIS — R7303 Prediabetes: Secondary | ICD-10-CM | POA: Diagnosis not present

## 2023-08-10 DIAGNOSIS — R600 Localized edema: Secondary | ICD-10-CM | POA: Diagnosis not present

## 2023-08-10 LAB — COMPREHENSIVE METABOLIC PANEL WITH GFR
Albumin: 4.4 (ref 3.5–5.0)
Calcium: 9.4 (ref 8.7–10.7)
eGFR: 49

## 2023-08-10 LAB — PROTEIN / CREATININE RATIO, URINE: Creatinine, Urine: 128

## 2023-08-10 LAB — BASIC METABOLIC PANEL WITH GFR
BUN: 20 (ref 4–21)
CO2: 29 — AB (ref 13–22)
Chloride: 105 (ref 99–108)
Creatinine: 1.2 — AB (ref 0.5–1.1)
Glucose: 97
Potassium: 4.2 meq/L (ref 3.5–5.1)
Sodium: 141 (ref 137–147)

## 2023-08-10 LAB — CBC AND DIFFERENTIAL
HCT: 38 (ref 36–46)
Hemoglobin: 12 (ref 12.0–16.0)
Platelets: 195 10*3/uL (ref 150–400)
WBC: 5.9

## 2023-08-10 LAB — HEMOGLOBIN A1C: Hemoglobin A1C: 6.3

## 2023-08-10 LAB — CBC: RBC: 4.31 (ref 3.87–5.11)

## 2023-08-10 LAB — MICROALBUMIN, URINE: Microalb, Ur: <0.2

## 2023-09-11 ENCOUNTER — Telehealth: Payer: Self-pay | Admitting: Physician Assistant

## 2023-09-11 DIAGNOSIS — J4520 Mild intermittent asthma, uncomplicated: Secondary | ICD-10-CM

## 2023-09-11 DIAGNOSIS — J301 Allergic rhinitis due to pollen: Secondary | ICD-10-CM

## 2023-09-11 MED ORDER — MONTELUKAST SODIUM 10 MG PO TABS: 10.0000 mg | ORAL_TABLET | Freq: Every day | ORAL | 3 refills | Status: DC

## 2023-09-11 NOTE — Telephone Encounter (Signed)
 [facility] faxed refill request for the following medications:    montelukast (SINGULAIR) 10 MG tablet   Please advise

## 2023-10-04 DIAGNOSIS — M2012 Hallux valgus (acquired), left foot: Secondary | ICD-10-CM | POA: Diagnosis not present

## 2023-10-04 DIAGNOSIS — M79675 Pain in left toe(s): Secondary | ICD-10-CM | POA: Diagnosis not present

## 2023-10-04 DIAGNOSIS — M2042 Other hammer toe(s) (acquired), left foot: Secondary | ICD-10-CM | POA: Diagnosis not present

## 2023-10-17 ENCOUNTER — Other Ambulatory Visit: Payer: Self-pay | Admitting: Physician Assistant

## 2023-10-17 ENCOUNTER — Encounter: Payer: Self-pay | Admitting: Physician Assistant

## 2023-10-17 DIAGNOSIS — M1A09X0 Idiopathic chronic gout, multiple sites, without tophus (tophi): Secondary | ICD-10-CM

## 2023-10-17 DIAGNOSIS — E034 Atrophy of thyroid (acquired): Secondary | ICD-10-CM

## 2023-10-17 NOTE — Telephone Encounter (Signed)
 [facility] faxed refill request for the following medications:   Vitamin D , Ergocalciferol , 50000 units CAPS     Please advise.

## 2023-10-23 ENCOUNTER — Telehealth: Payer: Self-pay | Admitting: Physician Assistant

## 2023-10-23 ENCOUNTER — Other Ambulatory Visit: Payer: Self-pay | Admitting: Physician Assistant

## 2023-10-23 ENCOUNTER — Other Ambulatory Visit: Payer: Self-pay

## 2023-10-23 DIAGNOSIS — J4520 Mild intermittent asthma, uncomplicated: Secondary | ICD-10-CM

## 2023-10-23 DIAGNOSIS — J301 Allergic rhinitis due to pollen: Secondary | ICD-10-CM

## 2023-10-23 DIAGNOSIS — M1A09X0 Idiopathic chronic gout, multiple sites, without tophus (tophi): Secondary | ICD-10-CM

## 2023-10-23 NOTE — Telephone Encounter (Signed)
 Copied from CRM [id number]. Topic: Clinical - Medication Refill >> Oct 23, 2023  5:08 PM Santiya F wrote: Medication: albuterol  (VENTOLIN  HFA) 108 (90 Base) MCG/ACT inhaler [[id number]], fluticasone  (FLONASE ) 50 MCG/ACT nasal spray [[id number]]  Has the patient contacted their pharmacy? Yes  (Agent: If yes, when and what did the pharmacy advise?) contact office   This is the patient's preferred pharmacy:  [facility] - [place], [place] - [address] [place] [address] Phone: [phone] Fax: [phone]  Is this the correct pharmacy for this prescription? Yes If no, delete pharmacy and type the correct one.   Has the prescription been filled recently? Yes  Is the patient out of the medication? Yes  Has the patient been seen for an appointment in the last year OR does the patient have an upcoming appointment? Yes  Can we respond through MyChart? Yes  Agent: Please be advised that Rx refills may take up to 3 business days. We ask that you follow-up with your pharmacy.  Patient hasn't been seen in office since June 2024. Scheduled an appointment for June 25, 25. Patient wants to know if she can get a courtesy fill or enough medication to last until her appointment. Patient is going out of town 10/26/23, and needs the medication. Patient is requesting a call back.

## 2023-10-23 NOTE — Telephone Encounter (Signed)
 [facility] is requesting refill allopurinol  (ZYLOPRIM ) 100 MG tablet   Please advise

## 2023-10-23 NOTE — Telephone Encounter (Signed)
Converted to refill request 

## 2023-10-23 NOTE — Telephone Encounter (Signed)
 [facility] is requesting refill PATADAY  0.1 % eye soln ML & albuterol  (VENTOLIN  HFA) 108 (90 Base) MCG/ACT inhaler   & fluticasone  (FLONASE ) 50 MCG/ACT nasal spray  Please advise

## 2023-10-24 ENCOUNTER — Telehealth: Payer: Self-pay | Admitting: Physician Assistant

## 2023-10-24 DIAGNOSIS — J4520 Mild intermittent asthma, uncomplicated: Secondary | ICD-10-CM

## 2023-10-24 DIAGNOSIS — J301 Allergic rhinitis due to pollen: Secondary | ICD-10-CM

## 2023-10-24 NOTE — Telephone Encounter (Signed)
 Patient following up on this, as she is going out of town on Thursday. Please advise.  Copied from CRM [id number]. Topic: Clinical - Medication Question >> Oct 24, 2023  1:42 PM Tiffany S wrote: Reason for CRM: Patient is leaving out of town early Thursday morning patient wanted to know will the prescription process be complete before she leaves please follow up with patient  [facility] is requesting refill PATADAY  0.1 % eye soln ML & albuterol  (VENTOLIN  HFA) 108 (90 Base) MCG/ACT inhaler   & fluticasone  (FLONASE ) 50 MCG/ACT nasal spray

## 2023-10-25 MED ORDER — ALBUTEROL SULFATE HFA 108 (90 BASE) MCG/ACT IN AERS: 2.0000 | INHALATION_SPRAY | Freq: Four times a day (QID) | RESPIRATORY_TRACT | 5 refills | Status: AC | PRN

## 2023-10-25 MED ORDER — FLUTICASONE PROPIONATE 50 MCG/ACT NA SUSP
NASAL | 12 refills | Status: DC
Start: 2023-10-25 — End: 2023-11-28

## 2023-10-25 MED ORDER — OLOPATADINE HCL 0.1 % OP SOLN: 1.0000 [drp] | Freq: Two times a day (BID) | OPHTHALMIC | 0 refills | Status: DC

## 2023-10-25 NOTE — Addendum Note (Signed)
 Addended by: Estill Hemming on: 10/25/2023 08:33 AM   Modules accepted: Orders

## 2023-10-25 NOTE — Telephone Encounter (Signed)
 Duplicate request- refilled 10/25/23 Requested Prescriptions  Pending Prescriptions Disp Refills   albuterol  (VENTOLIN  HFA) 108 (90 Base) MCG/ACT inhaler 18 g 5    Sig: Inhale 2 puffs into the lungs every 6 (six) hours as needed.     Pulmonology:  Beta Agonists 2 Failed - 10/25/2023  1:38 PM      Failed - Valid encounter within last 12 months    Recent Outpatient Visits   None            Passed - Last BP in normal range    BP Readings from Last 1 Encounters:  06/16/23 118/60         Passed - Last Heart Rate in normal range    Pulse Readings from Last 1 Encounters:  06/16/23 61          fluticasone  (FLONASE ) 50 MCG/ACT nasal spray 16 g 12    Sig: USE 2 SPRAYS INTO EACH NOSTRIL ONCE DAILY AS DIRECTED.     Ear, Nose, and Throat: Nasal Preparations - Corticosteroids Failed - 10/25/2023  1:38 PM      Failed - Valid encounter within last 12 months    Recent Outpatient Visits   None

## 2023-10-25 NOTE — Addendum Note (Signed)
 Addended by: Estill Hemming on: 10/25/2023 08:36 AM   Modules accepted: Orders

## 2023-11-07 ENCOUNTER — Telehealth: Payer: Self-pay | Admitting: Physician Assistant

## 2023-11-07 NOTE — Telephone Encounter (Signed)
[facility] faxed refill request for the following medications:   albuterol (VENTOLIN HFA) 108 (90 Base) MCG/ACT inhaler     Please advise

## 2023-11-07 NOTE — Telephone Encounter (Signed)
 Medication has been refilled on 10/25/23

## 2023-11-09 MED ORDER — ALLOPURINOL 100 MG PO TABS: 100.0000 mg | ORAL_TABLET | Freq: Every day | ORAL | 0 refills | Status: DC

## 2023-11-09 NOTE — Telephone Encounter (Signed)
 A courtesy refill was sent. Advised to discuss/reassess the need for allopurinol  at the follow-up

## 2023-11-20 ENCOUNTER — Other Ambulatory Visit: Payer: Self-pay

## 2023-11-20 ENCOUNTER — Telehealth: Payer: Self-pay | Admitting: Physician Assistant

## 2023-11-20 DIAGNOSIS — E559 Vitamin D deficiency, unspecified: Secondary | ICD-10-CM

## 2023-11-20 NOTE — Telephone Encounter (Signed)
 [facility] is asking for refills of Vitamin D  1.25 mg (50,000 units) #13 with refills

## 2023-11-28 ENCOUNTER — Telehealth: Payer: Self-pay | Admitting: Physician Assistant

## 2023-11-28 DIAGNOSIS — J4520 Mild intermittent asthma, uncomplicated: Secondary | ICD-10-CM

## 2023-11-28 DIAGNOSIS — R601 Generalized edema: Secondary | ICD-10-CM

## 2023-11-28 DIAGNOSIS — E034 Atrophy of thyroid (acquired): Secondary | ICD-10-CM

## 2023-11-28 DIAGNOSIS — E559 Vitamin D deficiency, unspecified: Secondary | ICD-10-CM

## 2023-11-28 DIAGNOSIS — M1A09X0 Idiopathic chronic gout, multiple sites, without tophus (tophi): Secondary | ICD-10-CM

## 2023-11-28 DIAGNOSIS — J301 Allergic rhinitis due to pollen: Secondary | ICD-10-CM

## 2023-11-28 NOTE — Telephone Encounter (Unsigned)
 Copied from CRM [id number]. Topic: Clinical - Medication Refill >> Nov 28, 2023  8:21 AM Willma R wrote: Patient was scheduled on 06/25 to come in for a medication check up, had enough medication to last until her appointment, but had to change date due to provider. Now scheduled for 08/05 and needs enough medication to last until her appointment.  Medication:  Vitamin D , Ergocalciferol , 50000 units CAPS fluticasone  (FLONASE ) 50 MCG/ACT nasal spray potassium chloride  (KLOR-CON  M) 10 MEQ tablet losartan  (COZAAR ) 50 MG tablet furosemide  (LASIX ) 40 MG tablet allopurinol  (ZYLOPRIM ) 100 MG tablet montelukast  (SINGULAIR ) 10 MG tablet levothyroxine  (SYNTHROID ) 75 MCG tablet  Has the patient contacted their pharmacy? Yes, call dr  This is the patient's preferred pharmacy:  [facility] - [place], [place] - [address] [place] [address] Phone: [phone] Fax: [phone]  Is this the correct pharmacy for this prescription? Yes If no, delete pharmacy and type the correct one.   Has the prescription been filled recently? No  Is the patient out of the medication? No  Has the patient been seen for an appointment in the last year OR does the patient have an upcoming appointment? Yes  Can we respond through MyChart? Yes  Agent: Please be advised that Rx refills may take up to 3 business days. We ask that you follow-up with your pharmacy.

## 2023-11-29 ENCOUNTER — Ambulatory Visit: Admitting: Physician Assistant

## 2023-11-29 MED ORDER — LEVOTHYROXINE SODIUM 75 MCG PO TABS: 75.0000 ug | ORAL_TABLET | Freq: Every day | ORAL | 0 refills | Status: DC

## 2023-11-29 MED ORDER — VITAMIN D (ERGOCALCIFEROL) 50000 UNITS PO CAPS: 1.0000 | ORAL_CAPSULE | ORAL | 0 refills | Status: DC

## 2023-11-29 MED ORDER — FUROSEMIDE 40 MG PO TABS: 40.0000 mg | ORAL_TABLET | Freq: Every day | ORAL | 0 refills | Status: DC

## 2023-11-29 MED ORDER — ALLOPURINOL 100 MG PO TABS
100.0000 mg | ORAL_TABLET | Freq: Every day | ORAL | 0 refills | Status: DC
Start: 2023-11-29 — End: 2023-12-18

## 2023-11-29 MED ORDER — FLUTICASONE PROPIONATE 50 MCG/ACT NA SUSP: NASAL | 0 refills | Status: DC

## 2023-11-29 MED ORDER — POTASSIUM CHLORIDE CRYS ER 10 MEQ PO TBCR
20.0000 meq | EXTENDED_RELEASE_TABLET | Freq: Every day | ORAL | 2 refills | Status: DC
Start: 2023-11-29 — End: 2024-01-31

## 2023-11-29 MED ORDER — MONTELUKAST SODIUM 10 MG PO TABS: 10.0000 mg | ORAL_TABLET | Freq: Every day | ORAL | 0 refills | Status: DC

## 2023-11-29 MED ORDER — LOSARTAN POTASSIUM 50 MG PO TABS: 50.0000 mg | ORAL_TABLET | Freq: Every day | ORAL | 1 refills | Status: DC

## 2023-11-29 NOTE — Telephone Encounter (Signed)
 Requested medication (s) are due for refill today -yes  Requested medication (s) are on the active medication list -yes  Future visit scheduled -yes  Last refill: Losartan  06/08/23 #980 1RF- outside provider                  Potassium  12/10/21-historical/expired                  Vit D 06/08/23 #13- high dose medication- provider review                   Levothyroxine  10/17/23 #90- [facility] lab protocol- over 1 year                  Allopurinol  11/09/23 #30- has notes-courtesy refill  Notes to clinic: see above for review   Requested Prescriptions  Pending Prescriptions Disp Refills   allopurinol  (ZYLOPRIM ) 100 MG tablet 30 tablet 0    Sig: Take 1 tablet (100 mg total) by mouth daily.     Endocrinology:  Gout Agents - allopurinol  Failed - 11/29/2023  2:56 PM      Failed - Uric Acid in normal range and within 360 days    Uric Acid  Date Value Ref Range Status  09/16/2016 8.4 (H) 2.5 - 7.1 mg/dL Final    Comment:               Therapeutic target for gout patients: <6.0         Failed - Cr in normal range and within 360 days    Creatinine  Date Value Ref Range Status  08/10/2023 1.2 (A) 0.5 - 1.1 Final   Creatinine, Ser  Date Value Ref Range Status  11/09/2022 1.29 (H) 0.57 - 1.00 mg/dL Final   Creatinine, Urine  Date Value Ref Range Status  08/10/2023 128  Final         Failed - Valid encounter within last 12 months    Recent Outpatient Visits   None            Failed - CBC within normal limits and completed in the last 12 months    WBC  Date Value Ref Range Status  08/10/2023 5.9  Final   RBC  Date Value Ref Range Status  08/10/2023 4.31 3.87 - 5.11 Final   Hemoglobin  Date Value Ref Range Status  08/10/2023 12.0 12.0 - 16.0 Final  08/15/2022 13.2 11.1 - 15.9 g/dL Final   HCT  Date Value Ref Range Status  08/10/2023 38 36 - 46 Final   Hematocrit  Date Value Ref Range Status  08/15/2022 40.3 34.0 - 46.6 % Final   MCHC  Date Value Ref Range Status  08/15/2022  32.8 31.5 - 35.7 g/dL Final   [facility]  Date Value Ref Range Status  08/15/2022 28.3 26.6 - 33.0 pg Final   MCV  Date Value Ref Range Status  08/15/2022 87 79 - 97 fL Final   No results found for: PLTCOUNTKUC, LABPLAT, POCPLA RDW  Date Value Ref Range Status  08/15/2022 12.9 11.7 - 15.4 % Final          levothyroxine  (SYNTHROID ) 75 MCG tablet 90 tablet 0    Sig: Take 1 tablet (75 mcg total) by mouth daily.     Endocrinology:  Hypothyroid Agents Failed - 11/29/2023  2:56 PM      Failed - TSH in normal range and within 360 days    TSH  Date Value Ref Range Status  11/09/2022  2.380 0.450 - 4.500 uIU/mL Final         Failed - Valid encounter within last 12 months    Recent Outpatient Visits   None             losartan  (COZAAR ) 50 MG tablet 90 tablet 1    Sig: Take 1 tablet (50 mg total) by mouth daily.     Cardiovascular:  Angiotensin Receptor Blockers Failed - 11/29/2023  2:56 PM      Failed - Cr in normal range and within 180 days    Creatinine  Date Value Ref Range Status  08/10/2023 1.2 (A) 0.5 - 1.1 Final   Creatinine, Ser  Date Value Ref Range Status  11/09/2022 1.29 (H) 0.57 - 1.00 mg/dL Final   Creatinine, Urine  Date Value Ref Range Status  08/10/2023 128  Final         Failed - Valid encounter within last 6 months    Recent Outpatient Visits   None            Passed - K in normal range and within 180 days    Potassium  Date Value Ref Range Status  08/10/2023 4.2 3.5 - 5.1 mEq/L Final         Passed - Patient is not pregnant      Passed - Last BP in normal range    BP Readings from Last 1 Encounters:  06/16/23 118/60          potassium chloride  (KLOR-CON  M) 10 MEQ tablet 60 tablet 2    Sig: Take 2 tablets (20 mEq total) by mouth daily.     Endocrinology:  Minerals - Potassium Supplementation Failed - 11/29/2023  2:56 PM      Failed - Cr in normal range and within 360 days    Creatinine  Date Value Ref Range Status  08/10/2023  1.2 (A) 0.5 - 1.1 Final   Creatinine, Ser  Date Value Ref Range Status  11/09/2022 1.29 (H) 0.57 - 1.00 mg/dL Final   Creatinine, Urine  Date Value Ref Range Status  08/10/2023 128  Final         Failed - Valid encounter within last 12 months    Recent Outpatient Visits   None            Passed - K in normal range and within 360 days    Potassium  Date Value Ref Range Status  08/10/2023 4.2 3.5 - 5.1 mEq/L Final          Vitamin D , Ergocalciferol , 50000 units CAPS 13 capsule 0    Sig: Take 1 each by mouth once a week.     Endocrinology:  Vitamins - Vitamin D  Supplementation 2 Failed - 11/29/2023  2:56 PM      Failed - Manual Review: Route requests for 50,000 IU strength to the provider      Failed - Vitamin D  in normal range and within 360 days    Vit D, 25-Hydroxy  Date Value Ref Range Status  08/15/2022 48.6 30.0 - 100.0 ng/mL Final    Comment:    Vitamin D  deficiency has been defined by the Institute of Medicine and an Endocrine Society practice guideline as a level of serum 25-OH vitamin D  less than 20 ng/mL (1,2). The Endocrine Society went on to further define vitamin D  insufficiency as a level between 21 and 29 ng/mL (2). 1. IOM (Institute of Medicine). 2010. Dietary reference    intakes for  calcium and D. [place]: The    [employer]. 2. Holick MF, Binkley [place], Bischoff-Ferrari HA, et al.    Evaluation, treatment, and prevention of vitamin D     deficiency: an Endocrine Society clinical practice    guideline. JCEM. 2011 Jul; 96(7):1911-30.          Failed - Valid encounter within last 12 months    Recent Outpatient Visits   None            Passed - Ca in normal range and within 360 days    Calcium  Date Value Ref Range Status  08/10/2023 9.4 8.7 - 10.7 Final         Signed Prescriptions Disp Refills   furosemide  (LASIX ) 40 MG tablet 30 tablet 0    Sig: Take 1 tablet (40 mg total) by mouth daily.     Cardiovascular:   Diuretics - Loop Failed - 11/29/2023  2:56 PM      Failed - Cr in normal range and within 180 days    Creatinine  Date Value Ref Range Status  08/10/2023 1.2 (A) 0.5 - 1.1 Final   Creatinine, Ser  Date Value Ref Range Status  11/09/2022 1.29 (H) 0.57 - 1.00 mg/dL Final   Creatinine, Urine  Date Value Ref Range Status  08/10/2023 128  Final         Failed - Mg Level in normal range and within 180 days    No results found for: MG       Failed - Valid encounter within last 6 months    Recent Outpatient Visits   None            Passed - K in normal range and within 180 days    Potassium  Date Value Ref Range Status  08/10/2023 4.2 3.5 - 5.1 mEq/L Final         Passed - Ca in normal range and within 180 days    Calcium  Date Value Ref Range Status  08/10/2023 9.4 8.7 - 10.7 Final         Passed - Na in normal range and within 180 days    Sodium  Date Value Ref Range Status  08/10/2023 141 137 - 147 Final         Passed - Cl in normal range and within 180 days    Chloride  Date Value Ref Range Status  08/10/2023 105 99 - 108 Final         Passed - Last BP in normal range    BP Readings from Last 1 Encounters:  06/16/23 118/60          fluticasone  (FLONASE ) 50 MCG/ACT nasal spray 16 g 0    Sig: USE 2 SPRAYS INTO EACH NOSTRIL ONCE DAILY AS DIRECTED.     Ear, Nose, and Throat: Nasal Preparations - Corticosteroids Failed - 11/29/2023  2:56 PM      Failed - Valid encounter within last 12 months    Recent Outpatient Visits   None             montelukast  (SINGULAIR ) 10 MG tablet 30 tablet 0    Sig: Take 1 tablet (10 mg total) by mouth at bedtime.     Pulmonology:  Leukotriene Inhibitors Failed - 11/29/2023  2:56 PM      Failed - Valid encounter within last 12 months    Recent Outpatient Visits   None  Requested Prescriptions  Pending Prescriptions Disp Refills   allopurinol  (ZYLOPRIM ) 100 MG tablet 30 tablet 0    Sig: Take 1  tablet (100 mg total) by mouth daily.     Endocrinology:  Gout Agents - allopurinol  Failed - 11/29/2023  2:56 PM      Failed - Uric Acid in normal range and within 360 days    Uric Acid  Date Value Ref Range Status  09/16/2016 8.4 (H) 2.5 - 7.1 mg/dL Final    Comment:               Therapeutic target for gout patients: <6.0         Failed - Cr in normal range and within 360 days    Creatinine  Date Value Ref Range Status  08/10/2023 1.2 (A) 0.5 - 1.1 Final   Creatinine, Ser  Date Value Ref Range Status  11/09/2022 1.29 (H) 0.57 - 1.00 mg/dL Final   Creatinine, Urine  Date Value Ref Range Status  08/10/2023 128  Final         Failed - Valid encounter within last 12 months    Recent Outpatient Visits   None            Failed - CBC within normal limits and completed in the last 12 months    WBC  Date Value Ref Range Status  08/10/2023 5.9  Final   RBC  Date Value Ref Range Status  08/10/2023 4.31 3.87 - 5.11 Final   Hemoglobin  Date Value Ref Range Status  08/10/2023 12.0 12.0 - 16.0 Final  08/15/2022 13.2 11.1 - 15.9 g/dL Final   HCT  Date Value Ref Range Status  08/10/2023 38 36 - 46 Final   Hematocrit  Date Value Ref Range Status  08/15/2022 40.3 34.0 - 46.6 % Final   MCHC  Date Value Ref Range Status  08/15/2022 32.8 31.5 - 35.7 g/dL Final   [facility]  Date Value Ref Range Status  08/15/2022 28.3 26.6 - 33.0 pg Final   MCV  Date Value Ref Range Status  08/15/2022 87 79 - 97 fL Final   No results found for: PLTCOUNTKUC, LABPLAT, POCPLA RDW  Date Value Ref Range Status  08/15/2022 12.9 11.7 - 15.4 % Final          levothyroxine  (SYNTHROID ) 75 MCG tablet 90 tablet 0    Sig: Take 1 tablet (75 mcg total) by mouth daily.     Endocrinology:  Hypothyroid Agents Failed - 11/29/2023  2:56 PM      Failed - TSH in normal range and within 360 days    TSH  Date Value Ref Range Status  11/09/2022 2.380 0.450 - 4.500 uIU/mL Final         Failed -  Valid encounter within last 12 months    Recent Outpatient Visits   None             losartan  (COZAAR ) 50 MG tablet 90 tablet 1    Sig: Take 1 tablet (50 mg total) by mouth daily.     Cardiovascular:  Angiotensin Receptor Blockers Failed - 11/29/2023  2:56 PM      Failed - Cr in normal range and within 180 days    Creatinine  Date Value Ref Range Status  08/10/2023 1.2 (A) 0.5 - 1.1 Final   Creatinine, Ser  Date Value Ref Range Status  11/09/2022 1.29 (H) 0.57 - 1.00 mg/dL Final   Creatinine, Urine  Date Value Ref  Range Status  08/10/2023 128  Final         Failed - Valid encounter within last 6 months    Recent Outpatient Visits   None            Passed - K in normal range and within 180 days    Potassium  Date Value Ref Range Status  08/10/2023 4.2 3.5 - 5.1 mEq/L Final         Passed - Patient is not pregnant      Passed - Last BP in normal range    BP Readings from Last 1 Encounters:  06/16/23 118/60          potassium chloride  (KLOR-CON  M) 10 MEQ tablet 60 tablet 2    Sig: Take 2 tablets (20 mEq total) by mouth daily.     Endocrinology:  Minerals - Potassium Supplementation Failed - 11/29/2023  2:56 PM      Failed - Cr in normal range and within 360 days    Creatinine  Date Value Ref Range Status  08/10/2023 1.2 (A) 0.5 - 1.1 Final   Creatinine, Ser  Date Value Ref Range Status  11/09/2022 1.29 (H) 0.57 - 1.00 mg/dL Final   Creatinine, Urine  Date Value Ref Range Status  08/10/2023 128  Final         Failed - Valid encounter within last 12 months    Recent Outpatient Visits   None            Passed - K in normal range and within 360 days    Potassium  Date Value Ref Range Status  08/10/2023 4.2 3.5 - 5.1 mEq/L Final          Vitamin D , Ergocalciferol , 50000 units CAPS 13 capsule 0    Sig: Take 1 each by mouth once a week.     Endocrinology:  Vitamins - Vitamin D  Supplementation 2 Failed - 11/29/2023  2:56 PM      Failed -  Manual Review: Route requests for 50,000 IU strength to the provider      Failed - Vitamin D  in normal range and within 360 days    Vit D, 25-Hydroxy  Date Value Ref Range Status  08/15/2022 48.6 30.0 - 100.0 ng/mL Final    Comment:    Vitamin D  deficiency has been defined by the Institute of Medicine and an Endocrine Society practice guideline as a level of serum 25-OH vitamin D  less than 20 ng/mL (1,2). The Endocrine Society went on to further define vitamin D  insufficiency as a level between 21 and 29 ng/mL (2). 1. IOM (Institute of Medicine). 2010. Dietary reference    intakes for calcium and D. [place]: The    [employer]. 2. Holick MF, Binkley [place], Bischoff-Ferrari HA, et al.    Evaluation, treatment, and prevention of vitamin D     deficiency: an Endocrine Society clinical practice    guideline. JCEM. 2011 Jul; 96(7):1911-30.          Failed - Valid encounter within last 12 months    Recent Outpatient Visits   None            Passed - Ca in normal range and within 360 days    Calcium  Date Value Ref Range Status  08/10/2023 9.4 8.7 - 10.7 Final         Signed Prescriptions Disp Refills   furosemide  (LASIX ) 40 MG tablet 30 tablet 0    Sig: Take  1 tablet (40 mg total) by mouth daily.     Cardiovascular:  Diuretics - Loop Failed - 11/29/2023  2:56 PM      Failed - Cr in normal range and within 180 days    Creatinine  Date Value Ref Range Status  08/10/2023 1.2 (A) 0.5 - 1.1 Final   Creatinine, Ser  Date Value Ref Range Status  11/09/2022 1.29 (H) 0.57 - 1.00 mg/dL Final   Creatinine, Urine  Date Value Ref Range Status  08/10/2023 128  Final         Failed - Mg Level in normal range and within 180 days    No results found for: MG       Failed - Valid encounter within last 6 months    Recent Outpatient Visits   None            Passed - K in normal range and within 180 days    Potassium  Date Value Ref Range Status  08/10/2023  4.2 3.5 - 5.1 mEq/L Final         Passed - Ca in normal range and within 180 days    Calcium  Date Value Ref Range Status  08/10/2023 9.4 8.7 - 10.7 Final         Passed - Na in normal range and within 180 days    Sodium  Date Value Ref Range Status  08/10/2023 141 137 - 147 Final         Passed - Cl in normal range and within 180 days    Chloride  Date Value Ref Range Status  08/10/2023 105 99 - 108 Final         Passed - Last BP in normal range    BP Readings from Last 1 Encounters:  06/16/23 118/60          fluticasone  (FLONASE ) 50 MCG/ACT nasal spray 16 g 0    Sig: USE 2 SPRAYS INTO EACH NOSTRIL ONCE DAILY AS DIRECTED.     Ear, Nose, and Throat: Nasal Preparations - Corticosteroids Failed - 11/29/2023  2:56 PM      Failed - Valid encounter within last 12 months    Recent Outpatient Visits   None             montelukast  (SINGULAIR ) 10 MG tablet 30 tablet 0    Sig: Take 1 tablet (10 mg total) by mouth at bedtime.     Pulmonology:  Leukotriene Inhibitors Failed - 11/29/2023  2:56 PM      Failed - Valid encounter within last 12 months    Recent Outpatient Visits   None

## 2023-11-29 NOTE — Telephone Encounter (Signed)
 Last OV- 11/09/23, multiple no show/cancellations- [name] patient- has not seen provider in office yet Appt 01/09/24 Furosamide- no 6 month f/u- original Rx given for 1 year so will courtesy refill Will give courtesy on 12 month Rx- wil send other request with notations for office review  Requested Prescriptions  Pending Prescriptions Disp Refills   allopurinol  (ZYLOPRIM ) 100 MG tablet 30 tablet 0    Sig: Take 1 tablet (100 mg total) by mouth daily.     Endocrinology:  Gout Agents - allopurinol  Failed - 11/29/2023  2:54 PM      Failed - Uric Acid in normal range and within 360 days    Uric Acid  Date Value Ref Range Status  09/16/2016 8.4 (H) 2.5 - 7.1 mg/dL Final    Comment:               Therapeutic target for gout patients: <6.0         Failed - Cr in normal range and within 360 days    Creatinine  Date Value Ref Range Status  08/10/2023 1.2 (A) 0.5 - 1.1 Final   Creatinine, Ser  Date Value Ref Range Status  11/09/2022 1.29 (H) 0.57 - 1.00 mg/dL Final   Creatinine, Urine  Date Value Ref Range Status  08/10/2023 128  Final         Failed - Valid encounter within last 12 months    Recent Outpatient Visits   None            Failed - CBC within normal limits and completed in the last 12 months    WBC  Date Value Ref Range Status  08/10/2023 5.9  Final   RBC  Date Value Ref Range Status  08/10/2023 4.31 3.87 - 5.11 Final   Hemoglobin  Date Value Ref Range Status  08/10/2023 12.0 12.0 - 16.0 Final  08/15/2022 13.2 11.1 - 15.9 g/dL Final   HCT  Date Value Ref Range Status  08/10/2023 38 36 - 46 Final   Hematocrit  Date Value Ref Range Status  08/15/2022 40.3 34.0 - 46.6 % Final   MCHC  Date Value Ref Range Status  08/15/2022 32.8 31.5 - 35.7 g/dL Final   [facility]  Date Value Ref Range Status  08/15/2022 28.3 26.6 - 33.0 pg Final   MCV  Date Value Ref Range Status  08/15/2022 87 79 - 97 fL Final   No results found for: PLTCOUNTKUC, LABPLAT, POCPLA RDW   Date Value Ref Range Status  08/15/2022 12.9 11.7 - 15.4 % Final          furosemide  (LASIX ) 40 MG tablet 90 tablet 3    Sig: Take 1 tablet (40 mg total) by mouth daily.     Cardiovascular:  Diuretics - Loop Failed - 11/29/2023  2:54 PM      Failed - Cr in normal range and within 180 days    Creatinine  Date Value Ref Range Status  08/10/2023 1.2 (A) 0.5 - 1.1 Final   Creatinine, Ser  Date Value Ref Range Status  11/09/2022 1.29 (H) 0.57 - 1.00 mg/dL Final   Creatinine, Urine  Date Value Ref Range Status  08/10/2023 128  Final         Failed - Mg Level in normal range and within 180 days    No results found for: MG       Failed - Valid encounter within last 6 months    Recent Outpatient Visits   None  Passed - K in normal range and within 180 days    Potassium  Date Value Ref Range Status  08/10/2023 4.2 3.5 - 5.1 mEq/L Final         Passed - Ca in normal range and within 180 days    Calcium  Date Value Ref Range Status  08/10/2023 9.4 8.7 - 10.7 Final         Passed - Na in normal range and within 180 days    Sodium  Date Value Ref Range Status  08/10/2023 141 137 - 147 Final         Passed - Cl in normal range and within 180 days    Chloride  Date Value Ref Range Status  08/10/2023 105 99 - 108 Final         Passed - Last BP in normal range    BP Readings from Last 1 Encounters:  06/16/23 118/60          fluticasone  (FLONASE ) 50 MCG/ACT nasal spray 16 g 12    Sig: USE 2 SPRAYS INTO EACH NOSTRIL ONCE DAILY AS DIRECTED.     Ear, Nose, and Throat: Nasal Preparations - Corticosteroids Failed - 11/29/2023  2:54 PM      Failed - Valid encounter within last 12 months    Recent Outpatient Visits   None             levothyroxine  (SYNTHROID ) 75 MCG tablet 90 tablet 0    Sig: Take 1 tablet (75 mcg total) by mouth daily.     Endocrinology:  Hypothyroid Agents Failed - 11/29/2023  2:54 PM      Failed - TSH in normal range and within  360 days    TSH  Date Value Ref Range Status  11/09/2022 2.380 0.450 - 4.500 uIU/mL Final         Failed - Valid encounter within last 12 months    Recent Outpatient Visits   None             losartan  (COZAAR ) 50 MG tablet 90 tablet 1    Sig: Take 1 tablet (50 mg total) by mouth daily.     Cardiovascular:  Angiotensin Receptor Blockers Failed - 11/29/2023  2:54 PM      Failed - Cr in normal range and within 180 days    Creatinine  Date Value Ref Range Status  08/10/2023 1.2 (A) 0.5 - 1.1 Final   Creatinine, Ser  Date Value Ref Range Status  11/09/2022 1.29 (H) 0.57 - 1.00 mg/dL Final   Creatinine, Urine  Date Value Ref Range Status  08/10/2023 128  Final         Failed - Valid encounter within last 6 months    Recent Outpatient Visits   None            Passed - K in normal range and within 180 days    Potassium  Date Value Ref Range Status  08/10/2023 4.2 3.5 - 5.1 mEq/L Final         Passed - Patient is not pregnant      Passed - Last BP in normal range    BP Readings from Last 1 Encounters:  06/16/23 118/60          potassium chloride  (KLOR-CON  M) 10 MEQ tablet 60 tablet 2    Sig: Take 2 tablets (20 mEq total) by mouth daily.     Endocrinology:  Minerals - Potassium Supplementation Failed - 11/29/2023  2:54 PM      Failed - Cr in normal range and within 360 days    Creatinine  Date Value Ref Range Status  08/10/2023 1.2 (A) 0.5 - 1.1 Final   Creatinine, Ser  Date Value Ref Range Status  11/09/2022 1.29 (H) 0.57 - 1.00 mg/dL Final   Creatinine, Urine  Date Value Ref Range Status  08/10/2023 128  Final         Failed - Valid encounter within last 12 months    Recent Outpatient Visits   None            Passed - K in normal range and within 360 days    Potassium  Date Value Ref Range Status  08/10/2023 4.2 3.5 - 5.1 mEq/L Final          Vitamin D , Ergocalciferol , 50000 units CAPS 13 capsule 0    Sig: Take 1 each by mouth once a  week.     Endocrinology:  Vitamins - Vitamin D  Supplementation 2 Failed - 11/29/2023  2:54 PM      Failed - Manual Review: Route requests for 50,000 IU strength to the provider      Failed - Vitamin D  in normal range and within 360 days    Vit D, 25-Hydroxy  Date Value Ref Range Status  08/15/2022 48.6 30.0 - 100.0 ng/mL Final    Comment:    Vitamin D  deficiency has been defined by the Institute of Medicine and an Endocrine Society practice guideline as a level of serum 25-OH vitamin D  less than 20 ng/mL (1,2). The Endocrine Society went on to further define vitamin D  insufficiency as a level between 21 and 29 ng/mL (2). 1. IOM (Institute of Medicine). 2010. Dietary reference    intakes for calcium and D. [place]: The    [employer]. 2. Holick MF, Binkley [place], Bischoff-Ferrari HA, et al.    Evaluation, treatment, and prevention of vitamin D     deficiency: an Endocrine Society clinical practice    guideline. JCEM. 2011 Jul; 96(7):1911-30.          Failed - Valid encounter within last 12 months    Recent Outpatient Visits   None            Passed - Ca in normal range and within 360 days    Calcium  Date Value Ref Range Status  08/10/2023 9.4 8.7 - 10.7 Final          montelukast  (SINGULAIR ) 10 MG tablet 90 tablet 3    Sig: Take 1 tablet (10 mg total) by mouth at bedtime.     Pulmonology:  Leukotriene Inhibitors Failed - 11/29/2023  2:54 PM      Failed - Valid encounter within last 12 months    Recent Outpatient Visits   None

## 2023-11-29 NOTE — Telephone Encounter (Signed)
 Requested medication (s) are due for refill today: yes  Requested medication (s) are on the active medication list: yes  Last refill:  multiple dates  Future visit scheduled: yes  Notes to clinic:  Unable to refill per protocol, last refill by another provider.      Requested Prescriptions  Pending Prescriptions Disp Refills   allopurinol  (ZYLOPRIM ) 100 MG tablet 30 tablet 0    Sig: Take 1 tablet (100 mg total) by mouth daily.     Endocrinology:  Gout Agents - allopurinol  Failed - 11/29/2023  2:34 PM      Failed - Uric Acid in normal range and within 360 days    Uric Acid  Date Value Ref Range Status  09/16/2016 8.4 (H) 2.5 - 7.1 mg/dL Final    Comment:               Therapeutic target for gout patients: <6.0         Failed - Cr in normal range and within 360 days    Creatinine  Date Value Ref Range Status  08/10/2023 1.2 (A) 0.5 - 1.1 Final   Creatinine, Ser  Date Value Ref Range Status  11/09/2022 1.29 (H) 0.57 - 1.00 mg/dL Final   Creatinine, Urine  Date Value Ref Range Status  08/10/2023 128  Final         Failed - Valid encounter within last 12 months    Recent Outpatient Visits   None            Failed - CBC within normal limits and completed in the last 12 months    WBC  Date Value Ref Range Status  08/10/2023 5.9  Final   RBC  Date Value Ref Range Status  08/10/2023 4.31 3.87 - 5.11 Final   Hemoglobin  Date Value Ref Range Status  08/10/2023 12.0 12.0 - 16.0 Final  08/15/2022 13.2 11.1 - 15.9 g/dL Final   HCT  Date Value Ref Range Status  08/10/2023 38 36 - 46 Final   Hematocrit  Date Value Ref Range Status  08/15/2022 40.3 34.0 - 46.6 % Final   MCHC  Date Value Ref Range Status  08/15/2022 32.8 31.5 - 35.7 g/dL Final   [facility]  Date Value Ref Range Status  08/15/2022 28.3 26.6 - 33.0 pg Final   MCV  Date Value Ref Range Status  08/15/2022 87 79 - 97 fL Final   No results found for: PLTCOUNTKUC, LABPLAT, POCPLA RDW  Date  Value Ref Range Status  08/15/2022 12.9 11.7 - 15.4 % Final          furosemide  (LASIX ) 40 MG tablet 90 tablet 3    Sig: Take 1 tablet (40 mg total) by mouth daily.     Cardiovascular:  Diuretics - Loop Failed - 11/29/2023  2:34 PM      Failed - Cr in normal range and within 180 days    Creatinine  Date Value Ref Range Status  08/10/2023 1.2 (A) 0.5 - 1.1 Final   Creatinine, Ser  Date Value Ref Range Status  11/09/2022 1.29 (H) 0.57 - 1.00 mg/dL Final   Creatinine, Urine  Date Value Ref Range Status  08/10/2023 128  Final         Failed - Mg Level in normal range and within 180 days    No results found for: MG       Failed - Valid encounter within last 6 months    Recent Outpatient Visits  None            Passed - K in normal range and within 180 days    Potassium  Date Value Ref Range Status  08/10/2023 4.2 3.5 - 5.1 mEq/L Final         Passed - Ca in normal range and within 180 days    Calcium  Date Value Ref Range Status  08/10/2023 9.4 8.7 - 10.7 Final         Passed - Na in normal range and within 180 days    Sodium  Date Value Ref Range Status  08/10/2023 141 137 - 147 Final         Passed - Cl in normal range and within 180 days    Chloride  Date Value Ref Range Status  08/10/2023 105 99 - 108 Final         Passed - Last BP in normal range    BP Readings from Last 1 Encounters:  06/16/23 118/60          fluticasone  (FLONASE ) 50 MCG/ACT nasal spray 16 g 12    Sig: USE 2 SPRAYS INTO EACH NOSTRIL ONCE DAILY AS DIRECTED.     Ear, Nose, and Throat: Nasal Preparations - Corticosteroids Failed - 11/29/2023  2:34 PM      Failed - Valid encounter within last 12 months    Recent Outpatient Visits   None             levothyroxine  (SYNTHROID ) 75 MCG tablet 90 tablet 0    Sig: Take 1 tablet (75 mcg total) by mouth daily.     Endocrinology:  Hypothyroid Agents Failed - 11/29/2023  2:34 PM      Failed - TSH in normal range and within 360  days    TSH  Date Value Ref Range Status  11/09/2022 2.380 0.450 - 4.500 uIU/mL Final         Failed - Valid encounter within last 12 months    Recent Outpatient Visits   None             losartan  (COZAAR ) 50 MG tablet 90 tablet 1    Sig: Take 1 tablet (50 mg total) by mouth daily.     Cardiovascular:  Angiotensin Receptor Blockers Failed - 11/29/2023  2:34 PM      Failed - Cr in normal range and within 180 days    Creatinine  Date Value Ref Range Status  08/10/2023 1.2 (A) 0.5 - 1.1 Final   Creatinine, Ser  Date Value Ref Range Status  11/09/2022 1.29 (H) 0.57 - 1.00 mg/dL Final   Creatinine, Urine  Date Value Ref Range Status  08/10/2023 128  Final         Failed - Valid encounter within last 6 months    Recent Outpatient Visits   None            Passed - K in normal range and within 180 days    Potassium  Date Value Ref Range Status  08/10/2023 4.2 3.5 - 5.1 mEq/L Final         Passed - Patient is not pregnant      Passed - Last BP in normal range    BP Readings from Last 1 Encounters:  06/16/23 118/60          potassium chloride  (KLOR-CON  M) 10 MEQ tablet 60 tablet 2    Sig: Take 2 tablets (20 mEq total) by mouth daily.  Endocrinology:  Minerals - Potassium Supplementation Failed - 11/29/2023  2:34 PM      Failed - Cr in normal range and within 360 days    Creatinine  Date Value Ref Range Status  08/10/2023 1.2 (A) 0.5 - 1.1 Final   Creatinine, Ser  Date Value Ref Range Status  11/09/2022 1.29 (H) 0.57 - 1.00 mg/dL Final   Creatinine, Urine  Date Value Ref Range Status  08/10/2023 128  Final         Failed - Valid encounter within last 12 months    Recent Outpatient Visits   None            Passed - K in normal range and within 360 days    Potassium  Date Value Ref Range Status  08/10/2023 4.2 3.5 - 5.1 mEq/L Final          Vitamin D , Ergocalciferol , 50000 units CAPS 13 capsule 0    Sig: Take 1 each by mouth once a week.      Endocrinology:  Vitamins - Vitamin D  Supplementation 2 Failed - 11/29/2023  2:34 PM      Failed - Manual Review: Route requests for 50,000 IU strength to the provider      Failed - Vitamin D  in normal range and within 360 days    Vit D, 25-Hydroxy  Date Value Ref Range Status  08/15/2022 48.6 30.0 - 100.0 ng/mL Final    Comment:    Vitamin D  deficiency has been defined by the Institute of Medicine and an Endocrine Society practice guideline as a level of serum 25-OH vitamin D  less than 20 ng/mL (1,2). The Endocrine Society went on to further define vitamin D  insufficiency as a level between 21 and 29 ng/mL (2). 1. IOM (Institute of Medicine). 2010. Dietary reference    intakes for calcium and D. [place]: The    [employer]. 2. Holick MF, Binkley [place], Bischoff-Ferrari HA, et al.    Evaluation, treatment, and prevention of vitamin D     deficiency: an Endocrine Society clinical practice    guideline. JCEM. 2011 Jul; 96(7):1911-30.          Failed - Valid encounter within last 12 months    Recent Outpatient Visits   None            Passed - Ca in normal range and within 360 days    Calcium  Date Value Ref Range Status  08/10/2023 9.4 8.7 - 10.7 Final          montelukast  (SINGULAIR ) 10 MG tablet 90 tablet 3    Sig: Take 1 tablet (10 mg total) by mouth at bedtime.     Pulmonology:  Leukotriene Inhibitors Failed - 11/29/2023  2:34 PM      Failed - Valid encounter within last 12 months    Recent Outpatient Visits   None

## 2023-12-18 ENCOUNTER — Other Ambulatory Visit: Payer: Self-pay | Admitting: Physician Assistant

## 2023-12-18 DIAGNOSIS — M1A09X0 Idiopathic chronic gout, multiple sites, without tophus (tophi): Secondary | ICD-10-CM

## 2023-12-21 ENCOUNTER — Encounter: Payer: Self-pay | Admitting: Family Medicine

## 2024-01-09 ENCOUNTER — Ambulatory Visit: Admitting: Physician Assistant

## 2024-01-16 ENCOUNTER — Telehealth: Payer: Self-pay

## 2024-01-16 NOTE — Progress Notes (Signed)
 Pharmacy Quality Measure Review  This patient is appearing on a report for being at risk of failing the adherence measure for hypertension (ACEi/ARB) medications this calendar year.   Medication: losartan  50 mg Last fill date: 11/29/23 for 90 day supply  Insurance report was not up to date. No action needed at this time.

## 2024-01-17 ENCOUNTER — Other Ambulatory Visit: Payer: Self-pay | Admitting: Physician Assistant

## 2024-01-17 ENCOUNTER — Other Ambulatory Visit: Payer: Self-pay | Admitting: Family Medicine

## 2024-01-17 ENCOUNTER — Telehealth: Payer: Self-pay | Admitting: Physician Assistant

## 2024-01-17 DIAGNOSIS — S39012D Strain of muscle, fascia and tendon of lower back, subsequent encounter: Secondary | ICD-10-CM

## 2024-01-17 DIAGNOSIS — R601 Generalized edema: Secondary | ICD-10-CM

## 2024-01-17 NOTE — Telephone Encounter (Signed)
 [facility] faxed refill request for the following medications:   potassium chloride  (KLOR-CON  M) 10 MEQ tablet    Please advise.

## 2024-01-17 NOTE — Telephone Encounter (Signed)
 LOV- 11/09/2022 NOV- 01/31/2024 LRF- IBUPROFEN  600 MG 270 x 3

## 2024-01-18 ENCOUNTER — Other Ambulatory Visit: Payer: Self-pay

## 2024-01-18 NOTE — Telephone Encounter (Signed)
Converted to refill request 

## 2024-01-18 NOTE — Telephone Encounter (Signed)
 Patient is overdue for appointment but has one scheduled for 01/31/24

## 2024-01-25 ENCOUNTER — Other Ambulatory Visit: Payer: Self-pay | Admitting: Podiatry

## 2024-01-25 ENCOUNTER — Ambulatory Visit
Admission: RE | Admit: 2024-01-25 | Discharge: 2024-01-25 | Disposition: A | Discharge status: Home / Self Care | Source: Ambulatory Visit | Attending: Podiatry | Admitting: Podiatry

## 2024-01-25 DIAGNOSIS — M79672 Pain in left foot: Secondary | ICD-10-CM | POA: Diagnosis not present

## 2024-01-25 DIAGNOSIS — R600 Localized edema: Secondary | ICD-10-CM | POA: Diagnosis not present

## 2024-01-25 DIAGNOSIS — R7303 Prediabetes: Secondary | ICD-10-CM | POA: Diagnosis not present

## 2024-01-25 DIAGNOSIS — N1832 Chronic kidney disease, stage 3b: Secondary | ICD-10-CM | POA: Diagnosis not present

## 2024-01-31 ENCOUNTER — Encounter: Payer: Self-pay | Admitting: Physician Assistant

## 2024-01-31 ENCOUNTER — Ambulatory Visit (INDEPENDENT_AMBULATORY_CARE_PROVIDER_SITE_OTHER): Admitting: Physician Assistant

## 2024-01-31 VITALS — BP 128/61 | HR 55 | Temp 97.9°F | Ht 68.0 in

## 2024-01-31 DIAGNOSIS — J301 Allergic rhinitis due to pollen: Secondary | ICD-10-CM | POA: Diagnosis not present

## 2024-01-31 DIAGNOSIS — E034 Atrophy of thyroid (acquired): Secondary | ICD-10-CM | POA: Diagnosis not present

## 2024-01-31 DIAGNOSIS — M1A09X0 Idiopathic chronic gout, multiple sites, without tophus (tophi): Secondary | ICD-10-CM | POA: Diagnosis not present

## 2024-01-31 DIAGNOSIS — J4520 Mild intermittent asthma, uncomplicated: Secondary | ICD-10-CM

## 2024-01-31 DIAGNOSIS — Z1231 Encounter for screening mammogram for malignant neoplasm of breast: Secondary | ICD-10-CM

## 2024-01-31 DIAGNOSIS — L409 Psoriasis, unspecified: Secondary | ICD-10-CM | POA: Diagnosis not present

## 2024-01-31 DIAGNOSIS — R7303 Prediabetes: Secondary | ICD-10-CM | POA: Diagnosis not present

## 2024-01-31 DIAGNOSIS — E559 Vitamin D deficiency, unspecified: Secondary | ICD-10-CM | POA: Diagnosis not present

## 2024-01-31 DIAGNOSIS — N1832 Chronic kidney disease, stage 3b: Secondary | ICD-10-CM

## 2024-01-31 DIAGNOSIS — N1831 Chronic kidney disease, stage 3a: Secondary | ICD-10-CM

## 2024-01-31 DIAGNOSIS — E66812 Morbid (severe) obesity due to excess calories: Secondary | ICD-10-CM

## 2024-01-31 DIAGNOSIS — E876 Hypokalemia: Secondary | ICD-10-CM

## 2024-01-31 DIAGNOSIS — Z7689 Persons encountering health services in other specified circumstances: Secondary | ICD-10-CM

## 2024-01-31 MED ORDER — LEVOTHYROXINE SODIUM 75 MCG PO TABS: 75.0000 ug | ORAL_TABLET | Freq: Every day | ORAL | 0 refills | Status: DC

## 2024-01-31 MED ORDER — MONTELUKAST SODIUM 10 MG PO TABS: 10.0000 mg | ORAL_TABLET | Freq: Every day | ORAL | 0 refills | Status: DC

## 2024-01-31 MED ORDER — ALLOPURINOL 100 MG PO TABS: 100.0000 mg | ORAL_TABLET | Freq: Every day | ORAL | 0 refills | Status: DC

## 2024-01-31 MED ORDER — FLUTICASONE PROPIONATE 50 MCG/ACT NA SUSP: NASAL | 0 refills | Status: DC

## 2024-01-31 MED ORDER — POTASSIUM CHLORIDE CRYS ER 10 MEQ PO TBCR: 20.0000 meq | EXTENDED_RELEASE_TABLET | Freq: Every day | ORAL | 2 refills | Status: DC

## 2024-01-31 NOTE — Progress Notes (Addendum)
 Established patient visit  Patient: [name]   DOB: 10-02-1955   68 y.o. Female  MRN: [id number] Visit Date: 01/31/2024  Today's healthcare provider: Jolynn Spencer, PA-C   Chief Complaint  Patient presents with   Medical Management of Chronic Issues    Patient presents for follow up on chronic conditions.     Subjective     HPI     Medical Management of Chronic Issues    Additional comments: Patient presents for follow up on chronic conditions.        Last edited by Cherry Chiquita CHRISTELLA, CMA on 01/31/2024  1:50 PM.       Discussed the use of AI scribe software for clinical note transcription with the patient, who gave verbal consent to proceed.  History of Present Illness [name] is a 68 year old female who presents for medication management and follow-up.  She has experienced foot pain and swelling for three weeks after [place]. An x-ray and ultrasound ruled out a fracture and blood clot, respectively. Her foot remains swollen, with toes resembling 'Vienna sausages'. She manages pain with infrequent ibuprofen  use.  Her medications include levothyroxine , losartan , montelukast , Patanol, a liquid steroid for scalp psoriasis, Flonase , allopurinol  , and potassium supplements. She is unsure about the necessity of potassium. Crystora is prescribed for cholesterol by her cardiologist.  She has chronic kidney disease, prediabetes, and psoriasis on her scalp. She underwent a sleep study and uses a CPAP machine. She has not used significant over-the-counter medications like Tylenol and has not eaten today, only had something to drink.       01/31/2024    2:01 PM 03/21/2023    1:45 PM 11/09/2022   10:20 AM  Depression screen PHQ 2/9  Decreased Interest 0 0 0  Down, Depressed, Hopeless 0 0 0  PHQ - 2 Score 0 0 0  Altered sleeping 0  0  Tired, decreased energy 1  0  Change in appetite 0  0  Feeling bad or failure about yourself  0  0  Trouble  concentrating 0  0  Moving slowly or fidgety/restless 0  0  Suicidal thoughts 0  0  PHQ-9 Score 1  0  Difficult doing work/chores Not difficult at all  Not difficult at all      01/31/2024    2:01 PM  GAD 7 : Generalized Anxiety Score  Nervous, Anxious, on Edge 0  Control/stop worrying 0  Worry too much - different things 0  Trouble relaxing 0  Restless 0  Easily annoyed or irritable 0  Afraid - awful might happen 0  Total GAD 7 Score 0    Medications: Outpatient Medications Prior to Visit  Medication Sig   albuterol  (VENTOLIN  HFA) 108 (90 Base) MCG/ACT inhaler Inhale 2 puffs into the lungs every 6 (six) hours as needed.   celecoxib (CELEBREX) 200 MG capsule Take 200 mg by mouth daily.   furosemide  (LASIX ) 40 MG tablet TAKE 1 TABLET BY MOUTH DAILY   losartan  (COZAAR ) 50 MG tablet Take 1 tablet (50 mg total) by mouth daily.   olopatadine  (PATANOL) 0.1 % ophthalmic solution Place 1 drop into both eyes 2 (two) times daily.   rosuvastatin (CRESTOR) 5 MG tablet Take 5 mg by mouth daily.   Vitamin D , Ergocalciferol , 50000 units CAPS Take 1 each by mouth once a week.   [DISCONTINUED] allopurinol  (ZYLOPRIM ) 100 MG tablet TAKE ONE TABLET BY MOUTH ONCE DAILY   [DISCONTINUED]  fluticasone  (FLONASE ) 50 MCG/ACT nasal spray USE 2 SPRAYS INTO EACH NOSTRIL ONCE DAILY AS DIRECTED.   [DISCONTINUED] levothyroxine  (SYNTHROID ) 75 MCG tablet Take 1 tablet (75 mcg total) by mouth daily.   [DISCONTINUED] montelukast  (SINGULAIR ) 10 MG tablet Take 1 tablet (10 mg total) by mouth at bedtime.   [DISCONTINUED] potassium chloride  (KLOR-CON  M) 10 MEQ tablet Take 2 tablets (20 mEq total) by mouth daily.   cyclobenzaprine  (FLEXERIL ) 5 MG tablet Take 5 mg by mouth 3 (three) times daily as needed for muscle spasms. (Patient not taking: Reported on 01/31/2024)   hydrOXYzine  (ATARAX ) 25 MG tablet Take 25 mg by mouth 3 (three) times daily as needed. (Patient not taking: Reported on 01/31/2024)   ibuprofen  (IBU) 600 MG  tablet TAKE 1 TABLET BY MOUTH EVERY 8 HOURS AS NEEDED. (Patient not taking: Reported on 01/31/2024)   [DISCONTINUED] clobetasol  (TEMOVATE ) 0.05 % external solution Apply 1 Application topically as needed.   [DISCONTINUED] Clocortolone  Pivalate (CLODERM ) 0.1 % cream Apply 1 Application topically 2 (two) times daily. (Patient not taking: Reported on 01/31/2024)   [DISCONTINUED] dapagliflozin  propanediol (FARXIGA ) 10 MG TABS tablet Take 1 tablet (10 mg total) by mouth daily before breakfast.   [DISCONTINUED] metFORMIN (GLUCOPHAGE) 500 MG tablet Take 500 mg by mouth daily with breakfast.   [DISCONTINUED] NON FORMULARY CPAP nightly. (Patient not taking: Reported on 01/31/2024)   No facility-administered medications prior to visit.    Review of Systems All negative Except see HPI       Objective    BP 128/61 (BP Location: Left Arm, Patient Position: Sitting, Cuff Size: Normal)   Pulse (!) 55   Temp 97.9 F (36.6 C) (Oral)   Ht 5' 8 (1.727 m)   SpO2 100%   BMI 36.23 kg/m     Physical Exam Vitals reviewed.  Constitutional:      General: She is not in acute distress.    Appearance: Normal appearance. She is well-developed. She is not diaphoretic.  HENT:     Head: Normocephalic and atraumatic.  Eyes:     General: No scleral icterus.    Conjunctiva/sclera: Conjunctivae normal.  Neck:     Thyroid : No thyromegaly.  Cardiovascular:     Rate and Rhythm: Normal rate and regular rhythm.     Pulses: Normal pulses.     Heart sounds: Normal heart sounds. No murmur heard. Pulmonary:     Effort: Pulmonary effort is normal. No respiratory distress.     Breath sounds: Normal breath sounds. No wheezing, rhonchi or rales.  Musculoskeletal:     Cervical back: Neck supple.     Right lower leg: No edema.     Left lower leg: No edema.  Lymphadenopathy:     Cervical: No cervical adenopathy.  Skin:    General: Skin is warm and dry.     Findings: No rash.  Neurological:     Mental Status: She  is alert and oriented to person, place, and time. Mental status is at baseline.  Psychiatric:        Mood and Affect: Mood normal.        Behavior: Behavior normal.      No results found for any visits on 01/31/24.      Assessment & Plan Chronic kidney disease Chronic kidney disease with preference for Celebrex over ibuprofen  due to slightly reduced gastrointestinal and renal side effects. - Monitor kidney function with blood work. Drink plenty of water  Prediabetes Chronic A1c 6.3 Low carb and daily exercise  advised Will follow-up  Hypothyroidism Chronic and previously stable Hypothyroidism managed with levothyroxine  (Synthroid ). - Continue levothyroxine  (Synthroid ) 75mcg Will follow-up  Hypertension Chronic and stable Hypertension managed with losartan  50, lasix  40 Low salt diet and regular exercise advised Will follow-up.  Hyperlipidemia Chronic Hyperlipidemia management discussed with cardiologist. Continue rosuvastatin 5 Will follow-up  Allergic rhinitis Allergic rhinitis managed with Patanol and Flonase . - Prescribe Flonase . Will follow-up  Gout Chronic Gout managed with allopurinol . Uric acid levels need to be checked to guide treatment. - Prescribe allopurinol  temporarily. - Check uric acid levels. Will follow-up  Asthma Chronic and stable Asthma managed with albuterol  as needed. - continue with albuterol  Will follow-up  Vitamin D  deficiency Vitamin D  levels need to be checked to determine if supplementation is necessary. - Check vitamin D  levels. Hold on current vit d 50,000  Hypokalemia (possible) Hypokalemia suspected. Potassium levels need to be checked to confirm diagnosis and guide treatment. - Check potassium levels. - Prescribe potassium if needed.   Hypothyroidism due to acquired atrophy of thyroid   - levothyroxine  (SYNTHROID ) 75 MCG tablet; Take 1 tablet (75 mcg total) by mouth daily.  Dispense: 90 tablet; Refill: 0 - CBC with  Differential/Platelet - Comprehensive metabolic panel with GFR - Hemoglobin A1c - Lipid panel - TSH - Uric acid  Seasonal allergic rhinitis due to pollen  - montelukast  (SINGULAIR ) 10 MG tablet; Take 1 tablet (10 mg total) by mouth at bedtime.  Dispense: 90 tablet; Refill: 0 - fluticasone  (FLONASE ) 50 MCG/ACT nasal spray; USE 2 SPRAYS INTO EACH NOSTRIL ONCE DAILY AS DIRECTED.  Dispense: 16 g; Refill: 0 - CBC with Differential/Platelet - Comprehensive metabolic panel with GFR - Hemoglobin A1c - TSH  Mild intermittent asthma without complication  - montelukast  (SINGULAIR ) 10 MG tablet; Take 1 tablet (10 mg total) by mouth at bedtime.  Dispense: 90 tablet; Refill: 0  Chronic gout of multiple sites, unspecified cause  - allopurinol  (ZYLOPRIM ) 100 MG tablet; Take 1 tablet (100 mg total) by mouth daily.  Dispense: 90 tablet; Refill: 0 - CBC with Differential/Platelet - Comprehensive metabolic panel with GFR - Hemoglobin A1c - Lipid panel  Vitamin D  deficiency (Primary)  - VITAMIN D  25 Hydroxy (Vit-D Deficiency, Fractures)  Prediabetes  - CBC with Differential/Platelet - Comprehensive metabolic panel with GFR - Hemoglobin A1c - Lipid panel - TSH  Morbid obesity with Body mass index is 36.23 kg/m. Associated with hld, htn, ckd Ordered Weight loss of 5% of pt's current weight via healthy diet and daily exercise encouraged.  - CBC with Differential/Platelet - Comprehensive metabolic panel with GFR - Hemoglobin A1c - Lipid panel - TSH - Uric acid Will follow-up  Stage 3b chronic kidney disease (HCC) - CBC with Differential/Platelet - Comprehensive metabolic panel with GFR - Hemoglobin A1c - Lipid panel - TSH - Uric acid  Psoriasis Chronic and stable Uses steroid for scalp/attach. of Cpap machine Will follow-up  Encounter for screening mammogram for malignant neoplasm of breast - screening mammogram  Establishing care with new doctor, encounter  for Transition of care from [facility]  Orders Placed This Encounter  Procedures   CBC with Differential/Platelet   Comprehensive metabolic panel with GFR    Has the patient fasted?:   Yes   Hemoglobin A1c   Lipid panel    Has the patient fasted?:   Yes   TSH   Uric acid   VITAMIN D  25 Hydroxy (Vit-D Deficiency, Fractures)    Return in about 6 weeks (around 03/13/2024) for chronic  disease f/u.   The patient was advised to call back or seek an in-person evaluation if the symptoms worsen or if the condition fails to improve as anticipated.  I discussed the assessment and treatment plan with the patient. The patient was provided an opportunity to ask questions and all were answered. The patient agreed with the plan and demonstrated an understanding of the instructions.  I, Estefani Bateson, PA-C have reviewed all documentation for this visit. The documentation on 01/31/2024  for the exam, diagnosis, procedures, and orders are all accurate and complete.  Jolynn Spencer, [facility], MMS [facility] [phone] (phone) [phone] (fax)  [facility]

## 2024-02-01 DIAGNOSIS — N1832 Chronic kidney disease, stage 3b: Secondary | ICD-10-CM | POA: Diagnosis not present

## 2024-02-01 DIAGNOSIS — E66812 Obesity, class 2: Secondary | ICD-10-CM | POA: Diagnosis not present

## 2024-02-01 DIAGNOSIS — R7303 Prediabetes: Secondary | ICD-10-CM | POA: Diagnosis not present

## 2024-02-01 DIAGNOSIS — J301 Allergic rhinitis due to pollen: Secondary | ICD-10-CM | POA: Diagnosis not present

## 2024-02-01 DIAGNOSIS — E034 Atrophy of thyroid (acquired): Secondary | ICD-10-CM | POA: Diagnosis not present

## 2024-02-01 DIAGNOSIS — M1A09X0 Idiopathic chronic gout, multiple sites, without tophus (tophi): Secondary | ICD-10-CM | POA: Diagnosis not present

## 2024-02-01 DIAGNOSIS — Z6836 Body mass index (BMI) 36.0-36.9, adult: Secondary | ICD-10-CM | POA: Diagnosis not present

## 2024-02-02 LAB — CBC WITH DIFFERENTIAL/PLATELET
Basophils Absolute: 0 x10E3/uL (ref 0.0–0.2)
Basos: 1 %
EOS (ABSOLUTE): 0.1 x10E3/uL (ref 0.0–0.4)
Eos: 2 %
Hematocrit: 38.7 % (ref 34.0–46.6)
Hemoglobin: 12.3 g/dL (ref 11.1–15.9)
Immature Grans (Abs): 0 x10E3/uL (ref 0.0–0.1)
Immature Granulocytes: 0 %
Lymphocytes Absolute: 1.2 x10E3/uL (ref 0.7–3.1)
Lymphs: 19 %
MCH: 28.6 pg (ref 26.6–33.0)
MCHC: 31.8 g/dL (ref 31.5–35.7)
MCV: 90 fL (ref 79–97)
Monocytes Absolute: 0.4 x10E3/uL (ref 0.1–0.9)
Monocytes: 7 %
Neutrophils Absolute: 4.6 x10E3/uL (ref 1.4–7.0)
Neutrophils: 71 %
Platelets: 176 x10E3/uL (ref 150–450)
RBC: 4.3 x10E6/uL (ref 3.77–5.28)
RDW: 12.9 % (ref 11.7–15.4)
WBC: 6.3 x10E3/uL (ref 3.4–10.8)

## 2024-02-02 LAB — COMPREHENSIVE METABOLIC PANEL WITH GFR
ALT: 18 IU/L (ref 0–32)
AST: 21 IU/L (ref 0–40)
Albumin: 4.5 g/dL (ref 3.9–4.9)
Alkaline Phosphatase: 94 IU/L (ref 44–121)
BUN/Creatinine Ratio: 16 (ref 12–28)
BUN: 19 mg/dL (ref 8–27)
Bilirubin Total: 0.8 mg/dL (ref 0.0–1.2)
CO2: 22 mmol/L (ref 20–29)
Calcium: 9.6 mg/dL (ref 8.7–10.3)
Chloride: 103 mmol/L (ref 96–106)
Creatinine, Ser: 1.17 mg/dL — ABNORMAL HIGH (ref 0.57–1.00)
Globulin, Total: 2.1 g/dL (ref 1.5–4.5)
Glucose: 104 mg/dL — ABNORMAL HIGH (ref 70–99)
Potassium: 4.3 mmol/L (ref 3.5–5.2)
Sodium: 141 mmol/L (ref 134–144)
Total Protein: 6.6 g/dL (ref 6.0–8.5)
eGFR: 50 mL/min/1.73 — ABNORMAL LOW (ref >59)

## 2024-02-02 LAB — LIPID PANEL
Chol/HDL Ratio: 2 ratio (ref 0.0–4.4)
Cholesterol, Total: 131 mg/dL (ref 100–199)
HDL: 64 mg/dL (ref >39)
LDL Chol Calc (NIH): 54 mg/dL (ref 0–99)
Triglycerides: 63 mg/dL (ref 0–149)
VLDL Cholesterol Cal: 13 mg/dL (ref 5–40)

## 2024-02-02 LAB — HEMOGLOBIN A1C
Est. average glucose Bld gHb Est-mCnc: 134 mg/dL
Hgb A1c MFr Bld: 6.3 % — ABNORMAL HIGH (ref 4.8–5.6)

## 2024-02-02 LAB — URIC ACID: Uric Acid: 6.7 mg/dL (ref 3.0–7.2)

## 2024-02-02 LAB — VITAMIN D 25 HYDROXY (VIT D DEFICIENCY, FRACTURES): Vit D, 25-Hydroxy: 50.2 ng/mL (ref 30.0–100.0)

## 2024-02-02 LAB — TSH: TSH: 1.9 u[IU]/mL (ref 0.450–4.500)

## 2024-02-06 ENCOUNTER — Ambulatory Visit: Payer: Self-pay | Admitting: Physician Assistant

## 2024-02-12 NOTE — Progress Notes (Signed)
 Pharmacy Quality Measure Review  This patient is appearing on a report for being at risk of failing the adherence measure for hypertension (ACEi/ARB) medications this calendar year.   Medication: losartan  50 mg Last fill date: 11/29/23 for 90 day supply  Insurance report was not up to date. No action needed at this time.   Earnestene Angello E. Marsh, PharmD Clinical Pharmacist [facility] [phone]

## 2024-02-20 ENCOUNTER — Other Ambulatory Visit: Payer: Self-pay | Admitting: Physician Assistant

## 2024-02-20 DIAGNOSIS — E559 Vitamin D deficiency, unspecified: Secondary | ICD-10-CM

## 2024-02-27 DIAGNOSIS — R600 Localized edema: Secondary | ICD-10-CM | POA: Diagnosis not present

## 2024-02-27 DIAGNOSIS — R7303 Prediabetes: Secondary | ICD-10-CM | POA: Diagnosis not present

## 2024-02-27 DIAGNOSIS — M25572 Pain in left ankle and joints of left foot: Secondary | ICD-10-CM | POA: Diagnosis not present

## 2024-02-27 DIAGNOSIS — M79672 Pain in left foot: Secondary | ICD-10-CM | POA: Diagnosis not present

## 2024-02-27 DIAGNOSIS — N1832 Chronic kidney disease, stage 3b: Secondary | ICD-10-CM | POA: Diagnosis not present

## 2024-03-07 NOTE — Progress Notes (Signed)
 Pharmacy Quality Measure Review  This patient is appearing on a report for being at risk of failing the adherence measure for hypertension (ACEi/ARB) medications this calendar year.   Medication: losartan  Last fill date: 11/29/23 for 90 day supply  Contacted pharmacy to facilitate refills.  Nelvin Tomb E. Marsh, PharmD Clinical Pharmacist [facility] [phone]

## 2024-03-14 ENCOUNTER — Ambulatory Visit: Admitting: Physician Assistant

## 2024-03-16 ENCOUNTER — Other Ambulatory Visit: Payer: Self-pay | Admitting: Physician Assistant

## 2024-03-16 DIAGNOSIS — R601 Generalized edema: Secondary | ICD-10-CM

## 2024-03-18 ENCOUNTER — Other Ambulatory Visit: Payer: Self-pay | Admitting: Physician Assistant

## 2024-03-18 DIAGNOSIS — M1A09X0 Idiopathic chronic gout, multiple sites, without tophus (tophi): Secondary | ICD-10-CM

## 2024-03-18 DIAGNOSIS — R601 Generalized edema: Secondary | ICD-10-CM

## 2024-03-18 NOTE — Telephone Encounter (Unsigned)
 Copied from CRM [id number]. Topic: Clinical - Medication Refill >> Mar 18, 2024 10:01 AM Montie POUR wrote: Medication:  She is doing good on this medication furosemide  (LASIX ) 40 MG tablet - She is out of this medication as of yesterday allopurinol  (ZYLOPRIM ) 100 MG tablet  Has the patient contacted their pharmacy? Yes (Agent: If no, request that the patient contact the pharmacy for the refill. If patient does not wish to contact the pharmacy document the reason why and proceed with request.) (Agent: If yes, when and what did the pharmacy advise?) Pharmacy needs order to refill  This is the patient's preferred pharmacy:  [facility] - [place], [place] - [address] [place] [address] Phone: [phone] Fax: [phone]  Is this the correct pharmacy for this prescription? Yes If no, delete pharmacy and type the correct one.   Has the prescription been filled recently? No  Is the patient out of the medication? Yes  Has the patient been seen for an appointment in the last year OR does the patient have an upcoming appointment? Yes - Appointment on April 20, 2024- There was a death in family/friends and she cannot come this week.   Can we respond through MyChart? Yes  Agent: Please be advised that Rx refills may take up to 3 business days. We ask that you follow-up with your pharmacy.

## 2024-03-19 NOTE — Telephone Encounter (Signed)
 Requested Prescriptions  Refused Prescriptions Disp Refills   allopurinol  (ZYLOPRIM ) 100 MG tablet 90 tablet 0    Sig: Take 1 tablet (100 mg total) by mouth daily.     Endocrinology:  Gout Agents - allopurinol  Failed - 03/19/2024  5:36 PM      Failed - Cr in normal range and within 360 days    Creatinine, Ser  Date Value Ref Range Status  02/01/2024 1.17 (H) 0.57 - 1.00 mg/dL Final   Creatinine, Urine  Date Value Ref Range Status  08/10/2023 128  Final         Passed - Uric Acid in normal range and within 360 days    Uric Acid  Date Value Ref Range Status  02/01/2024 6.7 3.0 - 7.2 mg/dL Final    Comment:               Therapeutic target for gout patients: <6.0         Passed - Valid encounter within last 12 months    Recent Outpatient Visits           1 month ago Vitamin D  deficiency   [facility], [place], PA-C              Passed - CBC within normal limits and completed in the last 12 months    WBC  Date Value Ref Range Status  02/01/2024 6.3 3.4 - 10.8 x10E3/uL Final   RBC  Date Value Ref Range Status  02/01/2024 4.30 3.77 - 5.28 x10E6/uL Final  08/10/2023 4.31 3.87 - 5.11 Final   Hemoglobin  Date Value Ref Range Status  02/01/2024 12.3 11.1 - 15.9 g/dL Final   Hematocrit  Date Value Ref Range Status  02/01/2024 38.7 34.0 - 46.6 % Final   MCHC  Date Value Ref Range Status  02/01/2024 31.8 31.5 - 35.7 g/dL Final   [facility]  Date Value Ref Range Status  02/01/2024 28.6 26.6 - 33.0 pg Final   MCV  Date Value Ref Range Status  02/01/2024 90 79 - 97 fL Final   No results found for: PLTCOUNTKUC, LABPLAT, POCPLA RDW  Date Value Ref Range Status  02/01/2024 12.9 11.7 - 15.4 % Final          furosemide  (LASIX ) 40 MG tablet 30 tablet 0    Sig: Take 1 tablet (40 mg total) by mouth daily.     Cardiovascular:  Diuretics - Loop Failed - 03/19/2024  5:36 PM      Failed - Cr in normal range and within 180 days     Creatinine, Ser  Date Value Ref Range Status  02/01/2024 1.17 (H) 0.57 - 1.00 mg/dL Final   Creatinine, Urine  Date Value Ref Range Status  08/10/2023 128  Final         Failed - Mg Level in normal range and within 180 days    No results found for: MG       Passed - K in normal range and within 180 days    Potassium  Date Value Ref Range Status  02/01/2024 4.3 3.5 - 5.2 mmol/L Final         Passed - Ca in normal range and within 180 days    Calcium  Date Value Ref Range Status  02/01/2024 9.6 8.7 - 10.3 mg/dL Final         Passed - Na in normal range and within 180 days    Sodium  Date  Value Ref Range Status  02/01/2024 141 134 - 144 mmol/L Final         Passed - Cl in normal range and within 180 days    Chloride  Date Value Ref Range Status  02/01/2024 103 96 - 106 mmol/L Final         Passed - Last BP in normal range    BP Readings from Last 1 Encounters:  01/31/24 128/61         Passed - Valid encounter within last 6 months    Recent Outpatient Visits           1 month ago Vitamin D  deficiency   [facility] [place], [place], PA-C

## 2024-03-25 ENCOUNTER — Encounter: Payer: Self-pay | Admitting: Physician Assistant

## 2024-03-25 ENCOUNTER — Ambulatory Visit: Admitting: Physician Assistant

## 2024-03-25 VITALS — BP 118/55 | HR 56 | Temp 98.1°F | Ht 68.0 in | Wt 241.7 lb

## 2024-03-25 DIAGNOSIS — J3089 Other allergic rhinitis: Secondary | ICD-10-CM

## 2024-03-25 DIAGNOSIS — J4520 Mild intermittent asthma, uncomplicated: Secondary | ICD-10-CM | POA: Diagnosis not present

## 2024-03-25 DIAGNOSIS — E1169 Type 2 diabetes mellitus with other specified complication: Secondary | ICD-10-CM

## 2024-03-25 DIAGNOSIS — E1122 Type 2 diabetes mellitus with diabetic chronic kidney disease: Secondary | ICD-10-CM | POA: Diagnosis not present

## 2024-03-25 DIAGNOSIS — N1832 Chronic kidney disease, stage 3b: Secondary | ICD-10-CM

## 2024-03-25 DIAGNOSIS — E034 Atrophy of thyroid (acquired): Secondary | ICD-10-CM

## 2024-03-25 DIAGNOSIS — M1A09X0 Idiopathic chronic gout, multiple sites, without tophus (tophi): Secondary | ICD-10-CM

## 2024-03-25 DIAGNOSIS — E1159 Type 2 diabetes mellitus with other circulatory complications: Secondary | ICD-10-CM

## 2024-03-25 DIAGNOSIS — E876 Hypokalemia: Secondary | ICD-10-CM | POA: Diagnosis not present

## 2024-03-25 DIAGNOSIS — J301 Allergic rhinitis due to pollen: Secondary | ICD-10-CM | POA: Diagnosis not present

## 2024-03-25 DIAGNOSIS — I152 Hypertension secondary to endocrine disorders: Secondary | ICD-10-CM

## 2024-03-25 DIAGNOSIS — N1831 Chronic kidney disease, stage 3a: Secondary | ICD-10-CM | POA: Diagnosis not present

## 2024-03-25 DIAGNOSIS — E785 Hyperlipidemia, unspecified: Secondary | ICD-10-CM

## 2024-03-25 MED ORDER — FLUTICASONE PROPIONATE 50 MCG/ACT NA SUSP: NASAL | 0 refills | Status: AC

## 2024-03-25 NOTE — Progress Notes (Signed)
 Established patient visit  Patient: [name]   DOB: [birth year]   68 y.o. Female  MRN: [id number] Visit Date: 03/25/2024  Today's healthcare provider: Jolynn Spencer, PA-C   Chief Complaint  Patient presents with   Medical Management of Chronic Issues    Patient present for for chronic condition f/u. Reports feeling well overall . Reports no acute concerns    Subjective     HPI     Medical Management of Chronic Issues    Additional comments: Patient present for for chronic condition f/u. Reports feeling well overall . Reports no acute concerns       Last edited by Cherry Chiquita CHRISTELLA, CMA on 03/25/2024  9:37 AM.       Discussed the use of AI scribe software for clinical note transcription with the patient, who gave verbal consent to proceed.  History of Present Illness [name] is a 68 year old female with hypertension, gout, and diabetes who presents for a routine follow-up visit.  She experiences no chest pain, shortness of breath, or palpitations. There is a sensation of numbness but no blurry vision, weakness, or tingling. Her diabetes management shows a recent A1c of 6.3%. She continues on allopurinol  for gout with uric acid levels at 6.7 mg/dL. She maintains hydration with at least 120 ounces of water daily. Her medication regimen includes levothyroxine  with stable TSH levels, rosuvastatin for hyperlipidemia, and over-the-counter vitamin D . Asthma is managed with inhalers, with no recent wheezing or symptoms. Trigger: heat, cold, high altitude/mountains. She takes two potassium tablets daily to counteract furosemide  effects, with no muscle spasms. Her exercise routine includes walking, particularly during a recent beach vacation.       01/31/2024    2:01 PM 03/21/2023    1:45 PM 11/09/2022   10:20 AM  Depression screen PHQ 2/9  Decreased Interest 0 0 0  Down, Depressed, Hopeless 0 0 0  PHQ - 2 Score 0 0 0  Altered sleeping 0  0  Tired, decreased energy 1  0   Change in appetite 0  0  Feeling bad or failure about yourself  0  0  Trouble concentrating 0  0  Moving slowly or fidgety/restless 0  0  Suicidal thoughts 0  0  PHQ-9 Score 1  0  Difficult doing work/chores Not difficult at all  Not difficult at all      01/31/2024    2:01 PM  GAD 7 : Generalized Anxiety Score  Nervous, Anxious, on Edge 0  Control/stop worrying 0  Worry too much - different things 0  Trouble relaxing 0  Restless 0  Easily annoyed or irritable 0  Afraid - awful might happen 0  Total GAD 7 Score 0    Medications: Outpatient Medications Prior to Visit  Medication Sig   albuterol  (VENTOLIN  HFA) 108 (90 Base) MCG/ACT inhaler Inhale 2 puffs into the lungs every 6 (six) hours as needed.   allopurinol  (ZYLOPRIM ) 100 MG tablet Take 1 tablet (100 mg total) by mouth daily.   furosemide  (LASIX ) 40 MG tablet TAKE 1 TABLET BY MOUTH DAILY   ibuprofen  (IBU) 600 MG tablet TAKE 1 TABLET BY MOUTH EVERY 8 HOURS AS NEEDED. (Patient taking differently: daily as needed. TAKE 1 TABLET BY MOUTH EVERY 8 HOURS AS NEEDED.)   levothyroxine  (SYNTHROID ) 75 MCG tablet Take 1 tablet (75 mcg total) by mouth daily.   losartan  (COZAAR ) 50 MG tablet Take 1 tablet (50 mg total) by mouth daily.   montelukast  (  SINGULAIR ) 10 MG tablet Take 1 tablet (10 mg total) by mouth at bedtime.   potassium chloride  (KLOR-CON  M) 10 MEQ tablet Take 10 mEq by mouth 2 (two) times daily.   rosuvastatin (CRESTOR) 5 MG tablet Take 5 mg by mouth daily.   [DISCONTINUED] cyclobenzaprine  (FLEXERIL ) 5 MG tablet Take 5 mg by mouth 3 (three) times daily as needed for muscle spasms.   [DISCONTINUED] fluticasone  (FLONASE ) 50 MCG/ACT nasal spray USE 2 SPRAYS INTO EACH NOSTRIL ONCE DAILY AS DIRECTED.   [DISCONTINUED] hydrOXYzine  (ATARAX ) 25 MG tablet Take 25 mg by mouth 3 (three) times daily as needed. (Patient not taking: Reported on 01/31/2024)   [DISCONTINUED] olopatadine  (PATANOL) 0.1 % ophthalmic solution Place 1 drop into  both eyes 2 (two) times daily.   [DISCONTINUED] Vitamin D , Ergocalciferol , 50000 units CAPS Take 1 each by mouth once a week.   No facility-administered medications prior to visit.    Review of Systems All negative Except see HPI       Objective    BP (!) 118/55 (BP Location: Left Arm, Patient Position: Sitting, Cuff Size: Normal)   Pulse (!) 56   Temp 98.1 F (36.7 C) (Oral)   Ht 5' 8 (1.727 m)   Wt 241 lb 11.2 oz (109.6 kg)   SpO2 99%   BMI 36.75 kg/m     Physical Exam Vitals reviewed.  Constitutional:      General: She is not in acute distress.    Appearance: Normal appearance. She is well-developed. She is not diaphoretic.  HENT:     Head: Normocephalic and atraumatic.  Eyes:     General: No scleral icterus.    Conjunctiva/sclera: Conjunctivae normal.  Neck:     Thyroid : No thyromegaly.  Cardiovascular:     Rate and Rhythm: Normal rate and regular rhythm.     Pulses: Normal pulses.     Heart sounds: Normal heart sounds. No murmur heard. Pulmonary:     Effort: Pulmonary effort is normal. No respiratory distress.     Breath sounds: Normal breath sounds. No wheezing, rhonchi or rales.  Musculoskeletal:     Cervical back: Neck supple.     Right lower leg: No edema.     Left lower leg: No edema.  Lymphadenopathy:     Cervical: No cervical adenopathy.  Skin:    General: Skin is warm and dry.     Findings: No rash.  Neurological:     Mental Status: She is alert and oriented to person, place, and time. Mental status is at baseline.  Psychiatric:        Mood and Affect: Mood normal.        Behavior: Behavior normal.      No results found for any visits on 03/25/24.      Assessment & Plan Chronic kidney disease stage 3 Chronic kidney disease stage 3 with slightly low kidney function. Discussed importance of hydration and monitoring kidney function, especially with diabetes management. Addressed potassium balance due to losartan  and furosemide . -  Continue current hydration regimen. - Monitor kidney function regularly. - Discuss furosemide  dosage with nephrologist at next visit. - Monitor potassium levels periodically.  Type 2 diabetes mellitus Chronic , assoc with CKD, HTN, gout, HLD Type 2 diabetes mellitus with recent HbA1c of 6.3. Discussed dietary lapses and emphasized diet and exercise for blood sugar management. - Continue current diet and exercise regimen. - Monitor blood sugar levels regularly.  Hypertension Chronic and stable managed with losartan  50 and furosemide   40. Blood pressure normalized, but diastolic pressure concerns noted. - Continue losartan  and furosemide . - Monitor blood pressure regularly. - Discuss any significant changes with cardiologist.  Gout Chronic Gout managed with allopurinol . Uric acid levels decreased to 6.7, goal is less than 6. Discussed allopurinol  dosage, decided to reassess in three months. - Continue current allopurinol  dosage. - Reassess uric acid levels in three months.  Hyperlipidemia Chornic Hyperlipidemia managed with rosuvastatin. Current management satisfactory. - Continue rosuvastatin 5.  Hypothyroidism Chronic and stable Hypothyroidism managed with levothyroxine . TSH levels indicate effective management. - Continue levothyroxine .  Asthma Asthma well-controlled with as-needed inhaler use. - Use inhaler as needed. - Monitor inhaler expiration dates.  Allergic rhinitis due to pollen Allergic rhinitis managed with Flonase . Running low on supply. - Refill Flonase  prescription.   Seasonal allergic rhinitis due to pollen  - fluticasone  (FLONASE ) 50 MCG/ACT nasal spray; USE 2 SPRAYS INTO EACH NOSTRIL ONCE DAILY AS DIRECTED.  Dispense: 16 g; Refill: 0   Hypokalemia Chronic On K supplements Will follow-up   No orders of the defined types were placed in this encounter.   Return in about 3 months (around 06/25/2024) for chronic disease f/u with labs.   The patient  was advised to call back or seek an in-person evaluation if the symptoms worsen or if the condition fails to improve as anticipated.  I discussed the assessment and treatment plan with the patient. The patient was provided an opportunity to ask questions and all were answered. The patient agreed with the plan and demonstrated an understanding of the instructions.  I, Velicia Dejager, PA-C have reviewed all documentation for this visit. The documentation on 03/25/2024  for the exam, diagnosis, procedures, and orders are all accurate and complete.  Jolynn Spencer, [facility], MMS [facility] [phone] (phone) [phone] (fax)  [facility]

## 2024-03-27 ENCOUNTER — Ambulatory Visit: Payer: PPO

## 2024-04-01 ENCOUNTER — Ambulatory Visit

## 2024-04-01 VITALS — Ht 68.0 in | Wt 238.0 lb

## 2024-04-01 DIAGNOSIS — Z0000 Encounter for general adult medical examination without abnormal findings: Secondary | ICD-10-CM

## 2024-04-01 NOTE — Progress Notes (Signed)
 Subjective:   [name] is a 68 y.o. who presents for a Medicare Wellness preventive visit.  As a reminder, Annual Wellness Visits don't include a physical exam, and some assessments may be limited, especially if this visit is performed virtually. We may recommend an in-person follow-up visit with your provider if needed.  Visit Complete: Virtual I connected with  [name] on 04/01/24 by a audio enabled telemedicine application and verified that I am speaking with the correct person using two identifiers.  Patient Location: Home  Provider Location: Office/Clinic  I discussed the limitations of evaluation and management by telemedicine. The patient expressed understanding and agreed to proceed.  Vital Signs: Because this visit was a virtual/telehealth visit, some criteria may be missing or patient reported. Any vitals not documented were not able to be obtained and vitals that have been documented are patient reported.    Persons Participating in Visit: Patient.  AWV Questionnaire: Yes: Patient Medicare AWV questionnaire was completed by the patient on 03/31/24; I have confirmed that all information answered by patient is correct and no changes since this date.  Cardiac Risk Factors include: advanced age (>41men, >63 women);dyslipidemia     Objective:    Today's Vitals   04/01/24 1210  Weight: 238 lb (108 kg)  Height: 5' 8 (1.727 m)   Body mass index is 36.19 kg/m.     04/01/2024   12:16 PM 03/21/2023    1:48 PM 05/16/2022    9:05 AM 05/12/2021    9:48 AM 09/16/2019    9:49 AM  Advanced Directives  Does Patient Have a Medical Advance Directive? No No No No No  Would patient like information on creating a medical advance directive? No - Patient declined  No - Patient declined No - Patient declined No - Patient declined    Current Medications (verified) Outpatient Encounter Medications as of 04/01/2024  Medication Sig   albuterol  (VENTOLIN  HFA) 108 (90  Base) MCG/ACT inhaler Inhale 2 puffs into the lungs every 6 (six) hours as needed.   allopurinol  (ZYLOPRIM ) 100 MG tablet Take 1 tablet (100 mg total) by mouth daily.   fluticasone  (FLONASE ) 50 MCG/ACT nasal spray USE 2 SPRAYS INTO EACH NOSTRIL ONCE DAILY AS DIRECTED.   furosemide  (LASIX ) 40 MG tablet TAKE 1 TABLET BY MOUTH DAILY   ibuprofen  (IBU) 600 MG tablet TAKE 1 TABLET BY MOUTH EVERY 8 HOURS AS NEEDED.   levothyroxine  (SYNTHROID ) 75 MCG tablet Take 1 tablet (75 mcg total) by mouth daily.   losartan  (COZAAR ) 50 MG tablet Take 1 tablet (50 mg total) by mouth daily.   montelukast  (SINGULAIR ) 10 MG tablet Take 1 tablet (10 mg total) by mouth at bedtime.   potassium chloride  (KLOR-CON  M) 10 MEQ tablet Take 10 mEq by mouth 2 (two) times daily.   rosuvastatin (CRESTOR) 5 MG tablet Take 5 mg by mouth daily.   No facility-administered encounter medications on file as of 04/01/2024.    Allergies (verified) Penicillins   History: Past Medical History:  Diagnosis Date   Allergy 1975   Penicillin   Broken arm    Cervical cancer (HCC)    Chronic kidney disease, stage III (moderate) (HCC)    Foot fracture 2013   Hiatal hernia    Hypopotassemia    OSA on CPAP    Other abnormal glucose    Pure hypercholesterolemia    Reflux esophagitis    Sleep apnea 2009   Thyroid  fullness    Thyroid  nodule  Unspecified vitamin D  deficiency    Past Surgical History:  Procedure Laterality Date   ABDOMINAL HYSTERECTOMY  2007   Due to cervical cancer   CHOLECYSTECTOMY  A long time ago!   ELBOW FRACTURE SURGERY Right    FOOT SURGERY Left 03/06/2010   FRACTURE SURGERY  2010   Not sure of date. Broken elbow   GALLBLADDER SURGERY     GANGLION CYST EXCISION     TOTAL VAGINAL HYSTERECTOMY     Family History  Problem Relation Age of Onset   Cancer Mother    Kidney disease Mother    Varicose Veins Mother    Heart attack Father 67       MI   Heart disease Father    CAD Father    Early death  Father    Breast cancer Maternal Grandmother    Hypertension Brother    Diabetes Brother    Social History   Socioeconomic History   Marital status: Married    Spouse name: Not on file   Number of children: 3   Years of education: Not on file   Highest education level: Some college, no degree  Occupational History    Comment: self employeed  Tobacco Use   Smoking status: Never   Smokeless tobacco: Never  Vaping Use   Vaping status: Never Used  Substance and Sexual Activity   Alcohol use: Yes    Alcohol/week: 0.0 - 1.0 standard drinks of alcohol    Comment: 1-2 some weeks   Drug use: No   Sexual activity: Not Currently    Birth control/protection: None  Other Topics Concern   Not on file  Social History Narrative   Not on file   Social Drivers of Health   Financial Resource Strain: Low Risk  (04/01/2024)   Overall Financial Resource Strain (CARDIA)    Difficulty of Paying Living Expenses: Not hard at all  Food Insecurity: No Food Insecurity (04/01/2024)   Hunger Vital Sign    Worried About Running Out of Food in the Last Year: Never true    Ran Out of Food in the Last Year: Never true  Transportation Needs: No Transportation Needs (04/01/2024)   PRAPARE - Administrator, Civil Service (Medical): No    Lack of Transportation (Non-Medical): No  Physical Activity: Sufficiently Active (04/01/2024)   Exercise Vital Sign    Days of Exercise per Week: 3 days    Minutes of Exercise per Session: 60 min  Recent Concern: Physical Activity - Insufficiently Active (01/29/2024)   Exercise Vital Sign    Days of Exercise per Week: 2 days    Minutes of Exercise per Session: 60 min  Stress: No Stress Concern Present (04/01/2024)   [employer] of Occupational Health - Occupational Stress Questionnaire    Feeling of Stress: Not at all  Social Connections: Socially Integrated (04/01/2024)   Social Connection and Isolation Panel    Frequency of Communication with  Friends and Family: More than three times a week    Frequency of Social Gatherings with Friends and Family: More than three times a week    Attends Religious Services: More than 4 times per year    Active Member of [employer] or Organizations: Yes    Attends Banker Meetings: 1 to 4 times per year    Marital Status: Married    Tobacco Counseling Counseling given: Not Answered    Clinical Intake:  Pre-visit preparation completed: Yes  Pain : No/denies  pain     BMI - recorded: 36.19 Nutritional Status: BMI > 30  Obese Nutritional Risks: None Diabetes: No  Lab Results  Component Value Date   HGBA1C 6.3 (H) 02/01/2024   HGBA1C 6.3 08/10/2023   HGBA1C 6.0 (A) 11/09/2022     How often do you need to have someone help you when you read instructions, pamphlets, or other written materials from your doctor or pharmacy?: 1 - Never  Interpreter Needed?: No  Information entered by :: Ellouise Haws, LPN   Activities of Daily Living     03/31/2024    4:34 PM 03/23/2024   10:19 PM  In your present state of health, do you have any difficulty performing the following activities:  Hearing? 0 0  Vision? 0 0  Difficulty concentrating or making decisions? 0 0  Walking or climbing stairs? 0 0  Dressing or bathing? 0 0  Doing errands, shopping? 0 0  Preparing Food and eating ? N N  Using the Toilet? N N  In the past six months, have you accidently leaked urine? N N  Do you have problems with loss of bowel control? N N  Managing your Medications? N N  Managing your Finances? N N  Housekeeping or managing your Housekeeping? N N    Patient Care Team: Ostwalt, Janna, PA-C as PCP - General (Physician Assistant) Dennise Capri, MD (Nephrology) Cleotilde Barrio, MD (Orthopedic Surgery) Milissa Hamming, MD as Referring Physician (Otolaryngology) Cathlyn Seal, MD (Dermatology) Pa, [facility] (Optometry)  I have updated your Care Teams any recent Medical  Services you may have received from other providers in the past year.     Assessment:   This is a routine wellness examination for Marvin.  Hearing/Vision screen Hearing Screening - Comments:: Pt denies any hearing issues  Vision Screening - Comments:: Wears rx glasses - up to date with routine eye exams with [facility]    Goals Addressed   None    Depression Screen     04/01/2024   12:13 PM 01/31/2024    2:01 PM 03/21/2023    1:45 PM 11/09/2022   10:20 AM 05/16/2022    9:03 AM 12/16/2021    9:32 AM 05/12/2021    9:46 AM  PHQ 2/9 Scores  PHQ - 2 Score 0 0 0 0 0 0 0  PHQ- 9 Score  1  0 0 1     Fall Risk     03/31/2024    4:34 PM 03/23/2024   10:19 PM 01/31/2024    2:01 PM 03/21/2023    1:33 PM 11/09/2022   10:20 AM  Fall Risk   Falls in the past year? 0 0 0 0 0  Number falls in past yr: 0 0 0 0 0  Injury with Fall? 0 0 0 0 0  Risk for fall due to : No Fall Risks  No Fall Risks No Fall Risks No Fall Risks  Follow up Falls prevention discussed  Falls evaluation completed Falls prevention discussed;Education provided Falls evaluation completed    MEDICARE RISK AT HOME:  Medicare Risk at Home Any stairs in or around the home?: (Patient-Rptd) Yes If so, are there any without handrails?: (Patient-Rptd) No Home free of loose throw rugs in walkways, pet beds, electrical cords, etc?: (Patient-Rptd) Yes Adequate lighting in your home to reduce risk of falls?: (Patient-Rptd) Yes Life alert?: (Patient-Rptd) No Use of a cane, walker or w/c?: (Patient-Rptd) No Grab bars in the bathroom?: (Patient-Rptd) Yes Shower  chair or bench in shower?: (Patient-Rptd) Yes Elevated toilet seat or a handicapped toilet?: (Patient-Rptd) Yes  TIMED UP AND GO:  Was the test performed?  No  Cognitive Function: 6CIT completed        04/01/2024   12:16 PM 03/21/2023    1:50 PM 05/16/2022    9:07 AM  6CIT Screen  What Year? 0 points 0 points 0 points  What month? 0 points 0 points 0  points  What time? 0 points 0 points 0 points  Count back from 20 0 points 0 points 0 points  Months in reverse 0 points 0 points 0 points  Repeat phrase 0 points 0 points 0 points  Total Score 0 points 0 points 0 points    Immunizations Immunization History  Administered Date(s) Administered   INFLUENZA, HIGH DOSE SEASONAL PF 10/08/2018   Influenza Split 04/19/2006, 07/17/2009   Influenza,inj,Quad PF,6+ Mos 02/13/2013, 06/24/2015   Influenza-Unspecified 04/06/2018, 10/08/2018   PFIZER(Purple Top)SARS-COV-2 Vaccination 07/29/2019, 08/20/2019   Pneumococcal Conjugate-13 06/24/2015   Pneumococcal Polysaccharide-23 02/13/2013   Tdap 03/27/2008, 10/08/2018   Zoster, Live 08/03/2012    Screening Tests Health Maintenance  Topic Date Due   FOOT EXAM  Never done   Mammogram  08/24/2022   OPHTHALMOLOGY EXAM  03/13/2024   COVID-19 Vaccine (3 - 2025-26 season) 04/10/2024 (Originally 02/05/2024)   Zoster Vaccines- Shingrix (1 of 2) 06/25/2024 (Originally 06/29/2003)   Influenza Vaccine  09/03/2024 (Originally 01/05/2024)   Pneumococcal Vaccine: 50+ Years (3 of 3 - PCV20 or PCV21) 03/25/2025 (Originally 02/13/2018)   Diabetic kidney evaluation - Urine ACR  08/14/2027 (Originally 06/29/1973)   HEMOGLOBIN A1C  08/03/2024   Fecal DNA (Cologuard)  01/18/2025   Diabetic kidney evaluation - eGFR measurement  01/31/2025   Medicare Annual Wellness (AWV)  04/01/2025   DEXA SCAN  08/24/2026   DTaP/Tdap/Td (3 - Td or Tdap) 10/07/2028   Hepatitis C Screening  Completed   Meningococcal B Vaccine  Aged Out   Colonoscopy  Discontinued    Health Maintenance Items Addressed: See Nurse Notes at the end of this note, Pt stated mammogram scheduled for 06/2024  Additional Screening:  Vision Screening: Recommended annual ophthalmology exams for early detection of glaucoma and other disorders of the eye. Is the patient up to date with their annual eye exam?  No  Who is the provider or what is the name of the  office in which the patient attends annual eye exams? [facility] pt stated appt next week   Dental Screening: Recommended annual dental exams for proper oral hygiene  Community Resource Referral / Chronic Care Management: CRR required this visit?  No   CCM required this visit?  No   Plan:    I have personally reviewed and noted the following in the patient's chart:   Medical and social history Use of alcohol, tobacco or illicit drugs  Current medications and supplements including opioid prescriptions. Patient is not currently taking opioid prescriptions. Functional ability and status Nutritional status Physical activity Advanced directives List of other physicians Hospitalizations, surgeries, and ER visits in previous 12 months Vitals Screenings to include cognitive, depression, and falls Referrals and appointments  In addition, I have reviewed and discussed with patient certain preventive protocols, quality metrics, and best practice recommendations. A written personalized care plan for preventive services as well as general preventive health recommendations were provided to patient.   Ellouise VEAR Haws, LPN   [id number]/[id number]   After Visit Summary: (MyChart) Due to this being  a telephonic visit, the after visit summary with patients personalized plan was offered to patient via MyChart   Notes: Pt states diastolic pressure has been reading low as it did upon last office visit. Patient  will leave a my chart message to discuss denies any pain, fatigue dizziness or discomfort at this time

## 2024-04-01 NOTE — Patient Instructions (Signed)
 [name],  Thank you for taking the time for your Medicare Wellness Visit. I appreciate your continued commitment to your health goals. Please review the care plan we discussed, and feel free to reach out if I can assist you further.  Medicare recommends these wellness visits once per year to help you and your care team stay ahead of potential health issues. These visits are designed to focus on prevention, allowing your provider to concentrate on managing your acute and chronic conditions during your regular appointments.  Please note that Annual Wellness Visits do not include a physical exam. Some assessments may be limited, especially if the visit was conducted virtually. If needed, we may recommend a separate in-person follow-up with your provider.  Ongoing Care Seeing your primary care provider every 3 to 6 months helps us  monitor your health and provide consistent, personalized care.   Referrals If a referral was made during today's visit and you haven't received any updates within two weeks, please contact the referred provider directly to check on the status.  Recommended Screenings:  Health Maintenance  Topic Date Due   Complete foot exam   Never done   Breast Cancer Screening  08/24/2022   Medicare Annual Wellness Visit  03/20/2024   Eye exam for diabetics  03/13/2024   COVID-19 Vaccine (3 - 2025-26 season) 04/10/2024*   Zoster (Shingles) Vaccine (1 of 2) 06/25/2024*   Flu Shot  09/03/2024*   Pneumococcal Vaccine for age over 27 (3 of 3 - PCV20 or PCV21) 03/25/2025*   Yearly kidney health urinalysis for diabetes  08/14/2027*   Hemoglobin A1C  08/03/2024   Cologuard (Stool DNA test)  01/18/2025   Yearly kidney function blood test for diabetes  01/31/2025   DEXA scan (bone density measurement)  08/24/2026   DTaP/Tdap/Td vaccine (3 - Td or Tdap) 10/07/2028   Hepatitis C Screening  Completed   Meningitis B Vaccine  Aged Out   Colon Cancer Screening  Discontinued  *Topic was  postponed. The date shown is not the original due date.       04/01/2024   12:16 PM  Advanced Directives  Does Patient Have a Medical Advance Directive? No  Would patient like information on creating a medical advance directive? No - Patient declined   Advance Care Planning is important because it: Ensures you receive medical care that aligns with your values, goals, and preferences. Provides guidance to your family and loved ones, reducing the emotional burden of decision-making during critical moments.  Vision: Annual vision screenings are recommended for early detection of glaucoma, cataracts, and diabetic retinopathy. These exams can also reveal signs of chronic conditions such as diabetes and high blood pressure.  Dental: Annual dental screenings help detect early signs of oral cancer, gum disease, and other conditions linked to overall health, including heart disease and diabetes.  Please see the attached documents for additional preventive care recommendations.

## 2024-04-04 ENCOUNTER — Other Ambulatory Visit: Payer: Self-pay | Admitting: Physician Assistant

## 2024-04-04 DIAGNOSIS — Z1231 Encounter for screening mammogram for malignant neoplasm of breast: Secondary | ICD-10-CM

## 2024-04-09 ENCOUNTER — Telehealth: Payer: Self-pay | Admitting: Cardiovascular Disease

## 2024-04-09 NOTE — Telephone Encounter (Signed)
 Pt c/o BP issue: STAT if pt c/o blurred vision, one-sided weakness or slurred speech.  STAT if BP is GREATER than 180/120 TODAY.  STAT if BP is LESS than 90/60 and SYMPTOMATIC TODAY  1. What is your BP concern? hypotension  2. Have you taken any BP medication today? losartan    3. What are your last 5 BP readings? Lowest 114/53 pulse 68  143/64 pulse 60   4. Are you having any other symptoms (ex. Dizziness, headache, blurred vision, passed out)? No mild headache   Please advise

## 2024-04-09 NOTE — Telephone Encounter (Signed)
 Returned pt's call; verified pt by using 2 identifiers; pt called stating that she had an appointment with her PCP and they brought up the fact that her diastolic BP was in the low 50's;  she stated that she started keeping a record of both her BP and HR since 10/21; readings have been Systolic BP 114-143 with Diastolic reading 49-39d: HR 50-60s; pt states that she stays well hydrated; only complaint is a mild headache for the past week or so; eyesight is hazy at time, but overall she feels ok;  explained to patient that I will reach out to Dr. Gollan for further advise and possibly she may need medication adjustments.  Pt thanked me for returning her call.

## 2024-04-10 ENCOUNTER — Other Ambulatory Visit: Payer: Self-pay | Admitting: Physician Assistant

## 2024-04-10 DIAGNOSIS — E034 Atrophy of thyroid (acquired): Secondary | ICD-10-CM

## 2024-04-11 NOTE — Telephone Encounter (Signed)
Patient is following up requesting a call back

## 2024-04-11 NOTE — Telephone Encounter (Signed)
 Called and spoke with the patient. The patient reports that she was evaluated by her PCP approximately two weeks ago, during which her PCP expressed concern about a diastolic blood pressure reading of 55 mmHg. Since then, the patient has been monitoring her blood pressure at home and notes that her diastolic readings are consistently in the 50s, while her systolic readings are typically in the 120-130s range. She denies experiencing any symptoms. The patient mentions having intermittent headaches over the past couple of weeks, which she attributes to allergies.

## 2024-04-12 NOTE — Telephone Encounter (Signed)
 Spoke with patient on the phone. Notified patient of the following from Dr. Gollan.  I would continue to monitor blood pressure for now She could check blood pressure sitting then check blood pressure standing see if it drops If there is a big drop in pressure we may need to cut losartan  in half Thx TGollan  Patient verbalizes understanding.

## 2024-04-15 DIAGNOSIS — H2513 Age-related nuclear cataract, bilateral: Secondary | ICD-10-CM | POA: Diagnosis not present

## 2024-04-23 ENCOUNTER — Other Ambulatory Visit: Payer: Self-pay | Admitting: Physician Assistant

## 2024-04-23 DIAGNOSIS — R601 Generalized edema: Secondary | ICD-10-CM

## 2024-04-23 NOTE — Progress Notes (Signed)
 Pharmacy Quality Measure Review  This patient is appearing on a report for being at risk of failing the adherence measure for cholesterol (statin) medications this calendar year.   Medication: rosuvastatin Last fill date: 04/18/24 for 90 day supply  Insurance report was not up to date. No action needed at this time.   Sevilla Murtagh E. Marsh, PharmD, CPP Clinical Pharmacist [facility] [phone]

## 2024-04-29 ENCOUNTER — Other Ambulatory Visit: Payer: Self-pay | Admitting: Physician Assistant

## 2024-04-29 DIAGNOSIS — R601 Generalized edema: Secondary | ICD-10-CM

## 2024-04-29 DIAGNOSIS — J4520 Mild intermittent asthma, uncomplicated: Secondary | ICD-10-CM

## 2024-04-29 DIAGNOSIS — J301 Allergic rhinitis due to pollen: Secondary | ICD-10-CM

## 2024-04-29 DIAGNOSIS — M1A09X0 Idiopathic chronic gout, multiple sites, without tophus (tophi): Secondary | ICD-10-CM

## 2024-04-29 NOTE — Telephone Encounter (Unsigned)
 Copied from CRM #[id number]. Topic: Clinical - Medication Refill >> Apr 29, 2024 10:53 AM Winona R wrote: Medication: She is doing good on this medication Pharmacy requested refill on 11/18. Pt is completely out out of the furosemide  (LASIX ) 40 MG tablet and Montelukast   but have about a week of Allopurinol   furosemide  (LASIX ) 40 MG tablet - She is out of this medication as of yesterday allopurinol  (ZYLOPRIM ) 100 MG tablet montelukast  (SINGULAIR ) 10 MG tablet [[id number]]   Has the patient contacted their pharmacy? Yes (Agent: If no, request that the patient contact the pharmacy for the refill. If patient does not wish to contact the pharmacy document the reason why and proceed with request.) (Agent: If yes, when and what did the pharmacy advise?)  This is the patient's preferred pharmacy:  [facility] - [place], [place] - [address] [place] [address] Phone: [phone] Fax: [phone]  Is this the correct pharmacy for this prescription? Yes If no, delete pharmacy and type the correct one.   Has the prescription been filled recently? Yes  Is the patient out of the medication? Yes  Has the patient been seen for an appointment in the last year OR does the patient have an upcoming appointment? Yes  Can we respond through MyChart? Yes  Agent: Please be advised that Rx refills may take up to 3 business days. We ask that you follow-up with your pharmacy.

## 2024-04-29 NOTE — Telephone Encounter (Signed)
 Please, let pt know that she needs to take lasix  twice a week, per cardiology 's recommendation until his further instructions.

## 2024-04-30 ENCOUNTER — Telehealth: Payer: Self-pay | Admitting: Cardiovascular Disease

## 2024-04-30 DIAGNOSIS — R601 Generalized edema: Secondary | ICD-10-CM

## 2024-04-30 MED ORDER — FUROSEMIDE 40 MG PO TABS: ORAL_TABLET | ORAL | 3 refills | Status: DC

## 2024-04-30 MED ORDER — MONTELUKAST SODIUM 10 MG PO TABS: 10.0000 mg | ORAL_TABLET | Freq: Every day | ORAL | 0 refills | Status: AC

## 2024-04-30 MED ORDER — ALLOPURINOL 100 MG PO TABS: 100.0000 mg | ORAL_TABLET | Freq: Every day | ORAL | 0 refills | Status: DC

## 2024-04-30 NOTE — Telephone Encounter (Signed)
 Pt c/o medication issue:  1. Name of Medication:   furosemide  (LASIX ) 40 MG tablet    2. How are you currently taking this medication (dosage and times per day)? Unknown   3. Are you having a reaction (difficulty breathing--STAT)? No   4. What is your medication issue? Pt would like a c/b to discuss medication and dose please advise

## 2024-04-30 NOTE — Telephone Encounter (Signed)
 Called and spoke with patient. All questions answered in regards to Lasix  recommendations. Refill sent to preferred pharmacy per patient request. Patient verbalizes appreciation for call.

## 2024-04-30 NOTE — Telephone Encounter (Signed)
 Requested Prescriptions  Pending Prescriptions Disp Refills   furosemide  (LASIX ) 40 MG tablet 90 tablet 0    Sig: Take 1 tablet twice per a week, per Dr. Gollian's recommendation     Cardiovascular:  Diuretics - Loop Failed - 04/30/2024 12:43 PM      Failed - Cr in normal range and within 180 days    Creatinine, Ser  Date Value Ref Range Status  02/01/2024 1.17 (H) 0.57 - 1.00 mg/dL Final   Creatinine, Urine  Date Value Ref Range Status  08/10/2023 128  Final         Failed - Mg Level in normal range and within 180 days    No results found for: MG       Passed - K in normal range and within 180 days    Potassium  Date Value Ref Range Status  02/01/2024 4.3 3.5 - 5.2 mmol/L Final         Passed - Ca in normal range and within 180 days    Calcium  Date Value Ref Range Status  02/01/2024 9.6 8.7 - 10.3 mg/dL Final         Passed - Na in normal range and within 180 days    Sodium  Date Value Ref Range Status  02/01/2024 141 134 - 144 mmol/L Final         Passed - Cl in normal range and within 180 days    Chloride  Date Value Ref Range Status  02/01/2024 103 96 - 106 mmol/L Final         Passed - Last BP in normal range    BP Readings from Last 1 Encounters:  03/25/24 (!) 118/55         Passed - Valid encounter within last 6 months    Recent Outpatient Visits           1 month ago Non-seasonal allergic rhinitis due to other allergic trigger   [facility] [place], [place], PA-C   3 months ago Vitamin D  deficiency   [place] [facility] [place], [place], PA-C       Future Appointments             In 1 month Gollan, Timothy J, MD [facility]             allopurinol  (ZYLOPRIM ) 100 MG tablet 90 tablet 0    Sig: Take 1 tablet (100 mg total) by mouth daily.     Endocrinology:  Gout Agents - allopurinol  Failed - 04/30/2024 12:43 PM      Failed - Cr in normal range and within 360 days     Creatinine, Ser  Date Value Ref Range Status  02/01/2024 1.17 (H) 0.57 - 1.00 mg/dL Final   Creatinine, Urine  Date Value Ref Range Status  08/10/2023 128  Final         Passed - Uric Acid in normal range and within 360 days    Uric Acid  Date Value Ref Range Status  02/01/2024 6.7 3.0 - 7.2 mg/dL Final    Comment:               Therapeutic target for gout patients: <6.0         Passed - Valid encounter within last 12 months    Recent Outpatient Visits           1 month ago Non-seasonal allergic rhinitis due to other allergic trigger  [facility] [place], [place], PA-C   3 months ago Vitamin D  deficiency   [facility] [place], [place], PA-C       Future Appointments             In 1 month Gollan, Evalene PARAS, MD [facility] at [facility] - CBC within normal limits and completed in the last 12 months    WBC  Date Value Ref Range Status  02/01/2024 6.3 3.4 - 10.8 x10E3/uL Final   RBC  Date Value Ref Range Status  02/01/2024 4.30 3.77 - 5.28 x10E6/uL Final  08/10/2023 4.31 3.87 - 5.11 Final   Hemoglobin  Date Value Ref Range Status  02/01/2024 12.3 11.1 - 15.9 g/dL Final   Hematocrit  Date Value Ref Range Status  02/01/2024 38.7 34.0 - 46.6 % Final   MCHC  Date Value Ref Range Status  02/01/2024 31.8 31.5 - 35.7 g/dL Final   [facility]  Date Value Ref Range Status  02/01/2024 28.6 26.6 - 33.0 pg Final   MCV  Date Value Ref Range Status  02/01/2024 90 79 - 97 fL Final   No results found for: PLTCOUNTKUC, LABPLAT, POCPLA RDW  Date Value Ref Range Status  02/01/2024 12.9 11.7 - 15.4 % Final          montelukast  (SINGULAIR ) 10 MG tablet 90 tablet 0    Sig: Take 1 tablet (10 mg total) by mouth at bedtime.     Pulmonology:  Leukotriene Inhibitors Passed - 04/30/2024 12:43 PM      Passed - Valid encounter within last 12 months    Recent Outpatient Visits           1  month ago Non-seasonal allergic rhinitis due to other allergic trigger    [facility] [place], [place], PA-C   3 months ago Vitamin D  deficiency   [facility] [place], [place], PA-C       Future Appointments             In 1 month Gollan, Evalene PARAS, MD [facility]

## 2024-05-09 ENCOUNTER — Ambulatory Visit
Admission: RE | Admit: 2024-05-09 | Discharge: 2024-05-09 | Disposition: A | Source: Ambulatory Visit | Attending: Physician Assistant

## 2024-05-09 DIAGNOSIS — Z1231 Encounter for screening mammogram for malignant neoplasm of breast: Secondary | ICD-10-CM | POA: Diagnosis not present

## 2024-05-19 ENCOUNTER — Ambulatory Visit: Payer: Self-pay | Admitting: Physician Assistant

## 2024-05-31 ENCOUNTER — Other Ambulatory Visit: Payer: Self-pay | Admitting: Physician Assistant

## 2024-06-16 NOTE — Progress Notes (Unsigned)
 Cardiology Office Note  Date:  06/17/2024   ID:  [name], DOB 1956-02-09, MRN [id number]  PCP:  Lori Channel, PA-C   Chief Complaint  Patient presents with   12 month follow up     Patient would like to have the CT Cardiac Scoring. Patient denies chest pain or shortness of breath.     HPI:  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, busy Spending time with grandchildren, travels to see them play sports  BP running little high 140 systolic  Tolerating Crestor  5 daily, dramatic improvement in her cholesterol numbers  Works at home Drinks lots of [employer] to take Lasix  daily Trace ankle swelling Sits at a computer at home when working  EKG personally reviewed by myself on todays visit EKG Interpretation Date/Time:  Monday June 17 2024 09:19:11 EST Ventricular Rate:  58 PR Interval:  196 QRS Duration:  106 QT Interval:  452 QTC Calculation: 443 R Axis:   13  Text Interpretation: Sinus bradycardia When compared with ECG of 16-Jun-2023 10:25, No significant change was found Confirmed by Perla Lye [phone]) on 06/17/2024 9:54:17 AM   Lab work reviewed A1c 6.3 Total cholesterol 131 LDL 54  Cardiac CTA 2014 with no coronary disease, no coronary calcification  Other past medical history reviewed Prior cardiac CT scan November 2008 showing coronary calcium  score of 0, no significant coronary artery disease, normal LV and RV size and systolic function  Prior echocardiogram September 2011 was essentially normal with ejection fraction greater than 55%, normal right ventricular systolic pressure   PMH:   has a past medical history of Allergy (1975), Broken arm, Cervical cancer (HCC),  Chronic kidney disease, stage III (moderate) (HCC), Foot fracture (2013), Hiatal hernia, Hypopotassemia, OSA on CPAP, Other abnormal glucose, Pure hypercholesterolemia, Reflux esophagitis, Sleep apnea (2009), Thyroid  fullness, Thyroid  nodule, and Unspecified vitamin D  deficiency.  PSH:    Past Surgical History:  Procedure Laterality Date   ABDOMINAL HYSTERECTOMY  2007   Due to cervical cancer   CHOLECYSTECTOMY  A long time ago!   ELBOW FRACTURE SURGERY Right    FOOT SURGERY Left 03/06/2010   FRACTURE SURGERY  2010   Not sure of date. Broken elbow   GALLBLADDER SURGERY     GANGLION CYST EXCISION     TOTAL VAGINAL HYSTERECTOMY      Current Outpatient Medications  Medication Sig Dispense Refill   albuterol  (VENTOLIN  HFA) 108 (90 Base) MCG/ACT inhaler Inhale 2 puffs into the lungs every 6 (six) hours as needed. 18 g 5   allopurinol  (ZYLOPRIM ) 100 MG tablet Take 1 tablet (100 mg total) by mouth daily. 90 tablet 0   fluticasone  (FLONASE ) 50 MCG/ACT nasal spray USE 2 SPRAYS INTO EACH NOSTRIL ONCE DAILY AS DIRECTED. 16 g 0   furosemide  (LASIX ) 40 MG tablet Take 40 MG (1 tablet) fives days weekly. 60 tablet 3   ibuprofen  (IBU) 600 MG tablet TAKE 1 TABLET BY MOUTH EVERY 8 HOURS AS NEEDED. 270 tablet 3   levothyroxine  (SYNTHROID ) 75 MCG tablet TAKE 1 TABLET EVERY DAY ON EMPTY STOMACHWITH A GLASS OF WATER AT LEAST 30-60 MINBEFORE BREAKFAST 90 tablet 0   losartan  (COZAAR ) 50 MG tablet Take 1 tablet (50 mg total) by mouth  daily. 90 tablet 1   montelukast  (SINGULAIR ) 10 MG tablet Take 1 tablet (10 mg total) by mouth at bedtime. 90 tablet 0   potassium chloride  (KLOR-CON  M) 10 MEQ tablet TAKE 2 TABLETS BY MOUTH ONCE DAILY 90 tablet 1   rosuvastatin  (CRESTOR ) 5 MG tablet Take 5 mg by mouth daily.     No current facility-administered medications for this visit.   Allergies:   Penicillins   Social History:  The patient  reports that she has never smoked. She has never used smokeless tobacco. She  reports current alcohol use. She reports that she does not use drugs.   Family History:   family history includes Breast cancer in her maternal grandmother; CAD in her father; Cancer in her mother; Diabetes in her brother; Early death in her father; Heart attack (age of onset: 59) in her father; Heart disease in her father; Hypertension in her brother; Kidney disease in her mother; Varicose Veins in her mother.   Review of Systems: Review of Systems  Constitutional: Negative.   HENT: Negative.    Respiratory: Negative.    Cardiovascular: Negative.   Gastrointestinal: Negative.   Musculoskeletal: Negative.   Neurological: Negative.   Psychiatric/Behavioral: Negative.    All other systems reviewed and are negative.   PHYSICAL EXAM: VS:  BP (!) 140/80 (BP Location: Left Arm, Patient Position: Sitting, Cuff Size: Large)   Pulse (!) 58   Ht 5' 8 (1.727 m)   Wt 241 lb (109.3 kg)   SpO2 97%   BMI 36.64 kg/m  , BMI Body mass index is 36.64 kg/m. Constitutional:  oriented to person, place, and time. No distress.  HENT:  Head: Grossly normal Eyes:  no discharge. No scleral icterus.  Neck: No JVD, no carotid bruits  Cardiovascular: Regular rate and rhythm, no murmurs appreciated Pulmonary/Chest: Clear to auscultation bilaterally, no wheezes or rales Abdominal: Soft.  no distension.  no tenderness.  Musculoskeletal: Normal range of motion Neurological:  normal muscle tone. Coordination normal. No atrophy Skin: Skin warm and dry Psychiatric: normal affect, pleasant  Recent Labs: 02/01/2024: ALT 18; BUN 19; Creatinine, Ser 1.17; Hemoglobin 12.3; Platelets 176; Potassium 4.3; Sodium 141; TSH 1.900    Lipid Panel Lab Results  Component Value Date   CHOL 131 02/01/2024   HDL 64 02/01/2024   LDLCALC 54 02/01/2024   TRIG 63 02/01/2024      Wt Readings from Last 3 Encounters:  06/17/24 241 lb (109.3 kg)  04/01/24 238 lb (108 kg)  03/25/24 241 lb 11.2 oz (109.6 kg)    ASSESSMENT  AND PLAN:  Problem List Items Addressed This Visit     Stage 3b chronic kidney disease (HCC)   Relevant Orders   EKG 12-Lead (Completed)   Elevated LDL cholesterol level - Primary   Relevant Orders   EKG 12-Lead (Completed)   Prediabetes   Class 2 severe obesity due to excess calories with serious comorbidity and body mass index (BMI) of 36.0 to 36.9 in adult    Essential hypertension Blood pressure running high at home, recommend she increase losartan  up to 50 twice daily  Chronic kidney disease Stressed importance of aggressive blood pressure control, will increase losartan  as above  Near syncope/vertigo No near-syncope or syncope  Hyperlipidemia Tolerating Crestor  5 daily She is requesting repeat calcium  scoring for risk stratification  Diabetes type 2 without complications We have encouraged continued exercise, careful diet management  A1c 6.3   Signed, Velinda Lunger, M.D., Ph.D. [facility]  [place], [place] [phone]

## 2024-06-17 ENCOUNTER — Encounter: Payer: Self-pay | Admitting: Cardiovascular Disease

## 2024-06-17 ENCOUNTER — Ambulatory Visit: Attending: Cardiovascular Disease | Admitting: Cardiovascular Disease

## 2024-06-17 VITALS — BP 140/80 | HR 58 | Ht 68.0 in | Wt 241.0 lb

## 2024-06-17 DIAGNOSIS — E66812 Obesity, class 2: Secondary | ICD-10-CM | POA: Diagnosis not present

## 2024-06-17 DIAGNOSIS — Z6836 Body mass index (BMI) 36.0-36.9, adult: Secondary | ICD-10-CM

## 2024-06-17 DIAGNOSIS — N1832 Chronic kidney disease, stage 3b: Secondary | ICD-10-CM | POA: Diagnosis not present

## 2024-06-17 DIAGNOSIS — R601 Generalized edema: Secondary | ICD-10-CM

## 2024-06-17 DIAGNOSIS — R7303 Prediabetes: Secondary | ICD-10-CM

## 2024-06-17 DIAGNOSIS — E7800 Pure hypercholesterolemia, unspecified: Secondary | ICD-10-CM

## 2024-06-17 MED ORDER — FUROSEMIDE 40 MG PO TABS: ORAL_TABLET | ORAL | 3 refills | Status: AC

## 2024-06-17 MED ORDER — LOSARTAN POTASSIUM 50 MG PO TABS: 50.0000 mg | ORAL_TABLET | Freq: Two times a day (BID) | ORAL | 3 refills | Status: AC

## 2024-06-17 MED ORDER — POTASSIUM CHLORIDE CRYS ER 10 MEQ PO TBCR: 20.0000 meq | EXTENDED_RELEASE_TABLET | Freq: Every day | ORAL | 3 refills | Status: AC

## 2024-06-17 MED ORDER — ROSUVASTATIN CALCIUM 5 MG PO TABS: 5.0000 mg | ORAL_TABLET | Freq: Every day | ORAL | 3 refills | Status: AC

## 2024-06-17 NOTE — Patient Instructions (Addendum)
 Medication Instructions:   Please increase the losartan  up to 50 mg twice a day  If you need a refill on your cardiac medications before your next appointment, please call your pharmacy.   Lab work: No new labs needed  Testing/Procedures: Your physician has recommended that you have CT Coronary Calcium  Score.  - $99 out of pocket cost at the time of your test - Call [phone] to schedule at your convenience.  [facility] [address] [place], [place] [address]   Coronary CalciumScan A coronary calcium  scan is an imaging test used to look for deposits of calcium  and other fatty materials (plaques) in the inner lining of the blood vessels of the heart (coronary arteries). These deposits of calcium  and plaques can partly clog and narrow the coronary arteries without producing any symptoms or warning signs. This puts a person at risk for a heart attack. This test can detect these deposits before symptoms develop. Tell a health care provider about: Any allergies you have. All medicines you are taking, including vitamins, herbs, eye drops, creams, and over-the-counter medicines. Any problems you or family members have had with anesthetic medicines. Any blood disorders you have. Any surgeries you have had. Any medical conditions you have. Whether you are pregnant or may be pregnant. What are the risks? Generally, this is a safe procedure. However, problems may occur, including: Harm to a pregnant woman and her unborn baby. This test involves the use of radiation. Radiation exposure can be dangerous to a pregnant woman and her unborn baby. If you are pregnant, you generally should not have this procedure done. Slight increase in the risk of cancer. This is because of the radiation involved in the test. What happens before the procedure? No preparation is needed for this procedure. What happens during the procedure? You will undress and  remove any jewelry around your neck or chest. You will put on a hospital gown. Sticky electrodes will be placed on your chest. The electrodes will be connected to an electrocardiogram (ECG) machine to record a tracing of the electrical activity of your heart. A CT scanner will take pictures of your heart. During this time, you will be asked to lie still and hold your breath for 2-3 seconds while a picture of your heart is being taken. The procedure may vary among health care providers and hospitals. What happens after the procedure? You can get dressed. You can return to your normal activities. It is up to you to get the results of your test. Ask your health care provider, or the department that is doing the test, when your results will be ready. Summary A coronary calcium  scan is an imaging test used to look for deposits of calcium  and other fatty materials (plaques) in the inner lining of the blood vessels of the heart (coronary arteries). Generally, this is a safe procedure. Tell your health care provider if you are pregnant or may be pregnant. No preparation is needed for this procedure. A CT scanner will take pictures of your heart. You can return to your normal activities after the scan is done. This information is not intended to replace advice given to you by your health care provider. Make sure you discuss any questions you have with your health care provider. Document Released: 11/19/2007 Document Revised: 04/11/2016 Document Reviewed: 04/11/2016 Elsevier Interactive Patient Education  2017 [employer].   Follow-Up: At [facility], you and your health needs are our priority.  As part  of our continuing mission to provide you with exceptional heart care, we have created designated Provider Care Teams.  These Care Teams include your primary Cardiologist (physician) and Advanced Practice Providers (APPs -  Physician Assistants and Nurse Practitioners) who all work together to provide  you with the care you need, when you need it.  You will need a follow up appointment in 12 months  Providers on your designated Care Team:   Lonni Meager, NP Bernardino Bring, PA-C [place]ence [place], [place]  COVID-19 Vaccine Information can be found at: [email or web address] For questions related to vaccine distribution or appointments, please email [email or web address] or call [phone].

## 2024-06-19 ENCOUNTER — Encounter: Payer: Self-pay | Admitting: Cardiovascular Disease

## 2024-07-11 ENCOUNTER — Other Ambulatory Visit: Payer: Self-pay | Admitting: Physician Assistant

## 2024-07-11 DIAGNOSIS — M1A09X0 Idiopathic chronic gout, multiple sites, without tophus (tophi): Secondary | ICD-10-CM
